# Patient Record
Sex: Male | Born: 1990 | Hispanic: No | Marital: Single | Smoking: Current every day smoker
Health system: Southern US, Community
[De-identification: ages and names within clinical notes are randomized; demographics above are authoritative.]

## PROBLEM LIST (undated history)

## (undated) DIAGNOSIS — T7840XA Allergy, unspecified, initial encounter: Secondary | ICD-10-CM

## (undated) DIAGNOSIS — B1920 Unspecified viral hepatitis C without hepatic coma: Secondary | ICD-10-CM

## (undated) HISTORY — DX: Allergy, unspecified, initial encounter: T78.40XA

---

## 2013-12-10 ENCOUNTER — Emergency Department (HOSPITAL_COMMUNITY)
Admission: EM | Admit: 2013-12-10 | Discharge: 2013-12-10 | Disposition: A | Discharge status: Home / Self Care | Payer: Self-pay | Attending: Emergency Medicine | Admitting: Emergency Medicine

## 2013-12-10 ENCOUNTER — Emergency Department (HOSPITAL_COMMUNITY): Payer: Self-pay

## 2013-12-10 ENCOUNTER — Encounter (HOSPITAL_COMMUNITY): Payer: Self-pay | Admitting: Emergency Medicine

## 2013-12-10 DIAGNOSIS — S62639A Displaced fracture of distal phalanx of unspecified finger, initial encounter for closed fracture: Secondary | ICD-10-CM | POA: Insufficient documentation

## 2013-12-10 DIAGNOSIS — S62639B Displaced fracture of distal phalanx of unspecified finger, initial encounter for open fracture: Secondary | ICD-10-CM

## 2013-12-10 DIAGNOSIS — S61319A Laceration without foreign body of unspecified finger with damage to nail, initial encounter: Secondary | ICD-10-CM

## 2013-12-10 DIAGNOSIS — Y9289 Other specified places as the place of occurrence of the external cause: Secondary | ICD-10-CM | POA: Insufficient documentation

## 2013-12-10 DIAGNOSIS — Y9389 Activity, other specified: Secondary | ICD-10-CM | POA: Insufficient documentation

## 2013-12-10 DIAGNOSIS — Z23 Encounter for immunization: Secondary | ICD-10-CM | POA: Insufficient documentation

## 2013-12-10 DIAGNOSIS — S6990XA Unspecified injury of unspecified wrist, hand and finger(s), initial encounter: Secondary | ICD-10-CM | POA: Insufficient documentation

## 2013-12-10 DIAGNOSIS — W208XXA Other cause of strike by thrown, projected or falling object, initial encounter: Secondary | ICD-10-CM | POA: Insufficient documentation

## 2013-12-10 DIAGNOSIS — S61209A Unspecified open wound of unspecified finger without damage to nail, initial encounter: Secondary | ICD-10-CM | POA: Insufficient documentation

## 2013-12-10 MED ORDER — HYDROMORPHONE HCL PF 1 MG/ML IJ SOLN
1.0000 mg | Freq: Once | INTRAMUSCULAR | Status: AC
  Administered 2013-12-10: 1 mg via INTRAVENOUS
  Filled 2013-12-10: qty 1

## 2013-12-10 MED ORDER — OXYCODONE-ACETAMINOPHEN 5-325 MG PO TABS: 1.0000 | ORAL_TABLET | ORAL | Status: DC | PRN

## 2013-12-10 MED ORDER — OXYCODONE-ACETAMINOPHEN 5-325 MG PO TABS
2.0000 | ORAL_TABLET | Freq: Once | ORAL | Status: AC
  Administered 2013-12-10: 2 via ORAL
  Filled 2013-12-10: qty 2

## 2013-12-10 MED ORDER — CEFAZOLIN SODIUM 1-5 GM-% IV SOLN
1.0000 g | Freq: Once | INTRAVENOUS | Status: AC
  Administered 2013-12-10: 1 g via INTRAVENOUS
  Filled 2013-12-10: qty 50

## 2013-12-10 MED ORDER — LIDOCAINE HCL 1 % IJ SOLN
INTRAMUSCULAR | Status: AC
  Administered 2013-12-10: 18:00:00
  Filled 2013-12-10: qty 20

## 2013-12-10 MED ORDER — CEPHALEXIN 500 MG PO CAPS: 500.0000 mg | ORAL_CAPSULE | Freq: Three times a day (TID) | ORAL | Status: DC

## 2013-12-10 MED ORDER — TETANUS-DIPHTH-ACELL PERTUSSIS 5-2.5-18.5 LF-MCG/0.5 IM SUSP
0.5000 mL | Freq: Once | INTRAMUSCULAR | Status: AC
  Administered 2013-12-10: 0.5 mL via INTRAMUSCULAR
  Filled 2013-12-10: qty 0.5

## 2013-12-10 NOTE — Consult Note (Signed)
ORTHOPAEDIC CONSULTATION HISTORY & PHYSICAL REQUESTING PHYSICIAN: Mirian Mo, MD  Chief Complaint: left long finger injury  HPI: Brent Bryant is a 23 y.o. 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 He was getting his more off the trailer, when a piece of steel pipe that was somewhat upright fell onto his left hand, smashing the tip. He has had his tetanus up dated, the digit has not been blocked, irrigated, nor evaluated with a bloodless field.  History reviewed. No pertinent past medical history. History reviewed. No pertinent past surgical history. History   Social History  . Marital Status: Single    Spouse Name: N/A    Number of Children: N/A  . Years of Education: N/A   Social History Main Topics  . Smoking status: Current Every Day Smoker  . Smokeless tobacco: None  . Alcohol Use: No  . Drug Use: Yes    Special: Marijuana  . Sexual Activity: None   Other Topics Concern  . None   Social History Narrative  . None   History reviewed. No pertinent family history. No Known Allergies Prior to Admission medications   Medication Sig Start Date End Date Taking? Authorizing Provider  ibuprofen (ADVIL,MOTRIN) 200 MG tablet Take 400 mg by mouth every 6 (six) hours as needed for headache.   Yes Historical Provider, MD  cephALEXin (KEFLEX) 500 MG capsule Take 1 capsule (500 mg total) by mouth 3 (three) times daily. 12/10/13   Jodi Marble, MD  oxyCODONE-acetaminophen (ROXICET) 5-325 MG per tablet Take 1-2 tablets by mouth every 4 (four) hours as needed. 12/10/13   Jodi Marble, MD   Dg Finger Middle Left  12/10/2013   CLINICAL DATA:  Crush injury to distal long finger  EXAM: LEFT MIDDLE FINGER 2+V  COMPARISON:  None.  FINDINGS: Comminuted fracture of the tuft of the third distal phalanx. There is associated irregularity of the overlying soft tissues concerning for open fracture. Remainder the visualized bones and joints are intact and unremarkable. Normal bony mineralization. No  lytic or blastic osseous lesion.  IMPRESSION: Comminuted fracture of the tuft of the third distal phalanx with associated soft tissue injury concerning for open fracture.   Electronically Signed   By: Malachy Moan M.D.   On: 12/10/2013 15:53    Positive ROS: All other systems have been reviewed and were otherwise negative with the exception of those mentioned in the HPI and as above.  Physical Exam: Vitals: Refer to EMR. Constitutional:  WD, WN, NAD HEENT:  NCAT, EOMI Neuro/Psych:  Alert & oriented to person, place, and time; appropriate mood & affect Lymphatic: No generalized extremity edema or lymphadenopathy Extremities / MSK:  The extremities are normal with respect to appearance, ranges of motion, joint stability, muscle strength/tone, sensation, & perfusion except as otherwise noted:  There is a transverse laceration across the nailbed, midway between the base of the tip. It traverses the sterile matrix. It extends into the ulnar nail fold. There are smashed bone fragments of the tuft within the tip. There is no nail plate present.  Assessment: Left long finger tip smashed with nailbed/skin lacerations and underlying comminuted tuft fracture  Plan: I instilled a digital block with plain lidocaine. Applied a tourniquet to the base digit and then washed copiously and a running water. After evaluating the extent of damage, prepped the finger with Betadine and repaired the soft tissues to include the nail bed and the adjacent normal skin with 6-0 chromic. Tourniquet was released, a dressing with a tongue blade  splint applied, and I ordered a dose of IV antibiotics. He'll be discharged with pain medicines, work restrictions, oral antibiotics for 5 days, and with followup planned for next week.  Cliffton Astersavid A. Janee Mornhompson, MD      [facility]thopaedic & Hand Surgery [facility] [address] [place], [place]  [id number] Office: [phone] Mobile: [phone]

## 2013-12-10 NOTE — ED Notes (Signed)
Pt yelling and screaming for pain meds.  Pt was refusing IV needle and wanted IM short. RN able to convince for IV meds.

## 2013-12-10 NOTE — ED Notes (Signed)
Pt states while at work, sharp "plate" fell on left middle finger.  Laceration.  Bleeding controlled.

## 2013-12-10 NOTE — ED Provider Notes (Signed)
CSN: [id number]     Arrival date & time 12/10/13  1447 History   First MD Initiated Contact with Patient 12/10/13 1455     Chief Complaint  Patient presents with  . Hand Injury     (Consider location/radiation/quality/duration/timing/severity/associated sxs/prior Treatment) Patient is a 23 y.o. male presenting with hand injury. The history is provided by the patient and medical records.  Hand Injury  This is a 23 y.o. M with no significant PMH presenting to the ED for left middle finger injury that occurred just PTA.  Pt was at work when a metal pipe fell onto his distal left middle finger.  He sustained a laceration to distal finger, crossing through fingernail.  He denies numbness or paresthesias of finger, but significant pain.  Patient is right hand dominant.  Last PO intake around 1300 this afternoon.  Date of last tetanus unknown.  No past medical history on file. No past surgical history on file. No family history on file. History  Substance Use Topics  . Smoking status: Not on file  . Smokeless tobacco: Not on file  . Alcohol Use: Not on file    Review of Systems  Musculoskeletal: Positive for arthralgias.  Skin: Positive for wound.  All other systems reviewed and are negative.     Allergies  Review of patient's allergies indicates not on file.  Home Medications   Prior to Admission medications   Not on File   BP 114/86  Pulse 83  Temp(Src) 97.6 F (36.4 C) (Oral)  Resp 18  SpO2 100%  Physical Exam  Nursing note and vitals reviewed. Constitutional: He is oriented to person, place, and time. He appears well-developed and well-nourished. No distress.  HENT:  Head: Normocephalic and atraumatic.  Mouth/Throat: Oropharynx is clear and moist.  Eyes: Conjunctivae and EOM are normal. Pupils are equal, round, and reactive to light.  Neck: Normal range of motion. Neck supple.  Cardiovascular: Normal rate, regular rhythm and normal heart sounds.     Pulmonary/Chest: Effort normal and breath sounds normal. No respiratory distress. He has no wheezes.  Musculoskeletal: Normal range of motion.       Left hand: He exhibits tenderness and laceration. He exhibits normal range of motion, normal capillary refill and no deformity. Normal sensation noted.       Hands:  2cm laceration of dorsal distal left middle finger through fingernail; no gross bony deformity; full flexion/extension of finger maintained; hand NVI  Neurological: He is alert and oriented to person, place, and time.  Skin: Skin is warm and dry. He is not diaphoretic.  Psychiatric: He has a normal mood and affect.    ED Course  Procedures (including critical care time) Labs Review Labs Reviewed - No data to display  Imaging Review Dg Finger Middle Left  12/10/2013   CLINICAL DATA:  Crush injury to distal long finger  EXAM: LEFT MIDDLE FINGER 2+V  COMPARISON:  None.  FINDINGS: Comminuted fracture of the tuft of the third distal phalanx. There is associated irregularity of the overlying soft tissues concerning for open fracture. Remainder the visualized bones and joints are intact and unremarkable. Normal bony mineralization. No lytic or blastic osseous lesion.  IMPRESSION: Comminuted fracture of the tuft of the third distal phalanx with associated soft tissue injury concerning for open fracture.   Electronically Signed   By: Malachy Moan M.D.   On: 12/10/2013 15:53     EKG Interpretation None      MDM   Final diagnoses:  Open fracture of tuft of distal phalanx of finger, initial encounter  Nailbed laceration, finger, initial encounter   Tetanus updated. X-ray revealing comminuted tuft fracture. Given overlying laceration through nail bed, concern for open fracture. On call hand surgery, Dr. Janee Mornhompson, was consulted-- he has evaluated pt in the ED and performed repair.  Ancef given in ED.  Patient will be discharged home with keflex and pain meds.  Will FU in office next  week with Dr. Janee Mornhompson.  Discussed plan with patient, he/she acknowledged understanding and agreed with plan of care.  Return precautions given for new or worsening symptoms.  Garlon HatchetLisa M Kijuana Ruppel, PA-C 12/10/13 2249

## 2013-12-10 NOTE — Discharge Instructions (Signed)
Keep bandage on, clean and dry until you return to the office. No heavy work with left hand

## 2013-12-10 NOTE — Progress Notes (Signed)
[facility] Community Liaison Stacy,  ° °Provided pt with a list of primary care resources and a [facility] Card application to help patient establish primary care.  °

## 2013-12-10 NOTE — ED Provider Notes (Signed)
Medical screening examination/treatment/procedure(s) were conducted as a shared visit with non-physician practitioner(s) and myself.  I personally evaluated the patient during the encounter.  CV/pulm/abd exam unremarkable.  MSK with nailbed lac, consistent with Tufts fx.  Hand consulted. Will fu with Hand    EKG Interpretation None          Mirian MoMatthew Emori Mumme, MD 12/11/13 1325

## 2013-12-14 NOTE — ED Provider Notes (Signed)
Medical screening examination/treatment/procedure(s) were conducted as a shared visit with non-physician practitioner(s) and myself.  I personally evaluated the patient during the encounter.   EKG Interpretation None       I performed an examination on the patient including cardiac, pulmonary, and gi systems which were unremarkable.  He presents today with a nailbed laceration.  Hand was consulted and dispo'ed the patient.  DC home in stable condition.    Mirian MoMatthew Arlin Savona, MD 12/14/13 [phone]

## 2014-10-05 ENCOUNTER — Emergency Department (HOSPITAL_COMMUNITY)
Admission: EM | Admit: 2014-10-05 | Discharge: 2014-10-05 | Disposition: A | Discharge status: Home / Self Care | Payer: Medicaid Other | Attending: Emergency Medicine | Admitting: Emergency Medicine

## 2014-10-05 ENCOUNTER — Encounter (HOSPITAL_COMMUNITY): Payer: Self-pay | Admitting: Emergency Medicine

## 2014-10-05 ENCOUNTER — Emergency Department (HOSPITAL_COMMUNITY): Payer: Medicaid Other

## 2014-10-05 DIAGNOSIS — S99922A Unspecified injury of left foot, initial encounter: Secondary | ICD-10-CM | POA: Insufficient documentation

## 2014-10-05 DIAGNOSIS — Z8781 Personal history of (healed) traumatic fracture: Secondary | ICD-10-CM | POA: Diagnosis not present

## 2014-10-05 DIAGNOSIS — S99912A Unspecified injury of left ankle, initial encounter: Secondary | ICD-10-CM | POA: Insufficient documentation

## 2014-10-05 DIAGNOSIS — Y939 Activity, unspecified: Secondary | ICD-10-CM | POA: Insufficient documentation

## 2014-10-05 DIAGNOSIS — Z720 Tobacco use: Secondary | ICD-10-CM | POA: Insufficient documentation

## 2014-10-05 DIAGNOSIS — Y929 Unspecified place or not applicable: Secondary | ICD-10-CM | POA: Diagnosis not present

## 2014-10-05 DIAGNOSIS — Y999 Unspecified external cause status: Secondary | ICD-10-CM | POA: Diagnosis not present

## 2014-10-05 DIAGNOSIS — X58XXXA Exposure to other specified factors, initial encounter: Secondary | ICD-10-CM | POA: Diagnosis not present

## 2014-10-05 MED ORDER — IBUPROFEN 800 MG PO TABS
800.0000 mg | ORAL_TABLET | Freq: Once | ORAL | Status: AC
Start: 2014-10-05 — End: 2014-10-05
  Administered 2014-10-05: 800 mg via ORAL
  Filled 2014-10-05: qty 1

## 2014-10-05 NOTE — ED Notes (Signed)
Pt c/o L foot pain x 1 week. [place] is from [place] and broke a bone in his foot about 1 month ago. Pt was driving down here and got caught in the rain which he sts "warped" his cast. Pt removed the cast and has had pain in his foot ever since. Pt A&Ox4 and ambulatory. Pt has good ROM and can move toes. Denies numbness and tingling. Pt had an appointment in July to get cast removed.

## 2014-10-05 NOTE — ED Provider Notes (Signed)
CSN: [id number]     Arrival date & time 10/05/14  1110 History  This chart was scribed for [name], PA-C, working with Samuel JesterKathleen McManus, DO by Chestine SporeSoijett Blue, ED Scribe. The patient was seen in room WTR9/WTR9 at 12:06 PM.    Chief Complaint  Patient presents with  . Foot Injury      The history is provided by the patient. No language interpreter was used.     HPI Comments: Brent Bryant is a 24 y.o. male who presents to the Emergency Department complaining of left foot injury onset 1 week. He reports that he is from [place] and he broke his foot a couple weeks ago and he removed his cast because it got wet in the rain. Pt has an appointment 11/03/14 to get the cast removed. Pt has been ambulatory but not without pain. He states that he is having associated symptoms of mild joint swelling. He denies numbness, tingling, and any other symptoms.  History reviewed. No pertinent past medical history. History reviewed. No pertinent past surgical history. No family history on file. History  Substance Use Topics  . Smoking status: Current Every Day Smoker  . Smokeless tobacco: Not on file  . Alcohol Use: No    Review of Systems  Musculoskeletal: Positive for joint swelling (mild swelling of the left ankle) and arthralgias (left foot and ankle).  Skin: Negative for color change and wound.  Neurological: Negative for numbness.      Allergies  Vicodin  Home Medications   Prior to Admission medications   Medication Sig Start Date End Date Taking? Authorizing Provider  oxyCODONE-acetaminophen (ROXICET) 5-325 MG per tablet Take 1-2 tablets by mouth every 4 (four) hours as needed. Patient taking differently: Take 1-2 tablets by mouth every 4 (four) hours as needed for moderate pain.  12/10/13  Yes Mack Hookavid Thompson, MD  cephALEXin (KEFLEX) 500 MG capsule Take 1 capsule (500 mg total) by mouth 3 (three) times daily. Patient not taking: Reported on 10/05/2014 12/10/13   Mack Hookavid Thompson, MD    BP 111/76 mmHg  Pulse 87  Temp(Src) 97.9 F (36.6 C) (Oral)  Resp 18  Ht 5\' 9"  (1.753 m)  Wt 140 lb (63.504 kg)  BMI 20.67 kg/m2  SpO2 100% Physical Exam  Constitutional: He is oriented to person, place, and time. He appears well-developed and well-nourished. No distress.  HENT:  Head: Normocephalic and atraumatic.  Eyes: EOM are normal.  Neck: Neck supple. No tracheal deviation present.  Cardiovascular: Normal rate.    Pulses intact.  Pulmonary/Chest: Effort normal. No respiratory distress.  Musculoskeletal:       Left lower leg: He exhibits tenderness.       Left foot: There is decreased range of motion, tenderness and swelling (minimal).  Tenderness along the distal tibia and dorsum of the left foot. Tender in the heel. Minimal swelling, no stepoff sensation.  ROM limited due to pain.  Neurological: He is alert and oriented to person, place, and time.  Skin: Skin is warm and dry.  Psychiatric: He has a normal mood and affect. His behavior is normal.  Nursing note and vitals reviewed.   ED Course  Procedures (including critical care time) DIAGNOSTIC STUDIES: Oxygen Saturation is 100% on RA, nl by my interpretation.    COORDINATION OF CARE: 12:09 PM-Discussed treatment plan which includes left foot x-ray and left ankle x-ray with pt at bedside and pt agreed to plan.   Labs Review Labs Reviewed - No data to display  Imaging Review Dg Ankle Complete Left  10/05/2014   CLINICAL DATA:  History of prior fracture. Removed cast because ago wet. Dorsal foot pain.  EXAM: LEFT FOOT - COMPLETE 3+ VIEW; LEFT ANKLE COMPLETE - 3+ VIEW  COMPARISON:  None.  FINDINGS: The joint spaces are maintained. No bullet or ankle fractures are identified.  IMPRESSION: No acute bony findings.   Electronically Signed   By: Rudie Meyer M.D.   On: 10/05/2014 13:04   Dg Foot Complete Left  10/05/2014   CLINICAL DATA:  History of prior fracture. Removed cast because ago wet. Dorsal foot pain.  EXAM:  LEFT FOOT - COMPLETE 3+ VIEW; LEFT ANKLE COMPLETE - 3+ VIEW  COMPARISON:  None.  FINDINGS: The joint spaces are maintained. No bullet or ankle fractures are identified.  IMPRESSION: No acute bony findings.   Electronically Signed   By: Rudie Meyer M.D.   On: 10/05/2014 13:04     EKG Interpretation None      MDM   Final diagnoses:  None    Patient X-Ray negative for obvious fracture or dislocation. Pain managed in ED. Pt advised to follow up with orthopedics if symptoms persist for possibility of missed fracture diagnosis. Patient given brace while in ED, conservative therapy recommended and discussed. Patient will be dc home & is agreeable with above plan.   I personally performed the services described in this documentation, which was scribed in my presence. The recorded information has been reviewed and is accurate.      Arthor Captain, PA-C 10/05/14 1316  Samuel Jester, DO 10/09/14 1453

## 2014-10-05 NOTE — Discharge Instructions (Signed)

## 2016-01-07 ENCOUNTER — Emergency Department (HOSPITAL_COMMUNITY)
Admission: EM | Admit: 2016-01-07 | Discharge: 2016-01-07 | Disposition: A | Discharge status: Home / Self Care | Payer: Medicaid Other | Attending: Emergency Medicine | Admitting: Emergency Medicine

## 2016-01-07 ENCOUNTER — Encounter (HOSPITAL_COMMUNITY): Payer: Self-pay | Admitting: Emergency Medicine

## 2016-01-07 DIAGNOSIS — F17210 Nicotine dependence, cigarettes, uncomplicated: Secondary | ICD-10-CM | POA: Insufficient documentation

## 2016-01-07 DIAGNOSIS — L0231 Cutaneous abscess of buttock: Secondary | ICD-10-CM | POA: Insufficient documentation

## 2016-01-07 MED ORDER — SULFAMETHOXAZOLE-TRIMETHOPRIM 800-160 MG PO TABS: 1.0000 | ORAL_TABLET | Freq: Two times a day (BID) | ORAL | 0 refills | Status: DC

## 2016-01-07 NOTE — Discharge Instructions (Signed)
Keep wound clean and dry. Apply warm compresses to affected area throughout the day, or perform warm sitz baths, at least 4 times daily. Take antibiotic until it is finished. Take motrin as needed for pain. [facility] in 5-7 days for wound recheck and to establish medical care. Monitor area for signs of infection to include, but not limited to: increasing pain, spreading redness, drainage/pus, worsening swelling, or fevers. Return to emergency department for emergent changing or worsening symptoms.

## 2016-01-07 NOTE — ED Provider Notes (Signed)
[facility]-EMERGENCY DEPT Provider Note   CSN: [id number] Arrival date & time: 01/07/16  1632  By signing my name below, I, Soijett Blue, attest that this documentation has been prepared under the direction and in the presence of Levi StraussMercedes Camprubi-Soms, VF CorporationPA-C Electronically Signed: Soijett Blue, ED Scribe. 01/07/16. 4:58 PM.   History   Chief Complaint Chief Complaint  Patient presents with  . Abscess    l/buttock x 2 days    HPI Brent Bryant is a 25 y.o. male who presents to the Emergency Department complaining of abscess to left buttocks onset yesterday. Pt describes his left buttock abscess pain as 7-8/10, constant, aching/throbbing, and it doesn't radiate. He states that palpation and ambulation worsens his pain and he denies any alleviating factors. He reports that he has not tried any medications for the relief of his symptoms. Pt notes that he has had an abscess before, with his most recent one being last month. Pt is having associated symptoms of warmth and swelling to left buttock abscess. Denies redness (although he can't see the area so he's not sure), red streaking near the area, drainage from area, fevers, chills, CP, SOB, abdominal pain, nausea, vomiting, diarrhea, constipation, dysuria, hematuria, numbness, tingling, weakness, or any other symptoms. He's not sure if he's had any ingrown hairs or skin injuries to the area. Denies IV drug use. Pt is allergic to tylenol and his reaction is a rash, no other known allergies. Denies having a PCP at this time.   The history is provided by the patient. No language interpreter was used.  Abscess  Location:  Pelvis Pelvic abscess location:  L buttock and gluteal cleft Size:  1 cm Abscess quality: fluctuance, painful and warmth   Abscess quality: not draining and no redness   Red streaking: no   Duration:  1 day Progression:  Unchanged Pain details:    Quality:  Throbbing and aching   Severity:  Moderate   Duration:  1 day   Timing:   Constant   Progression:  Unchanged Chronicity:  New Context: not diabetes, not immunosuppression, not injected drug use, not insect bite/sting and not skin injury   Relieved by:  None tried Exacerbated by: ambulation and palpation. Ineffective treatments:  None tried Associated symptoms: no fever, no nausea and no vomiting   Risk factors: prior abscess     History reviewed. No pertinent past medical history.  There are no active problems to display for this patient.   History reviewed. No pertinent surgical history.     Home Medications    Prior to Admission medications   Medication Sig Start Date End Date Taking? Authorizing Provider  cephALEXin (KEFLEX) 500 MG capsule Take 1 capsule (500 mg total) by mouth 3 (three) times daily. Patient not taking: Reported on 10/05/2014 12/10/13   Mack Hookavid Thompson, MD  oxyCODONE-acetaminophen (ROXICET) 5-325 MG per tablet Take 1-2 tablets by mouth every 4 (four) hours as needed. Patient taking differently: Take 1-2 tablets by mouth every 4 (four) hours as needed for moderate pain.  12/10/13   Mack Hookavid Thompson, MD    Family History No family history on file.  Social History Social History  Substance Use Topics  . Smoking status: Current Every Day Smoker    Types: Cigarettes  . Smokeless tobacco: Never Used  . Alcohol use No     Allergies   Tylenol [acetaminophen] and Vicodin [hydrocodone-acetaminophen]   Review of Systems Review of Systems  Constitutional: Negative for chills and fever.  Respiratory: Negative for shortness  of breath.   Cardiovascular: Negative for chest pain.  Gastrointestinal: Negative for abdominal pain, constipation, diarrhea, nausea and vomiting.  Genitourinary: Negative for dysuria and hematuria.  Musculoskeletal: Positive for myalgias (L buttock abscess pain). Negative for arthralgias.  Skin: Positive for wound (abscess to L buttock). Negative for color change (unsure).  Allergic/Immunologic: Negative for  immunocompromised state.  Neurological: Negative for weakness and numbness.  Psychiatric/Behavioral: Negative for confusion.  10 Systems reviewed and are negative for acute change except as noted in the HPI.    Physical Exam Updated Vital Signs BP 111/85 (BP Location: Right Arm)   Pulse 100   Temp 98.5 F (36.9 C) (Oral)   Resp 18   SpO2 100%   Physical Exam  Constitutional: He is oriented to person, place, and time. Vital signs are normal. He appears well-developed and well-nourished.  Non-toxic appearance. No distress.  Afebrile, nontoxic, NAD  HENT:  Head: Normocephalic and atraumatic.  Mouth/Throat: Mucous membranes are normal.  Eyes: Conjunctivae and EOM are normal. Right eye exhibits no discharge. Left eye exhibits no discharge.  Neck: Normal range of motion. Neck supple.  Cardiovascular: Normal rate and intact distal pulses.   Pulmonary/Chest: Effort normal. No respiratory distress.  Abdominal: Normal appearance. He exhibits no distension.  Genitourinary:  Genitourinary Comments: Chaperone present for exam. Left buttock at intergluteal cleft with approximately 1 cm abscess which spontaneously ruptured during exam, mild fluctuance, able to express purulent drainage from the area, mild induration just around the abscess but no spreading cellulitis, mildly erythematous and warm to touch, doesn't extend toward rectum.   Musculoskeletal: Normal range of motion.  Neurological: He is alert and oriented to person, place, and time. He has normal strength. No sensory deficit.  Skin: Skin is warm, dry and intact. No rash noted. There is erythema.  Left buttock abscess as mentioned above.   Psychiatric: He has a normal mood and affect.  Nursing note and vitals reviewed.    ED Treatments / Results  DIAGNOSTIC STUDIES: Oxygen Saturation is 100% on RA, nl by my interpretation.    COORDINATION OF CARE: 4:54 PM Discussed treatment plan with pt at bedside which includes abx Rx, warm  compresses, motrin PRN, referral and follow up with [facility] in 1 week, and pt agreed to plan.   Procedures Procedures (including critical care time)  Medications Ordered in ED Medications - No data to display   Initial Impression / Assessment and Plan / ED Course  I have reviewed the triage vital signs and the nursing notes.   Clinical Course    25 y.o. male here with L buttock abscess x1 day, spontaneously ruptured when he lifted his buttock to show me the area. Able to express some purulent material, minimal fluctuance which was expressible. Mildly indurated just around the abscess, no spreading cellulitis, no communication to the rectum. Discussed that we'll start on bactrim to ensure resolution, but no further I&D needed since it's draining well on its own. Heat and/or sitz baths discussed for pain, tylenol/motrin, f/up with [facility] in 5-7 days for recheck and to establish medical care. I explained the diagnosis and have given explicit precautions to return to the ER including for any other new or worsening symptoms. The patient understands and accepts the medical plan as it's been dictated and I have answered their questions. Discharge instructions concerning home care and prescriptions have been given. The patient is STABLE and is discharged to home in good condition.   Final Clinical Impressions(s) /  ED Diagnoses   Final diagnoses:  Left buttock abscess    New Prescriptions New Prescriptions   SULFAMETHOXAZOLE-TRIMETHOPRIM (BACTRIM DS,SEPTRA DS) 800-160 MG TABLET    Take 1 tablet by mouth 2 (two) times daily.   I personally performed the services described in this documentation, which was scribed in my presence. The recorded information has been reviewed and is accurate.     Stephen Baruch Camprubi-Soms, PA-C 01/07/16 1707    Benjiman Core, MD 01/08/16 [id number]

## 2016-01-07 NOTE — ED Triage Notes (Signed)
Pt reports pain and tenderness in red raised area on l/buttock. Pain and tenderness noted yesterday and increased today

## 2018-09-25 ENCOUNTER — Emergency Department (HOSPITAL_COMMUNITY): Payer: Self-pay

## 2018-09-25 ENCOUNTER — Emergency Department (HOSPITAL_COMMUNITY)
Admission: EM | Admit: 2018-09-25 | Discharge: 2018-09-25 | Disposition: A | Discharge status: Home / Self Care | Payer: Self-pay | Attending: Emergency Medicine | Admitting: Emergency Medicine

## 2018-09-25 ENCOUNTER — Encounter (HOSPITAL_COMMUNITY): Payer: Self-pay

## 2018-09-25 ENCOUNTER — Other Ambulatory Visit: Payer: Self-pay

## 2018-09-25 ENCOUNTER — Other Ambulatory Visit (HOSPITAL_COMMUNITY): Payer: Self-pay

## 2018-09-25 DIAGNOSIS — R945 Abnormal results of liver function studies: Secondary | ICD-10-CM | POA: Insufficient documentation

## 2018-09-25 DIAGNOSIS — R7989 Other specified abnormal findings of blood chemistry: Secondary | ICD-10-CM

## 2018-09-25 DIAGNOSIS — R590 Localized enlarged lymph nodes: Secondary | ICD-10-CM | POA: Insufficient documentation

## 2018-09-25 DIAGNOSIS — F17210 Nicotine dependence, cigarettes, uncomplicated: Secondary | ICD-10-CM | POA: Insufficient documentation

## 2018-09-25 LAB — COMPREHENSIVE METABOLIC PANEL
ALT: 126 U/L — ABNORMAL HIGH (ref 0–44)
AST: 94 U/L — ABNORMAL HIGH (ref 15–41)
Albumin: 4.5 g/dL (ref 3.5–5.0)
Alkaline Phosphatase: 55 U/L (ref 38–126)
Anion gap: 6 (ref 5–15)
BUN: 10 mg/dL (ref 6–20)
CO2: 26 mmol/L (ref 22–32)
Calcium: 9.2 mg/dL (ref 8.9–10.3)
Chloride: 104 mmol/L (ref 98–111)
Creatinine, Ser: 0.87 mg/dL (ref 0.61–1.24)
GFR calc Af Amer: >60 mL/min (ref >60)
GFR calc non Af Amer: >60 mL/min (ref >60)
Glucose, Bld: 119 mg/dL — ABNORMAL HIGH (ref 70–99)
Potassium: 3.9 mmol/L (ref 3.5–5.1)
Sodium: 136 mmol/L (ref 135–145)
Total Bilirubin: 0.5 mg/dL (ref 0.3–1.2)
Total Protein: 8 g/dL (ref 6.5–8.1)

## 2018-09-25 LAB — URINALYSIS, ROUTINE W REFLEX MICROSCOPIC
Bilirubin Urine: NEGATIVE
Glucose, UA: NEGATIVE mg/dL
Hgb urine dipstick: NEGATIVE
Ketones, ur: NEGATIVE mg/dL
Leukocytes,Ua: NEGATIVE
Nitrite: NEGATIVE
Protein, ur: NEGATIVE mg/dL
Specific Gravity, Urine: >1.046 — ABNORMAL HIGH (ref 1.005–1.030)
pH: 7 (ref 5.0–8.0)

## 2018-09-25 LAB — CBC WITH DIFFERENTIAL/PLATELET
Abs Immature Granulocytes: 0.02 10*3/uL (ref 0.00–0.07)
Basophils Absolute: 0 10*3/uL (ref 0.0–0.1)
Basophils Relative: 1 %
Eosinophils Absolute: 0.1 10*3/uL (ref 0.0–0.5)
Eosinophils Relative: 2 %
HCT: 43.6 % (ref 39.0–52.0)
Hemoglobin: 14.5 g/dL (ref 13.0–17.0)
Immature Granulocytes: 0 %
Lymphocytes Relative: 27 %
Lymphs Abs: 1.4 10*3/uL (ref 0.7–4.0)
MCH: 30.5 pg (ref 26.0–34.0)
MCHC: 33.3 g/dL (ref 30.0–36.0)
MCV: 91.8 fL (ref 80.0–100.0)
Monocytes Absolute: 0.5 10*3/uL (ref 0.1–1.0)
Monocytes Relative: 10 %
Neutro Abs: 2.9 10*3/uL (ref 1.7–7.7)
Neutrophils Relative %: 60 %
Platelets: 200 10*3/uL (ref 150–400)
RBC: 4.75 MIL/uL (ref 4.22–5.81)
RDW: 14.5 % (ref 11.5–15.5)
WBC: 4.9 10*3/uL (ref 4.0–10.5)
nRBC: 0 % (ref 0.0–0.2)

## 2018-09-25 LAB — LIPASE, BLOOD: Lipase: 28 U/L (ref 11–51)

## 2018-09-25 MED ORDER — IOHEXOL 300 MG/ML  SOLN
100.0000 mL | Freq: Once | INTRAMUSCULAR | Status: AC | PRN
  Administered 2018-09-25: 100 mL via INTRAVENOUS

## 2018-09-25 MED ORDER — ONDANSETRON HCL 4 MG/2ML IJ SOLN: 4.0000 mg | Freq: Once | INTRAMUSCULAR | Status: DC

## 2018-09-25 MED ORDER — SODIUM CHLORIDE (PF) 0.9 % IJ SOLN
INTRAMUSCULAR | Status: AC
  Filled 2018-09-25: qty 50

## 2018-09-25 MED ORDER — HYDROMORPHONE HCL 1 MG/ML IJ SOLN
0.5000 mg | Freq: Once | INTRAMUSCULAR | Status: AC
  Administered 2018-09-25: 0.5 mg via INTRAVENOUS
  Filled 2018-09-25: qty 1

## 2018-09-25 NOTE — Discharge Instructions (Signed)
You can alternate 600 mg of ibuprofen and [phone] mg of Tylenol every 3-6 hours as needed for pain. Do not exceed 4000 mg of Tylenol daily.  Take ibuprofen with food to avoid upset stomach issues.  You may find some relief by applying ice or heat for 20 minutes at a time a few times a day.  Your work-up showed evidence of enlarged lymph nodes on the right.  You did have mildly abnormal liver tests but I do not think this is related and your imaging did not show any signs of liver or gallbladder issues.  The rest of your tests were normal.  Follow up with a primary care physician for reevaluation of both.  They may want to do additional testing or imaging.  I would avoid drinking any alcohol as this can make liver problems worse.  Return to the emergency department if any concerning signs or symptoms develop such as fevers, persistent vomiting, or severe pain.

## 2018-09-25 NOTE — ED Provider Notes (Signed)
[facility]-EMERGENCY DEPT Provider Note   CSN: [id number] Arrival date & time: 09/25/18  7829    History   Chief Complaint Chief Complaint  Patient presents with   Groin Pain   Groin Swelling    HPI Brent Bryant is a 28 y.o. male presents for evaluation of acute onset, constant right groin pain beginning 4 days ago.  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  He then noted a "bulge" to the right groin which he believes to be a hernia.  Reports pain of varying severity which worsens with certain movements and palpation. Pain radiates up the right lower side of the abdomen.  He attempted to "push it back in" while taking a hot shower but was unsuccessful.  Has not tried anything else for his symptoms.  He denies fever, chills, nausea, vomiting, diarrhea, constipation, testicular pain or swelling, or urinary symptoms.     The history is provided by the patient.    History reviewed. No pertinent past medical history.  There are no active problems to display for this patient.   History reviewed. No pertinent surgical history.      Home Medications    Prior to Admission medications   Medication Sig Start Date End Date Taking? Authorizing Provider  ibuprofen (ADVIL) 200 MG tablet Take 200 mg by mouth every 6 (six) hours as needed for moderate pain.   Yes [provider]    Family History History reviewed. No pertinent family history.  Social History Social History   Tobacco Use   Smoking status: Current Every Day Smoker    Types: Cigarettes   Smokeless tobacco: Never Used  Substance Use Topics   Alcohol use: No   Drug use: Yes    Types: Marijuana     Allergies   Tylenol [acetaminophen] and Vicodin [hydrocodone-acetaminophen]   Review of Systems Review of Systems  Constitutional: Negative for chills and fever.    Respiratory: Negative for shortness of breath.   Cardiovascular: Negative for chest pain.  Gastrointestinal: Positive for abdominal pain. Negative for constipation, diarrhea, nausea and vomiting.  Genitourinary: Negative for frequency, hematuria and urgency.  All other systems reviewed and are negative.    Physical Exam Updated Vital Signs BP (!) 118/92    Pulse 65    Temp 98.5 F (36.9 C) (Oral)    Resp 16    SpO2 100%   Physical Exam Vitals signs and nursing note reviewed. Exam conducted with a chaperone present.  Constitutional:      General: He is not in acute distress.    Appearance: He is well-developed.  HENT:     Head: Normocephalic and atraumatic.  Eyes:     General:        Right eye: No discharge.        Left eye: No discharge.     Conjunctiva/sclera: Conjunctivae normal.  Neck:     Vascular: No JVD.     Trachea: No tracheal deviation.  Cardiovascular:     Rate and Rhythm: Normal rate.  Pulmonary:     Effort: Pulmonary effort is normal.  Abdominal:     General: Abdomen is flat. Bowel sounds are increased. There is no distension.     Palpations: Abdomen is soft.     Tenderness: There is abdominal tenderness in the right lower quadrant. There is no right  CVA tenderness, left CVA tenderness, guarding or rebound. Negative signs include Murphy's sign and McBurney's sign.     Hernia: No hernia is present.     Comments: Right inguinal mass, 2cm, slightly mobile, tender to palpation. No erythema or induration.   Genitourinary:    Penis: Normal.      Scrotum/Testes:        Right: Tenderness or swelling not present.        Left: Tenderness or swelling not present.     Epididymis:     Right: No tenderness.     Left: No tenderness.     Comments: Chaperone RN Bowen present Skin:    General: Skin is warm and dry.     Findings: No erythema.  Neurological:     Mental Status: He is alert.  Psychiatric:        Behavior: Behavior normal.      ED Treatments / Results   Labs (all labs ordered are listed, but only abnormal results are displayed) Labs Reviewed  COMPREHENSIVE METABOLIC PANEL - Abnormal; Notable for the following components:      Result Value   Glucose, Bld 119 (*)    AST 94 (*)    ALT 126 (*)    All other components within normal limits  URINALYSIS, ROUTINE W REFLEX MICROSCOPIC - Abnormal; Notable for the following components:   Specific Gravity, Urine >1.046 (*)    All other components within normal limits  CBC WITH DIFFERENTIAL/PLATELET  LIPASE, BLOOD  GC/CHLAMYDIA PROBE AMP ([place]) NOT AT [facility]    EKG None  Radiology CLINICAL DATA:  28 year old male with a history of right inguinal pain and possible hernia  EXAM: CT ABDOMEN AND PELVIS WITH CONTRAST  TECHNIQUE: Multidetector CT imaging of the abdomen and pelvis was performed using the standard protocol following bolus administration of intravenous contrast.  CONTRAST:  100mL OMNIPAQUE IOHEXOL 300 MG/ML  SOLN  COMPARISON:  None.  FINDINGS: Lower chest: No acute abnormality.  Hepatobiliary: Unremarkable liver.  Unremarkable gallbladder  Pancreas: Unremarkable  Spleen: Unremarkable  Adrenals/Urinary Tract: Unremarkable appearance of the adrenal glands. No evidence of hydronephrosis of the right or left kidney. No nephrolithiasis. Unremarkable course of the bilateral ureters. Unremarkable appearance of the urinary bladder.  Stomach/Bowel: Unremarkable stomach. Unremarkable small bowel. Appendix is not visualized, however, no inflammatory changes are present adjacent to the cecum to indicate an appendicitis. Unremarkable colon.  Vascular/Lymphatic: Unremarkable vasculature.  Right inguinal lymph nodes, asymmetric from the left, borderline enlarged with the index nodes measuring as large as 11 mm in short axis dimension. Right external iliac node on image 72 of series 2, borderline enlarged. No retroperitoneal adenopathy.  Reproductive:  Unremarkable  Other: No ventral hernia.  No inguinal hernia.  Musculoskeletal: No acute displaced fracture.  IMPRESSION: Negative for acute intra-abdominal finding.  Borderline enlarged right inguinal lymph nodes, with additional borderline enlarged right external iliac node. These may be reactive, however, lymphoproliferative disorder cannot be excluded and correlation with presentation and lab values may be useful.  No evidence of inguinal hernia.   Electronically Signed   By: Gilmer MorJaime  Wagner D.O.   On: 09/25/2018 11:21 Procedures Procedures (including critical care time)  Medications Ordered in ED Medications  HYDROmorphone (DILAUDID) injection 0.5 mg (0.5 mg Intravenous Given 09/25/18 0953)  iohexol (OMNIPAQUE) 300 MG/ML solution 100 mL (100 mLs Intravenous Contrast Given 09/25/18 1056)     Initial Impression / Assessment and Plan / ED Course  I have reviewed the triage vital signs and  the nursing notes.  Pertinent labs & imaging results that were available during my care of the patient were reviewed by me and considered in my medical decision making (see chart for details).        Patient presenting for evaluation of a "bulge" to the right groin.  The area itself is tender to palpation and he has some mild right lower quadrant tenderness as well.  The mass is firm, mobile with no signs of secondary skin infection.  The patient himself is afebrile, vital signs are stable.  He is nontoxic in appearance.  No constitutional symptoms.  Lab work reviewed by me shows no leukocytosis, no anemia, no metabolic derangements.  His LFTs are mildly elevated however he has no right upper quadrant tenderness and his CT scan shows no evidence of cholecystitis, cholelithiasis, cirrhosis, or other hepatobiliary abnormalities.  It is significant for right inguinal lymph nodes asymmetric from the left and borderline enlarged.  His UA is unremarkable and his GU exam shows no evidence of  orchitis, epididymitis, syphilitic chancre.  He denies any symptoms suggestive of an STD.  His CBC is unremarkable so a lymphoproliferative disorder is unlikely however certainly warrants further work-up on an outpatient basis.  On reassessment the patient is resting comfortably in no apparent distress, he is tolerating p.o. fluids without difficulty and serial abdominal examinations remain benign.  No evidence of acute surgical abdominal pathology.  I explained that there was no evidence of hernia on his imaging and the mass is likely an enlarged lymph node.  We discussed all abnormal findings on his work-up today.  I stressed the importance of follow-up with a PCP for reevaluation and further work-up.  He was given resources for follow-up with a PCP on an outpatient basis.  Discussed strict ED return precautions. Pt verbalized understanding of and agreement with plan and is safe for discharge home at this time.  Final Clinical Impressions(s) / ED Diagnoses   Final diagnoses:  Elevated LFTs  Inguinal lymphadenopathy    ED Discharge Orders    None       Bennye Alm 09/27/18 1442    Jacalyn Lefevre, MD 09/27/18 1514

## 2018-09-25 NOTE — ED Triage Notes (Signed)
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

## 2018-09-26 LAB — GC/CHLAMYDIA PROBE AMP (~~LOC~~) NOT AT ARMC
Chlamydia: NEGATIVE
Neisseria Gonorrhea: NEGATIVE

## 2018-10-01 ENCOUNTER — Ambulatory Visit (INDEPENDENT_AMBULATORY_CARE_PROVIDER_SITE_OTHER): Payer: Self-pay | Admitting: Family Medicine

## 2018-10-01 ENCOUNTER — Other Ambulatory Visit: Payer: Self-pay

## 2018-10-01 ENCOUNTER — Encounter: Payer: Self-pay | Admitting: Family Medicine

## 2018-10-01 DIAGNOSIS — R590 Localized enlarged lymph nodes: Secondary | ICD-10-CM

## 2018-10-01 DIAGNOSIS — R945 Abnormal results of liver function studies: Secondary | ICD-10-CM

## 2018-10-01 DIAGNOSIS — R1031 Right lower quadrant pain: Secondary | ICD-10-CM

## 2018-10-01 DIAGNOSIS — R7989 Other specified abnormal findings of blood chemistry: Secondary | ICD-10-CM

## 2018-10-01 DIAGNOSIS — B182 Chronic viral hepatitis C: Secondary | ICD-10-CM

## 2018-10-01 MED ORDER — MELOXICAM 15 MG PO TABS: 15.0000 mg | ORAL_TABLET | Freq: Every day | ORAL | 0 refills | Status: DC

## 2018-10-01 NOTE — Progress Notes (Signed)
Virtual Visit via Telephone Note  I connected with Brent Bryant on 10/01/18 at 10:50 AM EDT by telephone and verified that I am speaking with the correct person using two identifiers.  Location: Patient: Located at home during today's encounter  Provider: Located at primary care office   I discussed the limitations, risks, security and privacy concerns of performing an evaluation and management service by telephone and the availability of in person appointments. I also discussed with the patient that there may be a patient responsible charge related to this service. The patient expressed understanding and agreed to proceed.   History of Present Illness: Brent Bryant is establishing care today. He is present for today's telemedicine encounter for evaluation of right groin pain, right inguinal lymphadenopathy, and elevated liver enzymes.  Right Groin Pain and Inguinal Adenopathy   Patient presented to [facility] ER on 09/25/18 with a complaint of acute onset right groin and inguinal mass. Patient initially thought he had an inguinal hernia however CT of the abdomen pelvis was performed.  CT was significant for right enlarged inguinal lymph nodes with borderline enlarged right external iliac lymphadenopathy.  Adenopathy was thought to be possibly reactive however lymphoproliferative disorder could not be ruled out.  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 No prior treatment for Hep C. Recent labs reflected grossly elevated liver enzymes during recent ER visit. He reports a recent incarceration and is currently on probation and is motivated to get healthcare concerns addressed. Patient reports that he has been drug free for approximately 2 years. He endorse fatigue, feeling feverish, and chills. Denies cough, recent known illness, shortness of breath or any other URI symptoms. He is currently applying warm compresses and taking ibuprofen to right groin. He denies dysuria,  recent GC/Chlyamida negative, foul urine order, or low back pain.   Assessment and Plan: 1. Chronic hepatitis C without hepatic coma (HCC) -Referral placed to infectious disease  2. Elevated LFTs -Likely secondary to chronic Hep-C  Referral pending to infectious disease  3. Inguinal pain, right 4. Right groin pain 5. Inguinal lymphadenopathy -Start with obtaining a scrotal ultrasound to rule out fluid accumulation as the source of groin pain.  -If unable to determine the source of patient's symptoms or if symptoms persist, will refer to urology for further evaluation and management. -Will consider a possible trial of Bactrim if pain and swelling persist. Recent GC/Chlyamidia testing was negative. Asymptomatic of urine symptoms.  Follow Up Instructions: Pending results of scrotal ultrasound.   I discussed the assessment and treatment plan with the patient. The patient was provided an opportunity to ask questions and all were answered. The patient agreed with the plan and demonstrated an understanding of the instructions.   The patient was advised to call back or seek an in-person evaluation if the symptoms worsen or if the condition fails to improve as anticipated.  I provided 30 minutes of non-face-to-face time during this encounter.   Joaquin Courts, [name]

## 2018-10-01 NOTE — Progress Notes (Deleted)
Called patient to initiate their telephone visit with provider Joaquin Courts, [name]-C. Verified date of birth. Patient states that he is still having some groin pain/swelling. KWalker, CMA.

## 2018-10-02 ENCOUNTER — Telehealth: Payer: Self-pay | Admitting: Family Medicine

## 2018-10-02 NOTE — Telephone Encounter (Signed)
Patient is scheduled for 10/05/2018 @ 10:45 AM at [facility]

## 2018-10-02 NOTE — Telephone Encounter (Signed)
Please schedule US scrotum study. Order placed in system. Patient has no preference of day. Locations: Patient prefers Brent Bryant

## 2018-10-02 NOTE — Telephone Encounter (Signed)
Patient called & notified of appointment information.

## 2018-10-03 ENCOUNTER — Telehealth: Payer: Self-pay | Admitting: Infectious Diseases

## 2018-10-03 NOTE — Telephone Encounter (Signed)
COVID-19 Pre-Screening Questions: ° °Do you currently have a fever (>100 °F), chills or unexplained body aches?no  ° °Are you currently experiencing new cough, shortness of breath, sore throat, runny nose?no  °•  °Have you recently travelled outside the state of [place] in the last 14 days? No  °•  °1. Have you been in contact with someone that is currently pending confirmation of Covid19 testing or has been confirmed to have the Covid19 virus?  No  ° °

## 2018-10-04 ENCOUNTER — Telehealth: Payer: Self-pay | Admitting: Pharmacy Technician

## 2018-10-04 ENCOUNTER — Encounter: Payer: Self-pay | Admitting: Infectious Diseases

## 2018-10-04 NOTE — Telephone Encounter (Signed)
[facility] Patient Advocate Encounter    Findings of the benefits investigation conducted this morning via test claims for the patient's upcoming appointment on 10/04/2018 are as follows:   Insurance: no insurance on file or found during   Will speak to the patient and see if there is any active insurance, if not will have him sign forms for patient assistance.  Prior Authorization: will begin insurance process once medication is prescribed. [facility] Patient Advocate will follow up once patient arrives for their appointment.  Beulah Gandy, CPhT [facility] Pharmacy Patient [facility] Phone: [phone] Fax: [phone] 10/04/2018 8:27 AM

## 2018-10-05 ENCOUNTER — Ambulatory Visit (HOSPITAL_COMMUNITY): Admission: RE | Admit: 2018-10-05 | Payer: Self-pay | Source: Ambulatory Visit

## 2019-09-12 ENCOUNTER — Emergency Department (HOSPITAL_COMMUNITY)
Admission: EM | Admit: 2019-09-12 | Discharge: 2019-09-12 | Disposition: A | Discharge status: Home / Self Care | Payer: Self-pay | Attending: Emergency Medicine | Admitting: Emergency Medicine

## 2019-09-12 ENCOUNTER — Other Ambulatory Visit: Payer: Self-pay

## 2019-09-12 ENCOUNTER — Encounter (HOSPITAL_COMMUNITY): Payer: Self-pay | Admitting: Emergency Medicine

## 2019-09-12 ENCOUNTER — Emergency Department (HOSPITAL_COMMUNITY): Payer: Self-pay

## 2019-09-12 DIAGNOSIS — J40 Bronchitis, not specified as acute or chronic: Secondary | ICD-10-CM | POA: Insufficient documentation

## 2019-09-12 DIAGNOSIS — Z20822 Contact with and (suspected) exposure to covid-19: Secondary | ICD-10-CM | POA: Insufficient documentation

## 2019-09-12 DIAGNOSIS — R6883 Chills (without fever): Secondary | ICD-10-CM | POA: Insufficient documentation

## 2019-09-12 DIAGNOSIS — F17210 Nicotine dependence, cigarettes, uncomplicated: Secondary | ICD-10-CM | POA: Insufficient documentation

## 2019-09-12 DIAGNOSIS — R197 Diarrhea, unspecified: Secondary | ICD-10-CM | POA: Insufficient documentation

## 2019-09-12 LAB — CBC WITH DIFFERENTIAL/PLATELET
Abs Immature Granulocytes: 0.02 10*3/uL (ref 0.00–0.07)
Basophils Absolute: 0.1 10*3/uL (ref 0.0–0.1)
Basophils Relative: 1 %
Eosinophils Absolute: 0.1 10*3/uL (ref 0.0–0.5)
Eosinophils Relative: 1 %
HCT: 44.2 % (ref 39.0–52.0)
Hemoglobin: 14.1 g/dL (ref 13.0–17.0)
Immature Granulocytes: 0 %
Lymphocytes Relative: 23 %
Lymphs Abs: 2 10*3/uL (ref 0.7–4.0)
MCH: 29.2 pg (ref 26.0–34.0)
MCHC: 31.9 g/dL (ref 30.0–36.0)
MCV: 91.5 fL (ref 80.0–100.0)
Monocytes Absolute: 0.6 10*3/uL (ref 0.1–1.0)
Monocytes Relative: 7 %
Neutro Abs: 5.9 10*3/uL (ref 1.7–7.7)
Neutrophils Relative %: 68 %
Platelets: 274 10*3/uL (ref 150–400)
RBC: 4.83 MIL/uL (ref 4.22–5.81)
RDW: 13.9 % (ref 11.5–15.5)
WBC: 8.7 10*3/uL (ref 4.0–10.5)
nRBC: 0 % (ref 0.0–0.2)

## 2019-09-12 LAB — COMPREHENSIVE METABOLIC PANEL
ALT: 286 U/L — ABNORMAL HIGH (ref 0–44)
AST: 142 U/L — ABNORMAL HIGH (ref 15–41)
Albumin: 4.7 g/dL (ref 3.5–5.0)
Alkaline Phosphatase: 59 U/L (ref 38–126)
Anion gap: 9 (ref 5–15)
BUN: 7 mg/dL (ref 6–20)
CO2: 26 mmol/L (ref 22–32)
Calcium: 9.1 mg/dL (ref 8.9–10.3)
Chloride: 103 mmol/L (ref 98–111)
Creatinine, Ser: 0.74 mg/dL (ref 0.61–1.24)
GFR calc Af Amer: >60 mL/min (ref >60)
GFR calc non Af Amer: >60 mL/min (ref >60)
Glucose, Bld: 94 mg/dL (ref 70–99)
Potassium: 3.9 mmol/L (ref 3.5–5.1)
Sodium: 138 mmol/L (ref 135–145)
Total Bilirubin: 0.5 mg/dL (ref 0.3–1.2)
Total Protein: 8.3 g/dL — ABNORMAL HIGH (ref 6.5–8.1)

## 2019-09-12 MED ORDER — AZITHROMYCIN 250 MG PO TABS
250.0000 mg | ORAL_TABLET | Freq: Every day | ORAL | 0 refills | Status: DC
Start: 2019-09-12 — End: 2020-09-04

## 2019-09-12 MED ORDER — PREDNISONE 20 MG PO TABS
40.0000 mg | ORAL_TABLET | Freq: Every day | ORAL | 0 refills | Status: DC
Start: 2019-09-12 — End: 2020-09-04

## 2019-09-12 MED ORDER — ALBUTEROL SULFATE HFA 108 (90 BASE) MCG/ACT IN AERS
2.0000 | INHALATION_SPRAY | Freq: Once | RESPIRATORY_TRACT | Status: AC
  Administered 2019-09-12: 2 via RESPIRATORY_TRACT
  Filled 2019-09-12: qty 6.7

## 2019-09-12 NOTE — ED Triage Notes (Signed)
Pt reports coughing up mucous and been spitting into a bottle and reports 40-50 ounces of mucous over the past week. Having chills and hot sweats, nausea, body aches, and diarrhea.

## 2019-09-12 NOTE — ED Provider Notes (Signed)
[facility] DEPT Provider Note   CSN: [id number] Arrival date & time: 09/12/19  1446     History Chief Complaint  Patient presents with  . Cough  . Chills  . Diarrhea    Brent Bryant is a 29 y.o. male with history of tobacco abuse, Hep C, heroin abuse currently in remission who presents with cough.  He states for the past 2 weeks he has had the feeling of being hot, having chills, sweats, myalgias, and a productive cough.  He states that he is coughed up "40 to 50 ounces" of lime green mucus.  He is also been having a runny nose and nasal congestion.  He had some chest tightness and shortness of breath earlier but once he coughed up the mucus he felt better.  Today he started to have diarrhea and he is looking up his symptoms and concerned he may have Covid so decided to come to the emergency department.  He has not had a vaccination because he is scared to get one.  He denies abdominal pain, nausea or vomiting.  He has been taking NyQuil and other over-the-counter medicines for his symptoms without relief.  He is a smoker.  HPI     Past Medical History:  Diagnosis Date  . Allergy     There are no problems to display for this patient.   History reviewed. No pertinent surgical history.     Family History  Problem Relation Age of Onset  . Diabetes Mother   . Healthy Father   . Stroke Neg Hx   . Cancer Neg Hx     Social History   Tobacco Use  . Smoking status: Current Every Day Smoker    Types: Cigarettes  . Smokeless tobacco: Never Used  Substance Use Topics  . Alcohol use: No  . Drug use: Yes    Types: Marijuana    Home Medications Prior to Admission medications   Medication Sig Start Date End Date Taking? Authorizing Provider  meloxicam (MOBIC) 15 MG tablet Take 1 tablet (15 mg total) by mouth daily. 10/01/18   Scot Jun, [name]    Allergies    Tylenol [acetaminophen] and Vicodin [hydrocodone-acetaminophen]  Review of  Systems   Review of Systems  Constitutional: Positive for chills and fever.  HENT: Positive for congestion and postnasal drip. Negative for sore throat.   Respiratory: Positive for cough and chest tightness. Negative for shortness of breath and wheezing.   Cardiovascular: Negative for chest pain.  Gastrointestinal: Positive for diarrhea. Negative for abdominal pain, nausea and vomiting.  All other systems reviewed and are negative.   Physical Exam Updated Vital Signs BP 120/83 (BP Location: Left Arm)   Pulse (!) 114   Temp 98.3 F (36.8 C) (Oral)   Resp 18   SpO2 100%   Physical Exam Vitals and nursing note reviewed.  Constitutional:      General: He is not in acute distress.    Appearance: Normal appearance. He is well-developed. He is not ill-appearing.     Comments: Calm, cooperative. NAD  HENT:     Head: Normocephalic and atraumatic.     Right Ear: Tympanic membrane normal.     Left Ear: Tympanic membrane normal.     Nose: Congestion present.     Mouth/Throat:     Mouth: Mucous membranes are moist.  Eyes:     General: No scleral icterus.       Right eye: No discharge.  Left eye: No discharge.     Conjunctiva/sclera: Conjunctivae normal.     Pupils: Pupils are equal, round, and reactive to light.  Cardiovascular:     Rate and Rhythm: Normal rate and regular rhythm.  Pulmonary:     Effort: Pulmonary effort is normal. No respiratory distress.     Breath sounds: Normal breath sounds.  Abdominal:     General: There is no distension.     Palpations: Abdomen is soft.     Tenderness: There is no abdominal tenderness.  Musculoskeletal:     Cervical back: Normal range of motion.  Skin:    General: Skin is warm and dry.  Neurological:     Mental Status: He is alert and oriented to person, place, and time.  Psychiatric:        Behavior: Behavior normal.     ED Results / Procedures / Treatments   Labs (all labs ordered are listed, but only abnormal results are  displayed) Labs Reviewed  COMPREHENSIVE METABOLIC PANEL - Abnormal; Notable for the following components:      Result Value   Total Protein 8.3 (*)    AST 142 (*)    ALT 286 (*)    All other components within normal limits  SARS CORONAVIRUS 2 (TAT 6-24 HRS)  CBC WITH DIFFERENTIAL/PLATELET    EKG None  Radiology No results found.  Procedures Procedures (including critical care time)  Medications Ordered in ED Medications - No data to display  ED Course  I have reviewed the triage vital signs and the nursing notes.  Pertinent labs & imaging results that were available during my care of the patient were reviewed by me and considered in my medical decision making (see chart for details).  29 year old male presents with productive cough, shortness of breath, wheezing, chills and diarrhea for several weeks.  He is mildly tachycardic in triage and this is resolved on recheck.  Exam is overall reassuring.  Heart is regular rate and rhythm.  Lungs are clear to auscultation.  Does have frequent coughing.  Abdomen is soft and nontender.  Will obtain basic labs, chest x-ray, Covid test  Labs show elevated LFTs which is a chronic problem.  He has been referred to infectious disease in the past for hep C and states that he has not had time to follow-up yet.  His chest x-ray is negative.  Covid test is pending.  We will treat for bronchitis with albuterol, steroids, Z-Pak.  Referral to infectious disease given.  Brent Bryant was evaluated in Emergency Department on 09/12/2019 for the symptoms described in the history of present illness. He was evaluated in the context of the global COVID-19 pandemic, which necessitated consideration that the patient might be at risk for infection with the SARS-CoV-2 virus that causes COVID-19. Institutional protocols and algorithms that pertain to the evaluation of patients at risk for COVID-19 are in a state of rapid change based on information released by  regulatory bodies including the CDC and federal and state organizations. These policies and algorithms were followed during the patient's care in the ED.   MDM Rules/Calculators/A&P                       Final Clinical Impression(s) / ED Diagnoses Final diagnoses:  Bronchitis    Rx / DC Orders ED Discharge Orders    None       Bethel Born, PA-C 09/12/19 1839    Maia Plan, MD 09/13/19  0748  

## 2019-09-12 NOTE — Discharge Instructions (Signed)
Use albuterol for shortness of breath or wheezing Take Azithromycin as directed Take Prednisone for the next 5 days Please follow up with infectious disease for treatment of Hep C. Avoid any Tylenol or alcohol

## 2019-09-13 LAB — SARS CORONAVIRUS 2 (TAT 6-24 HRS): SARS Coronavirus 2: NEGATIVE

## 2020-01-31 ENCOUNTER — Encounter (HOSPITAL_COMMUNITY): Payer: Self-pay

## 2020-01-31 ENCOUNTER — Other Ambulatory Visit: Payer: Self-pay

## 2020-01-31 ENCOUNTER — Emergency Department (HOSPITAL_COMMUNITY)
Admission: EM | Admit: 2020-01-31 | Discharge: 2020-01-31 | Disposition: A | Discharge status: Home / Self Care | Payer: Self-pay | Attending: Emergency Medicine | Admitting: Emergency Medicine

## 2020-01-31 DIAGNOSIS — K047 Periapical abscess without sinus: Secondary | ICD-10-CM

## 2020-01-31 DIAGNOSIS — R220 Localized swelling, mass and lump, head: Secondary | ICD-10-CM

## 2020-01-31 DIAGNOSIS — F17210 Nicotine dependence, cigarettes, uncomplicated: Secondary | ICD-10-CM | POA: Insufficient documentation

## 2020-01-31 HISTORY — DX: Unspecified viral hepatitis C without hepatic coma: B19.20

## 2020-01-31 MED ORDER — CLINDAMYCIN HCL 150 MG PO CAPS: 450.0000 mg | ORAL_CAPSULE | Freq: Three times a day (TID) | ORAL | 0 refills | Status: AC

## 2020-01-31 MED ORDER — KETOROLAC TROMETHAMINE 60 MG/2ML IM SOLN
60.0000 mg | Freq: Once | INTRAMUSCULAR | Status: AC
  Administered 2020-01-31: 60 mg via INTRAMUSCULAR
  Filled 2020-01-31: qty 2

## 2020-01-31 MED ORDER — CLINDAMYCIN HCL 300 MG PO CAPS
450.0000 mg | ORAL_CAPSULE | Freq: Once | ORAL | Status: AC
  Administered 2020-01-31: 450 mg via ORAL
  Filled 2020-01-31: qty 1

## 2020-01-31 MED ORDER — BUPIVACAINE-EPINEPHRINE (PF) 0.5% -1:200000 IJ SOLN
1.8000 mL | Freq: Once | INTRAMUSCULAR | Status: AC
  Administered 2020-01-31: 1.8 mL
  Filled 2020-01-31: qty 1.8

## 2020-01-31 NOTE — ED Triage Notes (Signed)
Patient said he has a broken tooth on the left lower side. He felt facial pain last night at 1am. When he woke up at 630am he woke up with a swollen face. He has a history of heroin use and had a similar abscess on his hand 5 years ago. Has not used heroin in 3 years.

## 2020-01-31 NOTE — ED Provider Notes (Signed)
[facility]-EMERGENCY DEPT Provider Note   CSN: [id number] Arrival date & time: 01/31/20  0759     History Chief Complaint  Patient presents with  . Dental Pain  . Facial Swelling  . Cough    Brent Bryant is a 29 y.o. male presents to the ER for evaluation of left facial swelling.  Patient states he began having pain in the left lower jaw last night.  He woke up and suddenly noticed swelling on this side.  Pain is worse with chewing, palpation.  Constant, throbbing, moderate to severe.  No interventions for this.  States he has a known broken tooth in that area.  Denies any fevers or chills.  Has had dental abscesses in the past.  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  Carries diagnosis of hepatitis C and patient states he was exposed to it and is awaiting treatment for it but pending due to Medicaid issues.  HPI     Past Medical History:  Diagnosis Date  . Allergy   . Hepatitis C     There are no problems to display for this patient.   History reviewed. No pertinent surgical history.     Family History  Problem Relation Age of Onset  . Diabetes Mother   . Healthy Father   . Stroke Neg Hx   . Cancer Neg Hx     Social History   Tobacco Use  . Smoking status: Current Every Day Smoker    Packs/day: 0.50    Years: 12.00    Pack years: 6.00    Types: Cigarettes  . Smokeless tobacco: Never Used  Substance Use Topics  . Alcohol use: No  . Drug use: Yes    Types: Marijuana    Home Medications Prior to Admission medications   Medication Sig Start Date End Date Taking? Authorizing Provider  azithromycin (ZITHROMAX) 250 MG tablet Take 1 tablet (250 mg total) by mouth daily. Take first 2 tablets together, then 1 every day until finished. 09/12/19   Bethel Born, PA-C  clindamycin (CLEOCIN) 150 MG capsule Take 3 capsules (450 mg total) by mouth 3 (three) times daily for 10 days. 01/31/20 02/10/20  Liberty Handy, PA-C  meloxicam (MOBIC) 15 MG tablet Take 1 tablet (15 mg total) by mouth daily. 10/01/18   Bing Neighbors, [name]  predniSONE (DELTASONE) 20 MG tablet Take 2 tablets (40 mg total) by mouth daily. 09/12/19   Bethel Born, PA-C    Allergies    Tylenol [acetaminophen] and Vicodin [hydrocodone-acetaminophen]  Review of Systems   Review of Systems  HENT: Positive for facial swelling.   All other systems reviewed and are negative.   Physical Exam Updated Vital Signs BP 132/75 (BP Location: Right Arm)   Pulse 94   Temp 98 F (36.7 C) (Oral)   Resp 19   Ht 5\' 9"  (1.753 m)   Wt 54.4 kg   SpO2 100%   BMI 17.72 kg/m   Physical Exam Constitutional:      Appearance: He is well-developed.  HENT:     Head: Normocephalic.      Comments: Moderate area of swelling, tenderness to left lateral madible    Nose: Nose normal.     Mouth/Throat:      Comments: Tooth is significantly decayed, cracked. Exquisite tenderness on buccal aspect of gingiva. There is moderate swelling, tenderness, fluctuance at mucobuccal fold.  Oropharynx, tonsils, sublingual space normal. Tolerating  secretions. Normal phonation  Eyes:     General: Lids are normal.  Neck:     Comments: Mildly enlarged, tender left submandibular lymphadenopathy. No significant asymmetric anterior/lateral neck edema. Trachea midline.  Cardiovascular:     Rate and Rhythm: Normal rate.  Pulmonary:     Effort: Pulmonary effort is normal. No respiratory distress.  Musculoskeletal:        General: Normal range of motion.     Cervical back: Normal range of motion.  Neurological:     Mental Status: He is alert.  Psychiatric:        Behavior: Behavior normal.     ED Results / Procedures / Treatments   Labs (all labs ordered are listed, but only abnormal results are displayed) Labs Reviewed - No data to display  EKG None  Radiology No results found.  Procedures Dental Block  Date/Time: 01/31/2020 4:02  PM Performed by: Liberty Handy, PA-C Authorized by: Liberty Handy, PA-C   Consent:    Consent obtained:  Verbal   Consent given by:  Patient   Risks discussed:  Infection, nerve damage, swelling, unsuccessful block, pain and hematoma   Alternatives discussed:  Alternative treatment Indications:    Indications: dental abscess and dental pain   Location:    Block type:  Inferior alveolar   Laterality:  Left Procedure details (see MAR for exact dosages):    Topical anesthetic:  Lidocaine gel   Syringe type:  Aspirating dental syringe   Needle gauge:  27 G   Anesthetic injected:  Bupivacaine 0.5% WITH epi   Injection procedure:  Anatomic landmarks identified, introduced needle, negative aspiration for blood, anatomic landmarks palpated and incremental injection Post-procedure details:    Outcome:  Anesthesia achieved   Patient tolerance of procedure:  Tolerated well, no immediate complications .Marland KitchenIncision and Drainage  Date/Time: 01/31/2020 4:04 PM Performed by: Liberty Handy, PA-C Authorized by: Liberty Handy, PA-C   Consent:    Consent obtained:  Verbal   Consent given by:  Patient   Risks discussed:  Bleeding, incomplete drainage, pain and damage to other organs   Alternatives discussed:  No treatment Universal protocol:    Procedure explained and questions answered to patient or proxy's satisfaction: yes     Relevant documents present and verified: yes     Test results available and properly labeled: yes     Imaging studies available: yes     Required blood products, implants, devices, and special equipment available: yes     Site/side marked: yes     Immediately prior to procedure a time out was called: yes     Patient identity confirmed:  Verbally with patient Location:    Type:  Abscess   Location: dental. Pre-procedure details:    Skin preparation:  Betadine Procedure type:    Complexity:  Complex Procedure details:    Incision types:  Single  straight   Scalpel blade:  11   Wound management:  Probed and deloculated, irrigated with saline and extensive cleaning   Drainage:  Purulent   Drainage amount:  Moderate Post-procedure details:    Patient tolerance of procedure:  Tolerated well, no immediate complications   (including critical care time)  Medications Ordered in ED Medications  ketorolac (TORADOL) injection 60 mg (60 mg Intramuscular Given 01/31/20 1253)  clindamycin (CLEOCIN) capsule 450 mg (450 mg Oral Given 01/31/20 1252)  bupivacaine-epinephrine (MARCAINE W/ EPI) 0.5% -1:200000 injection 1.8 mL (1.8 mLs Infiltration Given 01/31/20 1323)  ED Course  I have reviewed the triage vital signs and the nursing notes.  Pertinent labs & imaging results that were available during my care of the patient were reviewed by me and considered in my medical decision making (see chart for details).    MDM Rules/Calculators/A&P                          Exam consistent with otogenic infection likely from tooth as indicated above.  No fluctuance noted on gingiva however, localized fluctuance tenderness at left lower mucobuccal fold.  Dental block with complete anesthesia obtained, I&D with mostly bloody drainage.  Patient tolerated procedure well without immediate complications.  Will dc with clindamycin, high dose NSAIDS, warm compressions. Dentist contact given, encouraged to call today for recheck in 48-72 hr.  Return precautions given. Patient in agreement with POC.   Final Clinical Impression(s) / ED Diagnoses Final diagnoses:  Facial swelling  Dental infection    Rx / DC Orders ED Discharge Orders         Ordered    clindamycin (CLEOCIN) 150 MG capsule  3 times daily        01/31/20 1441           Jerrell Mylar 01/31/20 2047    Tilden Fossa, MD 02/01/20 [id number]

## 2020-01-31 NOTE — Discharge Instructions (Signed)
You were seen in the ER for dental pain and facial swelling.  We attempted incision and drainage today. We obtained a lot of blood but no pus.   Take clindamycin as prescribed. This should help with an early infection and the pain. You should see improvement in symptoms in 24-48 hours of antibiotics.   Hot water rinses and massage as much as possible  Dental pain is difficult to control because most of the pain is from exposed nerve endings.  Take 1000 mg of acetaminophen (Tylenol) every 6 hours.  For more pain control take ibuprofen 600 mg every 6 hours.  You can combine acetaminophen and ibuprofen as above every 6 hours for maximum pain and inflammation control.  Use pea sized amount of Orajel 15 to 20 minutes around area of pain and exposed nerves prior to eating or drinking to desensitize nerve endings.  You can apply a pea sized amount of desensitizing toothpastes (Sensodyne, Pronamel, [employer] sensitive) throughout the day to help with nerve pain and sensitivity.  Warm water and salt soaks can help with inflammation and drainage formation.  Unfortunately, dental pain will continue until you have a full dental evaluation and treatment.  Please call Dr Lucky Cowboy today for an appointment in 48 hours. Tell them you were seen in the ER and recommended evaluation as soon as possible.  Return to the ER for fevers, worsening eye lid or neck or facial, fever.

## 2020-04-10 ENCOUNTER — Other Ambulatory Visit: Payer: Self-pay

## 2020-04-10 ENCOUNTER — Emergency Department (HOSPITAL_COMMUNITY)
Admission: EM | Admit: 2020-04-10 | Discharge: 2020-04-10 | Disposition: A | Discharge status: Home / Self Care | Payer: Self-pay | Attending: Emergency Medicine | Admitting: Emergency Medicine

## 2020-04-10 ENCOUNTER — Encounter (HOSPITAL_COMMUNITY): Payer: Self-pay

## 2020-04-10 ENCOUNTER — Emergency Department (HOSPITAL_COMMUNITY): Payer: Self-pay

## 2020-04-10 DIAGNOSIS — R0789 Other chest pain: Secondary | ICD-10-CM | POA: Insufficient documentation

## 2020-04-10 DIAGNOSIS — F17210 Nicotine dependence, cigarettes, uncomplicated: Secondary | ICD-10-CM | POA: Insufficient documentation

## 2020-04-10 DIAGNOSIS — R0602 Shortness of breath: Secondary | ICD-10-CM | POA: Insufficient documentation

## 2020-04-10 DIAGNOSIS — R072 Precordial pain: Secondary | ICD-10-CM | POA: Insufficient documentation

## 2020-04-10 LAB — BASIC METABOLIC PANEL
Anion gap: 11 (ref 5–15)
BUN: 22 mg/dL — ABNORMAL HIGH (ref 6–20)
CO2: 21 mmol/L — ABNORMAL LOW (ref 22–32)
Calcium: 9.3 mg/dL (ref 8.9–10.3)
Chloride: 103 mmol/L (ref 98–111)
Creatinine, Ser: 0.71 mg/dL (ref 0.61–1.24)
GFR, Estimated: >60 mL/min (ref >60)
Glucose, Bld: 105 mg/dL — ABNORMAL HIGH (ref 70–99)
Potassium: 3.8 mmol/L (ref 3.5–5.1)
Sodium: 135 mmol/L (ref 135–145)

## 2020-04-10 LAB — CBC
HCT: 41.6 % (ref 39.0–52.0)
Hemoglobin: 13.9 g/dL (ref 13.0–17.0)
MCH: 29.6 pg (ref 26.0–34.0)
MCHC: 33.4 g/dL (ref 30.0–36.0)
MCV: 88.5 fL (ref 80.0–100.0)
Platelets: 257 10*3/uL (ref 150–400)
RBC: 4.7 MIL/uL (ref 4.22–5.81)
RDW: 12.8 % (ref 11.5–15.5)
WBC: 10.4 10*3/uL (ref 4.0–10.5)
nRBC: 0 % (ref 0.0–0.2)

## 2020-04-10 LAB — TROPONIN I (HIGH SENSITIVITY): Troponin I (High Sensitivity): 5 ng/L (ref <18)

## 2020-04-10 MED ORDER — ALBUTEROL SULFATE HFA 108 (90 BASE) MCG/ACT IN AERS
2.0000 | INHALATION_SPRAY | RESPIRATORY_TRACT | 0 refills | Status: DC | PRN
Start: 2020-04-10 — End: 2023-06-04

## 2020-04-10 MED ORDER — ALBUTEROL SULFATE HFA 108 (90 BASE) MCG/ACT IN AERS
2.0000 | INHALATION_SPRAY | Freq: Once | RESPIRATORY_TRACT | Status: AC
  Administered 2020-04-10: 2 via RESPIRATORY_TRACT
  Filled 2020-04-10: qty 6.7

## 2020-04-10 NOTE — Discharge Instructions (Addendum)
I have provided an albuterol inhaler to help with your breathing, you may use this as needed.  You will need to obtain a referral for a pulmonologist from your PCP, and discontinue tobacco use.   Follow up with primary care as needed.

## 2020-04-10 NOTE — ED Provider Notes (Signed)
[facility]-EMERGENCY DEPT Provider Note   CSN: [id number] Arrival date & time: 04/10/20  1656     History Chief Complaint  Patient presents with  . Chest Pain    Brent Bryant is a 29 y.o. male.  29 y.o male with a PMH of hepatitis C presents to the ED with a chief complaint of shortness of breath x 7 months. Patient reports cough with a thick clear sputum for the past 7 months, he does report smoking cigarettes since he was 29 years old.  States he was prescribed Atarax, naproxen by his new PCP without any improvement in his symptoms.  When asking him on the reason why he is here, he does report chest pain which feels like pressure to the front of his chest.  He also endorses smoking marijuana prior to arrival into the ED.  No fever, hemoptysis, prior history of cardiac disease.No nausea, vomiting, or diarrhea.      The history is provided by the patient.  Chest Pain Pain location:  Substernal area Pain quality: dull   Pain radiates to:  Does not radiate Pain severity:  Mild Onset quality:  Gradual Duration:  7 months Timing:  Constant Progression:  Unchanged Chronicity:  Recurrent Context: not drug use   Relieved by:  Nothing Worsened by:  Smoking Ineffective treatments:  None tried Associated symptoms: shortness of breath   Associated symptoms: no abdominal pain, no back pain, no fever, no headache, no nausea and no vomiting   Risk factors: male sex and smoking   Risk factors: no coronary artery disease, no diabetes mellitus, no high cholesterol, no hypertension, no immobilization, not pregnant and no prior DVT/PE        Past Medical History:  Diagnosis Date  . Allergy   . Hepatitis C     There are no problems to display for this patient.   History reviewed. No pertinent surgical history.     Family History  Problem Relation Age of Onset  . Diabetes Mother   . Healthy Father   . Stroke Neg Hx   . Cancer Neg Hx     Social History    Tobacco Use  . Smoking status: Current Every Day Smoker    Packs/day: 0.50    Years: 12.00    Pack years: 6.00    Types: Cigarettes  . Smokeless tobacco: Never Used  Vaping Use  . Vaping Use: Never used  Substance Use Topics  . Alcohol use: No  . Drug use: Yes    Types: Marijuana    Home Medications Prior to Admission medications   Medication Sig Start Date End Date Taking? Authorizing Provider  Multiple Vitamin (MULTIVITAMIN WITH MINERALS) TABS tablet Take 1 tablet by mouth daily.   Yes [provider]  albuterol (VENTOLIN HFA) 108 (90 Base) MCG/ACT inhaler Inhale 2 puffs into the lungs every 4 (four) hours as needed for wheezing or shortness of breath. 04/10/20 05/10/20  Claude Manges, PA-C  azithromycin (ZITHROMAX) 250 MG tablet Take 1 tablet (250 mg total) by mouth daily. Take first 2 tablets together, then 1 every day until finished. Patient not taking: No sig reported 09/12/19   Bethel Born, PA-C  meloxicam (MOBIC) 15 MG tablet Take 1 tablet (15 mg total) by mouth daily. Patient not taking: No sig reported 10/01/18   Bing Neighbors, [name]  predniSONE (DELTASONE) 20 MG tablet Take 2 tablets (40 mg total) by mouth daily. Patient not taking: No sig reported 09/12/19  Bethel Born, PA-C    Allergies    Tylenol [acetaminophen] and Vicodin [hydrocodone-acetaminophen]  Review of Systems   Review of Systems  Constitutional: Negative for chills and fever.  HENT: Negative for sore throat.   Respiratory: Positive for shortness of breath. Negative for wheezing.   Cardiovascular: Positive for chest pain.  Gastrointestinal: Negative for abdominal pain, diarrhea, nausea and vomiting.  Genitourinary: Negative for flank pain.  Musculoskeletal: Negative for back pain.  Neurological: Negative for headaches.  All other systems reviewed and are negative.   Physical Exam Updated Vital Signs BP 108/86   Pulse 71   Temp 97.9 F (36.6 C)   Resp (!) 21   Ht 5\' 9"   (1.753 m)   Wt 59 kg   SpO2 96%   BMI 19.20 kg/m   Physical Exam Vitals and nursing note reviewed.  Constitutional:      Appearance: He is well-developed.  Pulmonary:     Effort: Pulmonary effort is normal.     Breath sounds: Examination of the right-lower field reveals decreased breath sounds. Examination of the left-lower field reveals decreased breath sounds. Decreased breath sounds present. No wheezing or rhonchi.  Chest:     Chest wall: Tenderness present.       Comments: Pain reproducible with palpation along the chest wall.  Abdominal:     General: Bowel sounds are normal.     Palpations: Abdomen is soft.  Skin:    General: Skin is warm and dry.  Neurological:     Mental Status: He is alert.     ED Results / Procedures / Treatments   Labs (all labs ordered are listed, but only abnormal results are displayed) Labs Reviewed  BASIC METABOLIC PANEL - Abnormal; Notable for the following components:      Result Value   CO2 21 (*)    Glucose, Bld 105 (*)    BUN 22 (*)    All other components within normal limits  CBC  TROPONIN I (HIGH SENSITIVITY)  TROPONIN I (HIGH SENSITIVITY)    EKG EKG Interpretation  Date/Time:  Friday April 10 2020 17:07:40 EST Ventricular Rate:  116 PR Interval:    QRS Duration: 94 QT Interval:  294 QTC Calculation: 409 R Axis:   96 Text Interpretation: Sinus tachycardia with irregular rate Biatrial enlargement Borderline right axis deviation ST elev, probable normal early repol pattern Baseline wander in lead(s) II 12 Lead; [place]-L[place]ar No previous ECGs available Confirmed by [id number] [phone]) on 04/10/2020 5:35:36 PM   Radiology DG Chest 2 View  Result Date: 04/10/2020 CLINICAL DATA:  Chest pain short of breath EXAM: CHEST - 2 VIEW COMPARISON:  09/12/2019 FINDINGS: The heart size and mediastinal contours are within normal limits. Both lungs are clear. The visualized skeletal structures are unremarkable. IMPRESSION: No active  cardiopulmonary disease. Electronically Signed   By: 09/14/2019 M.D.   On: 04/10/2020 17:49    Procedures Procedures (including critical care time)  Medications Ordered in ED Medications  albuterol (VENTOLIN HFA) 108 (90 Base) MCG/ACT inhaler 2 puff (has no administration in time range)    ED Course  I have reviewed the triage vital signs and the nursing notes.  Pertinent labs & imaging results that were available during my care of the patient were reviewed by me and considered in my medical decision making (see chart for details).    MDM Rules/Calculators/A&P   Patient with a past medical history of hepatitis C presents to the ED with a  chief complaint of shortness of breath for the past 7 months.  Patient does endorse smoking tobacco for the past 15 years.  He states a clear sputum coming out every time he coughs.  He recently stylish care with a PCP who recommended he be started on Atarax for anxiety.  Patient does endorse smoking marijuana prior to arrival into the ED, was first found to be tachycardic on arrival via EKG.  Exam is reassuring, lungs are cleared to auscultation without wheezing or rhonchi. Chest is mildly tender on palpation but without any bruising or rashes present. CBC without any leukocytosis, hemoglobin is within normal limits. BMP without any electrolyte abnormality, glucose is midly elevated but no gap present. Creatine level is within normal limits. First troponin is negative, no prior hx of cardiac disease. No prior history of DVT, pulmonary embolism, lower suspicion for blood clot at this time.  Patient does endorse a 16-year smoking history, has developed a chronic cough at this time.  He does have a new physician, he is requesting an albuterol inhaler as he reports this helps with his breathing.  I will provide this for patient.  X-ray without any pneumonia, pleural effusion or acute pathology.  Patient is hemodynamically stable, heart rate has improved after  being in the emergency department for about 3 hours, is tolerating p.o. without any complaints.  Return precautions were discussed at length.   Portions of this note were generated with Scientist, clinical (histocompatibility and immunogenetics). Dictation errors may occur despite best attempts at proofreading.  Final Clinical Impression(s) / ED Diagnoses Final diagnoses:  Chest wall pain    Rx / DC Orders ED Discharge Orders         Ordered    albuterol (VENTOLIN HFA) 108 (90 Base) MCG/ACT inhaler  Every 4 hours PRN        04/10/20 2033           Claude Manges, PA-C 04/10/20 2036    Charlynne Pander, MD 04/10/20 2300

## 2020-04-10 NOTE — ED Triage Notes (Signed)
Patient c/o a productive cough with white thick sputum and intermittent SOB x 7 months.  Patient states that when he can not breathe he has chest pain.

## 2020-04-10 NOTE — ED Notes (Signed)
Patient provided sprite for po challenge.

## 2020-09-04 ENCOUNTER — Encounter (HOSPITAL_COMMUNITY): Payer: Self-pay

## 2020-09-04 ENCOUNTER — Emergency Department (HOSPITAL_COMMUNITY)
Admission: EM | Admit: 2020-09-04 | Discharge: 2020-09-04 | Disposition: A | Discharge status: Home / Self Care | Payer: Self-pay | Attending: Emergency Medicine | Admitting: Emergency Medicine

## 2020-09-04 ENCOUNTER — Other Ambulatory Visit: Payer: Self-pay

## 2020-09-04 ENCOUNTER — Emergency Department (HOSPITAL_COMMUNITY): Payer: Self-pay

## 2020-09-04 DIAGNOSIS — F17210 Nicotine dependence, cigarettes, uncomplicated: Secondary | ICD-10-CM | POA: Insufficient documentation

## 2020-09-04 DIAGNOSIS — N451 Epididymitis: Secondary | ICD-10-CM | POA: Insufficient documentation

## 2020-09-04 LAB — BASIC METABOLIC PANEL
Anion gap: 7 (ref 5–15)
BUN: 17 mg/dL (ref 6–20)
CO2: 25 mmol/L (ref 22–32)
Calcium: 9.8 mg/dL (ref 8.9–10.3)
Chloride: 108 mmol/L (ref 98–111)
Creatinine, Ser: 1.13 mg/dL (ref 0.61–1.24)
GFR, Estimated: >60 mL/min (ref >60)
Glucose, Bld: 81 mg/dL (ref 70–99)
Potassium: 4.3 mmol/L (ref 3.5–5.1)
Sodium: 140 mmol/L (ref 135–145)

## 2020-09-04 LAB — CBC
HCT: 43 % (ref 39.0–52.0)
Hemoglobin: 13.7 g/dL (ref 13.0–17.0)
MCH: 28.2 pg (ref 26.0–34.0)
MCHC: 31.9 g/dL (ref 30.0–36.0)
MCV: 88.5 fL (ref 80.0–100.0)
Platelets: 263 10*3/uL (ref 150–400)
RBC: 4.86 MIL/uL (ref 4.22–5.81)
RDW: 13.8 % (ref 11.5–15.5)
WBC: 5.4 10*3/uL (ref 4.0–10.5)
nRBC: 0 % (ref 0.0–0.2)

## 2020-09-04 LAB — URINALYSIS, ROUTINE W REFLEX MICROSCOPIC
Bacteria, UA: NONE SEEN
Bilirubin Urine: NEGATIVE
Glucose, UA: NEGATIVE mg/dL
Hgb urine dipstick: NEGATIVE
Ketones, ur: 5 mg/dL — AB
Leukocytes,Ua: NEGATIVE
Nitrite: NEGATIVE
Protein, ur: 30 mg/dL — AB
Specific Gravity, Urine: 1.032 — ABNORMAL HIGH (ref 1.005–1.030)
pH: 5 (ref 5.0–8.0)

## 2020-09-04 MED ORDER — NAPROXEN 500 MG PO TABS: 500.0000 mg | ORAL_TABLET | Freq: Two times a day (BID) | ORAL | 0 refills | Status: DC

## 2020-09-04 MED ORDER — KETOROLAC TROMETHAMINE 30 MG/ML IJ SOLN
30.0000 mg | Freq: Once | INTRAMUSCULAR | Status: AC
  Administered 2020-09-04: 30 mg via INTRAVENOUS
  Filled 2020-09-04: qty 1

## 2020-09-04 MED ORDER — CIPROFLOXACIN HCL 500 MG PO TABS: 500.0000 mg | ORAL_TABLET | Freq: Two times a day (BID) | ORAL | 0 refills | Status: DC

## 2020-09-04 NOTE — Discharge Instructions (Signed)
Take the medications as prescribed.  Follow-up with the urologist if the symptoms do not resolve

## 2020-09-04 NOTE — ED Triage Notes (Signed)
Patient reports that he woke this AM with left testicle pain and swelling.

## 2020-09-04 NOTE — ED Provider Notes (Signed)
[facility]-EMERGENCY DEPT Provider Note   CSN: [id number] Arrival date & time: 09/04/20  0849     History Chief complaint: Testicle pain  Brent Bryant is a 30 y.o. male.  HPI   Patient presents to the ED with complaints of acute left testicle pain.  Patient states he woke up this morning and had tenderness of his left testicle.  He also felt like it was swollen.  He massaged it and the pain decreased somewhat but it is still tender.  He has not had any fevers or chills.  He has not had any trauma to the testicle recently.  Denies any vomiting or diarrhea.  No dysuria  Past Medical History:  Diagnosis Date  . Allergy   . Hepatitis C     There are no problems to display for this patient.   History reviewed. No pertinent surgical history.     Family History  Problem Relation Age of Onset  . Diabetes Mother   . Healthy Father   . Stroke Neg Hx   . Cancer Neg Hx     Social History   Tobacco Use  . Smoking status: Current Every Day Smoker    Packs/day: 0.50    Years: 12.00    Pack years: 6.00    Types: Cigarettes  . Smokeless tobacco: Never Used  Vaping Use  . Vaping Use: Never used  Substance Use Topics  . Alcohol use: No  . Drug use: Yes    Types: Marijuana    Home Medications Prior to Admission medications   Medication Sig Start Date End Date Taking? Authorizing Provider  acetaminophen (TYLENOL) 500 MG tablet Take 1,000 mg by mouth every 6 (six) hours as needed for mild pain.   Yes [provider]  ciprofloxacin (CIPRO) 500 MG tablet Take 1 tablet (500 mg total) by mouth 2 (two) times daily. 09/04/20  Yes Linwood Dibbles, MD  Multiple Vitamin (MULTIVITAMIN WITH MINERALS) TABS tablet Take 1 tablet by mouth daily.   Yes [provider]  naproxen (NAPROSYN) 500 MG tablet Take 1 tablet (500 mg total) by mouth 2 (two) times daily with a meal. As needed for pain 09/04/20  Yes Linwood Dibbles, MD  albuterol (VENTOLIN HFA) 108 (90 Base)  MCG/ACT inhaler Inhale 2 puffs into the lungs every 4 (four) hours as needed for wheezing or shortness of breath. 04/10/20 05/10/20  Claude Manges, PA-C    Allergies    Tylenol [acetaminophen] and Vicodin [hydrocodone-acetaminophen]  Review of Systems   Review of Systems  All other systems reviewed and are negative.   Physical Exam Updated Vital Signs BP 97/81 (BP Location: Left Arm)   Pulse 63   Temp 98.2 F (36.8 C) (Oral)   Resp 18   Ht 1.727 m (5\' 8" )   Wt 59 kg   SpO2 100%   BMI 19.77 kg/m   Physical Exam Vitals and nursing note reviewed.  Constitutional:      General: He is not in acute distress.    Appearance: He is well-developed.  HENT:     Head: Normocephalic and atraumatic.     Right Ear: External ear normal.     Left Ear: External ear normal.  Eyes:     General: No scleral icterus.       Right eye: No discharge.        Left eye: No discharge.     Conjunctiva/sclera: Conjunctivae normal.  Neck:     Trachea: No tracheal deviation.  Cardiovascular:     Rate and Rhythm: Normal rate and regular rhythm.  Pulmonary:     Effort: Pulmonary effort is normal. No respiratory distress.     Breath sounds: Normal breath sounds. No stridor. No wheezing or rales.  Abdominal:     General: Bowel sounds are normal. There is no distension.     Palpations: Abdomen is soft.     Tenderness: There is no abdominal tenderness. There is no guarding or rebound.  Genitourinary:    Pubic Area: No rash.      Penis: Normal. No tenderness.      Testes:        Right: Mass or tenderness not present.        Left: Mass, tenderness or swelling not present.  Musculoskeletal:        General: No tenderness.     Cervical back: Neck supple.  Skin:    General: Skin is warm and dry.     Findings: No rash.  Neurological:     Mental Status: He is alert.     Cranial Nerves: No cranial nerve deficit (no facial droop, extraocular movements intact, no slurred speech).     Sensory: No sensory  deficit.     Motor: No abnormal muscle tone or seizure activity.     Coordination: Coordination normal.     ED Results / Procedures / Treatments   Labs (all labs ordered are listed, but only abnormal results are displayed) Labs Reviewed  URINALYSIS, ROUTINE W REFLEX MICROSCOPIC - Abnormal; Notable for the following components:      Result Value   Color, Urine AMBER (*)    Specific Gravity, Urine 1.032 (*)    Ketones, ur 5 (*)    Protein, ur 30 (*)    All other components within normal limits  CBC  BASIC METABOLIC PANEL  GC/CHLAMYDIA PROBE AMP ([place]) NOT AT [facility]    EKG None  Radiology US SCROTUM W/DOPPLER  Result Date: 09/04/2020 CLINICAL DATA:  Acute onset left testicular pain this morning. EXAM: SCROTAL ULTRASOUND DOPPLER ULTRASOUND OF THE TESTICLES TECHNIQUE: Complete ultrasound examination of the testicles, epididymis, and other scrotal structures was performed. Color and spectral Doppler ultrasound were also utilized to evaluate blood flow to the testicles. COMPARISON:  None. FINDINGS: Right testicle Measurements: 4.7 x 2.2 x 3.0. No mass or microlithiasis visualized. Left testicle Measurements: 4.2 x 2.3 x 3.0. No mass or microlithiasis visualized. Right epididymis:  Normal in size and appearance. Left epididymis:  Normal in size and appearance. Hydrocele:  Very small left hydrocele noted. Varicocele:  None visualized. Pulsed Doppler interrogation of both testes demonstrates normal low resistance arterial and venous waveforms bilaterally. IMPRESSION: Very small left hydrocele.  Otherwise negative. Electronically Signed   By: Drusilla Kanner M.D.   On: 09/04/2020 11:21    Procedures Procedures   Medications Ordered in ED Medications  ketorolac (TORADOL) 30 MG/ML injection 30 mg (30 mg Intravenous Given 09/04/20 0623)    ED Course  I have reviewed the triage vital signs and the nursing notes.  Pertinent labs & imaging results that were available during my care of the  patient were reviewed by me and considered in my medical decision making (see chart for details).  Clinical Course as of 09/04/20 1313  Fri Sep 04, 2020  1142 Ultrasound shows small hydrocele otherwise negative [JK]  1308 Ua negative [JK]    Clinical Course User Index [JK] Linwood Dibbles, MD   MDM Rules/Calculators/A&P  Patient presented to ED for evaluation of testicle pain.  On exam patient did not have hernia.  No mass appreciated.  Was concerned about the possibility of torsion so ultrasound was performed.  Ultrasound shows normal blood flow no acute abnormalities other than small hydrocele.  Patient does have tenderness in the epididymis.  He states he has not had intercourse in 9 months.  We will treat him for epididymitis. Final Clinical Impression(s) / ED Diagnoses Final diagnoses:  Epididymitis    Rx / DC Orders ED Discharge Orders         Ordered    ciprofloxacin (CIPRO) 500 MG tablet  2 times daily        09/04/20 1310    naproxen (NAPROSYN) 500 MG tablet  2 times daily with meals        09/04/20 1310           Linwood Dibbles, MD 09/04/20 1314

## 2021-07-03 IMAGING — CR DG CHEST 2V
2 series · 2 of 2 positions shown · non-contrast
Comparison: 09/12/2019

CLINICAL DATA: Chest pain short of breath

EXAM:
CHEST - 2 VIEW

[w chest pa]
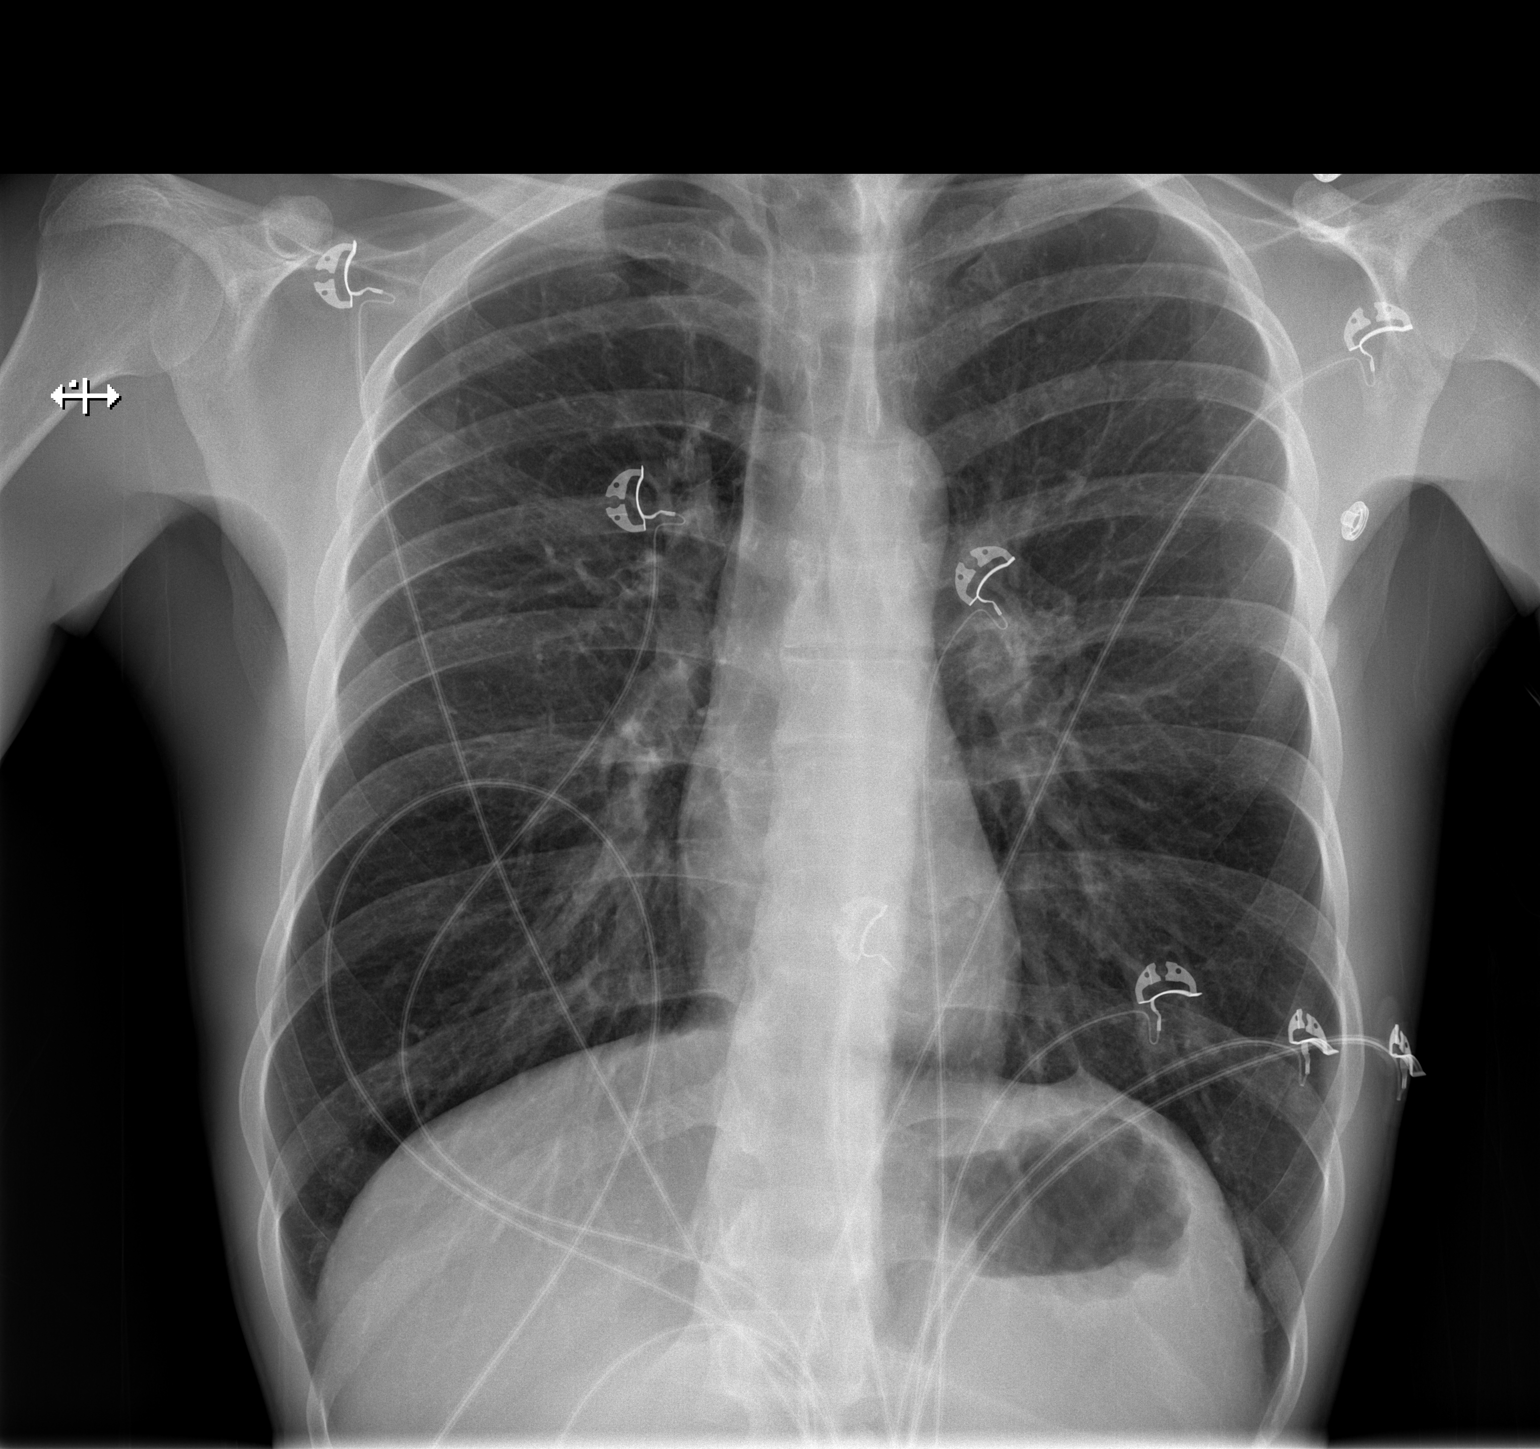

[w chest lat]
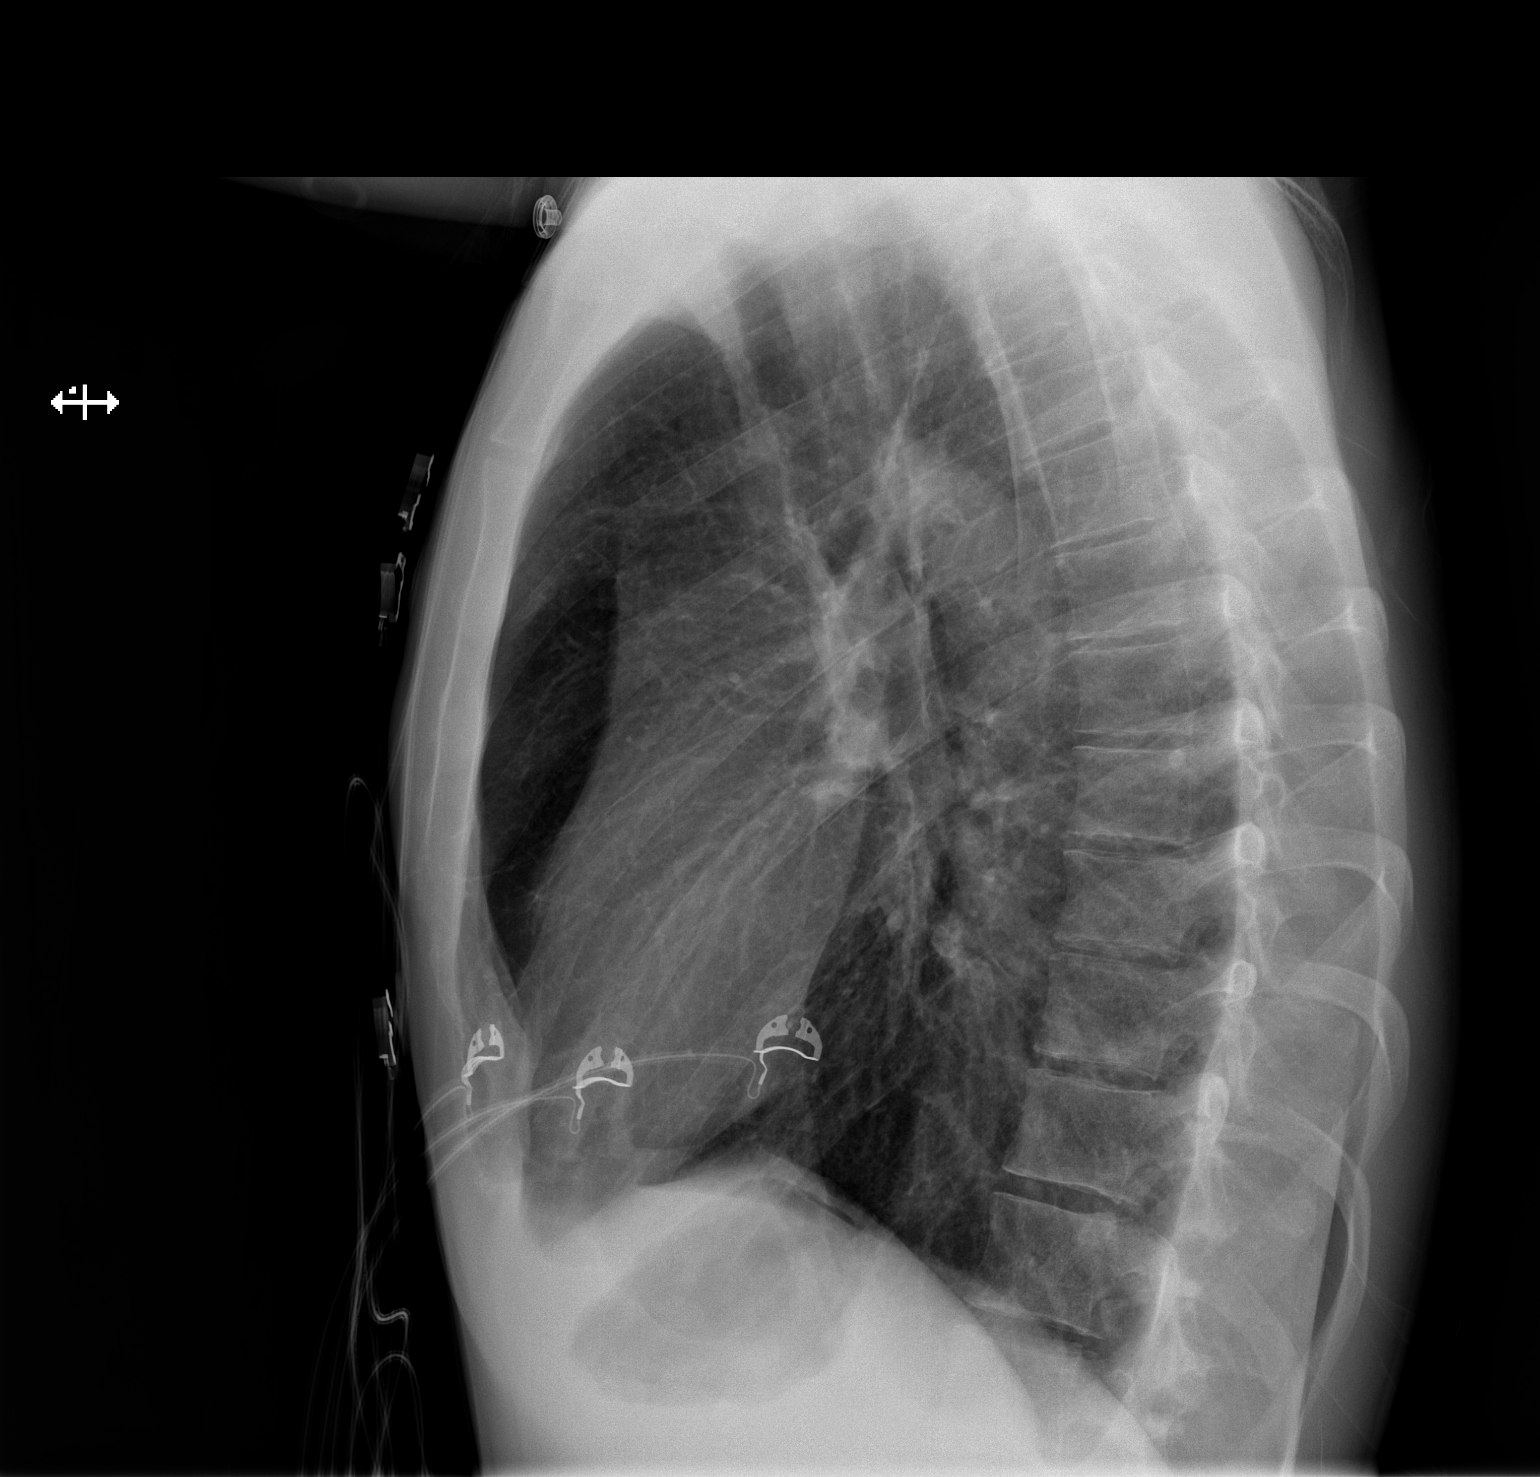

[2 of 2 positions shown; findings below may reference images not displayed]

FINDINGS: The heart size and mediastinal contours are within normal limits.
Both lungs are clear. The visualized skeletal structures are
unremarkable.
IMPRESSION: No active cardiopulmonary disease.

## 2021-11-27 IMAGING — US US SCROTUM W/ DOPPLER COMPLETE
1 series · 14 of 25 positions shown · non-contrast
Comparison: None.

CLINICAL DATA: Acute onset left testicular pain this morning.

EXAM:
SCROTAL ULTRASOUND
DOPPLER ULTRASOUND OF THE TESTICLES
TECHNIQUE: Complete ultrasound examination of the testicles, epididymis, and
other scrotal structures was performed. Color and spectral Doppler
ultrasound were also utilized to evaluate blood flow to the
testicles.

[Series 1: us scrotum w/ doppler complete · 106 acquisitions, 14 frames shown]
[im 1/106]
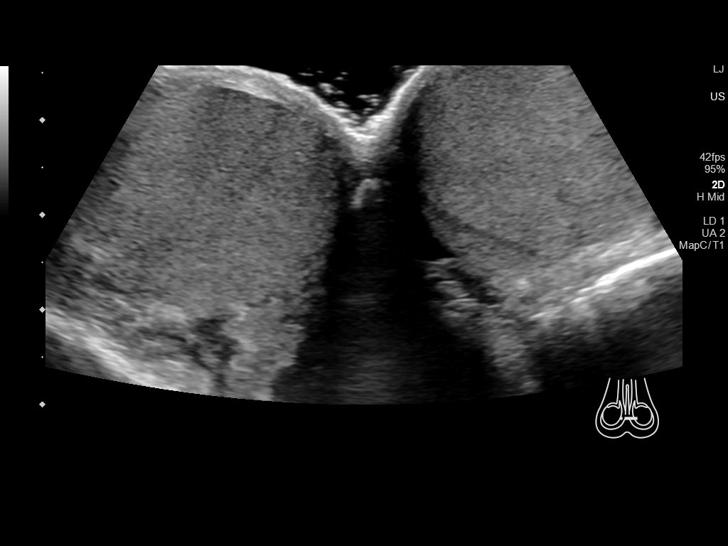
[im 9/106]
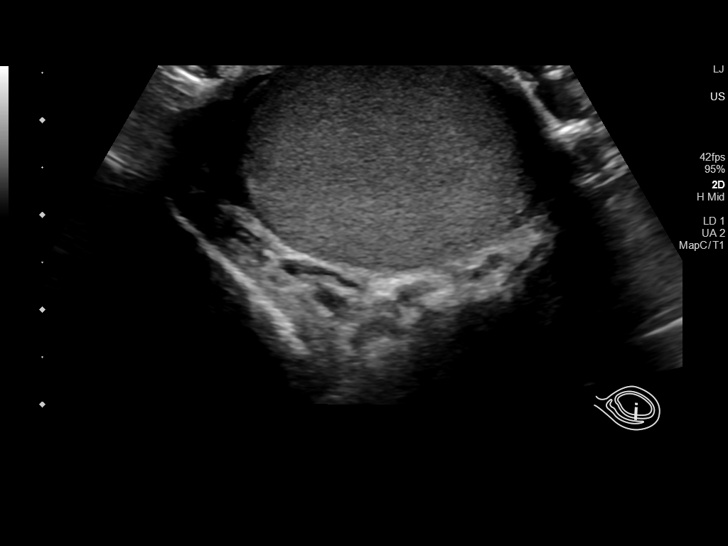
[im 18/106]
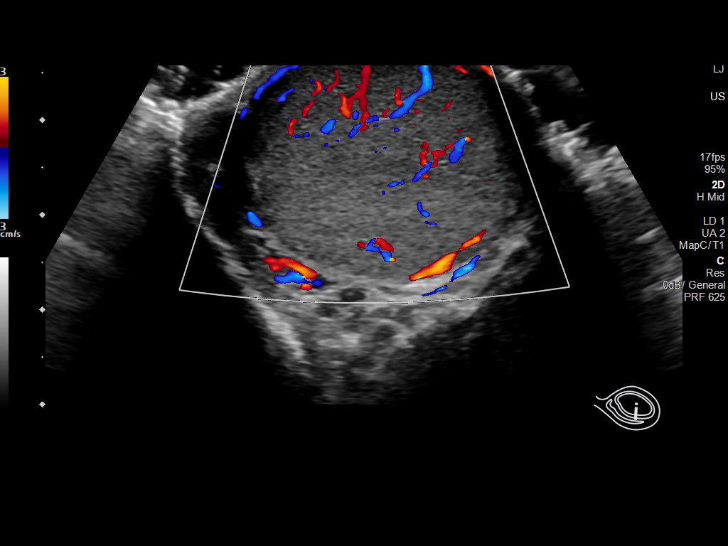
[im 27/106]
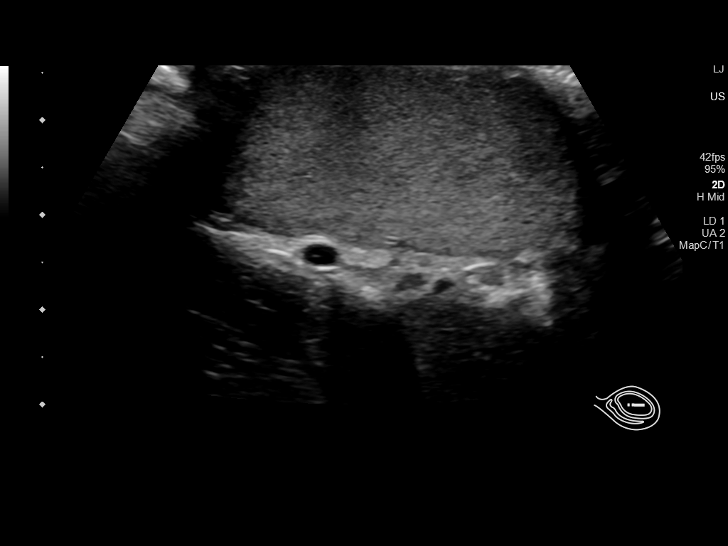
[im 36/106]
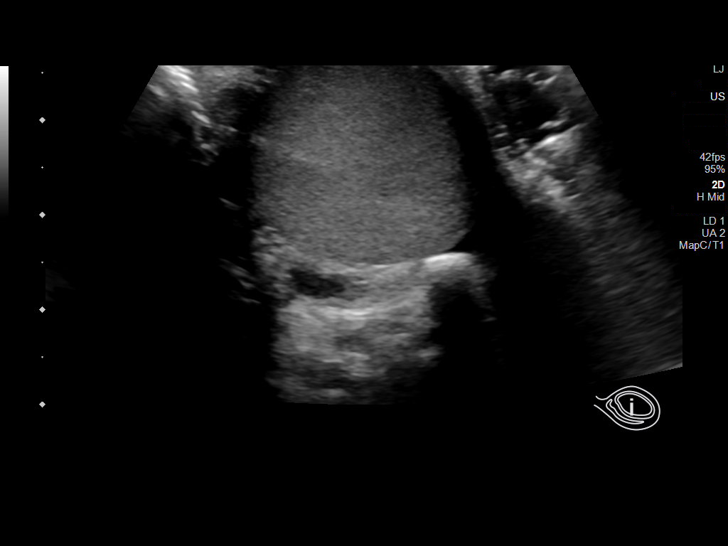
[im 40/106]
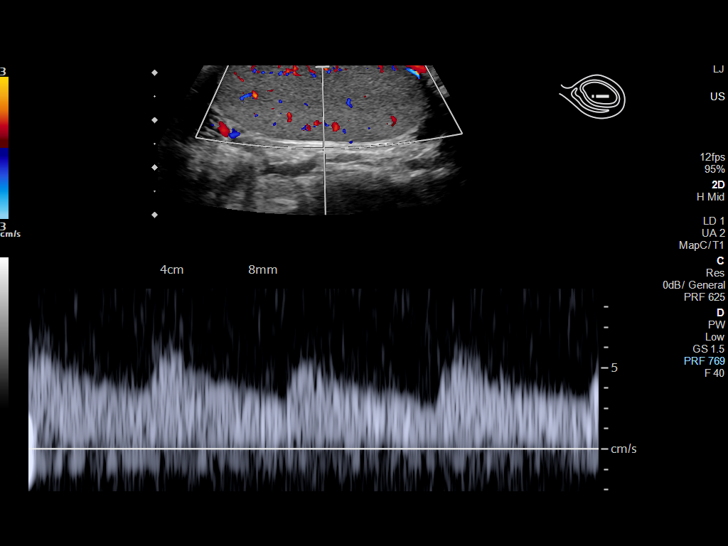
[im 49/106]
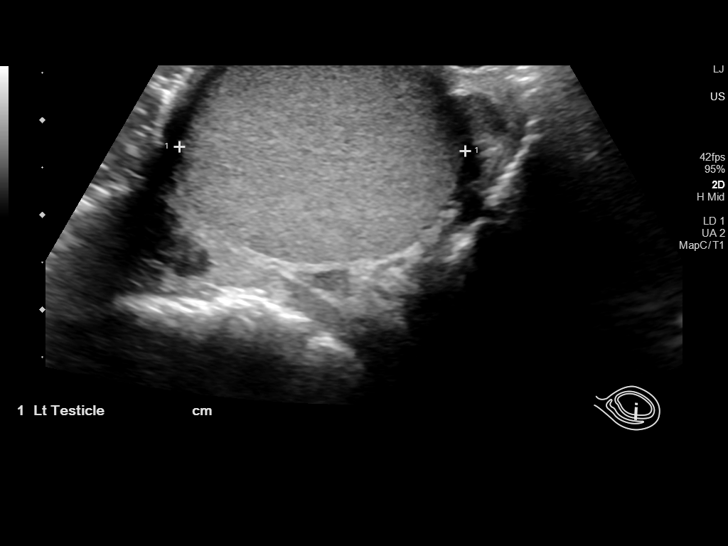
[im 57/106]
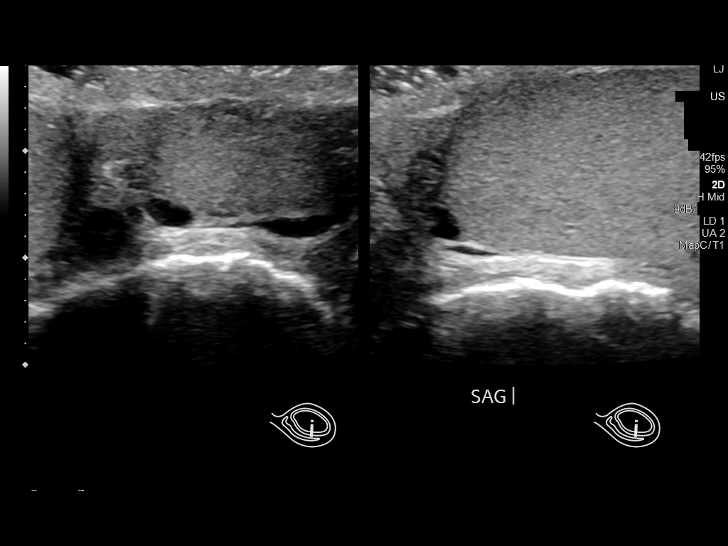
[im 66/106]
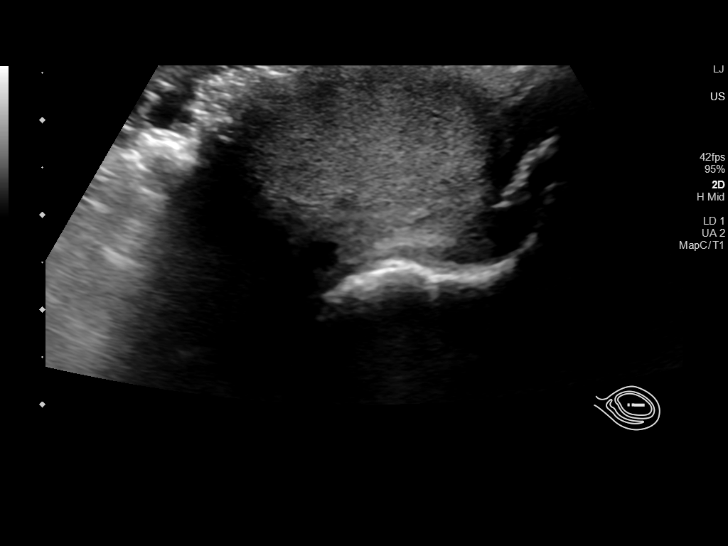
[im 71/106]
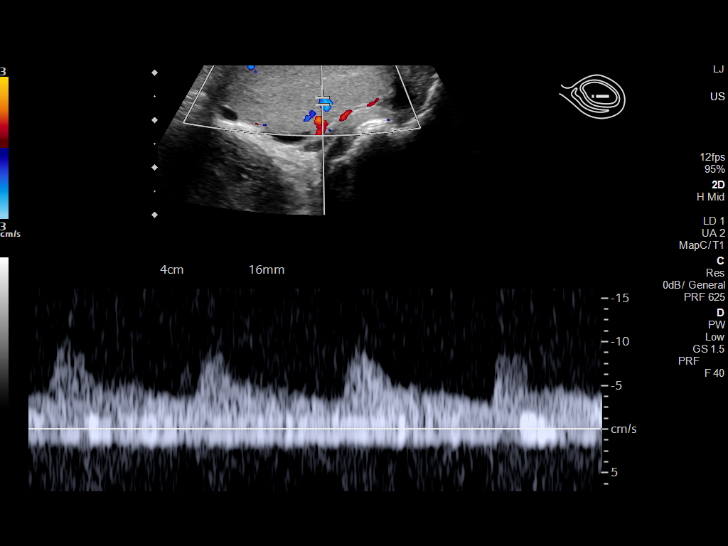
[im 79/106]
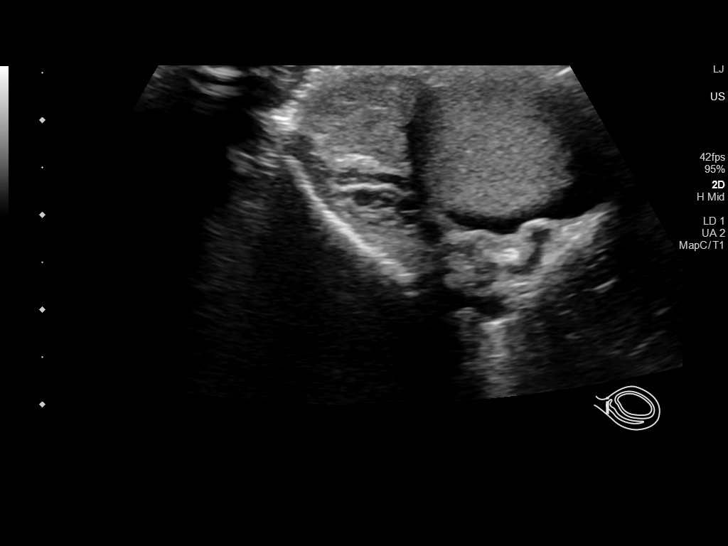
[im 88/106]
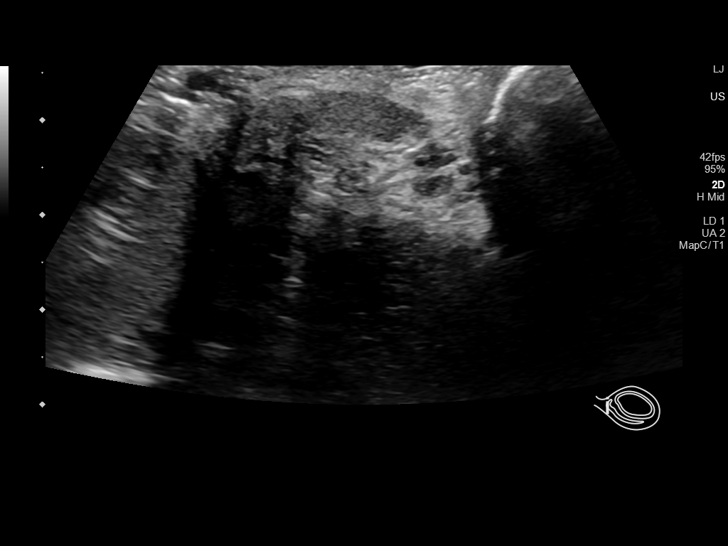
[im 97/106]
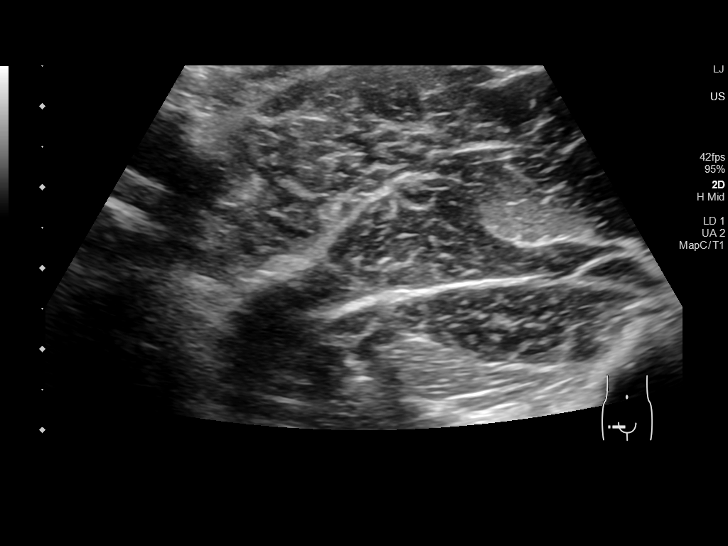
[im 106/106]
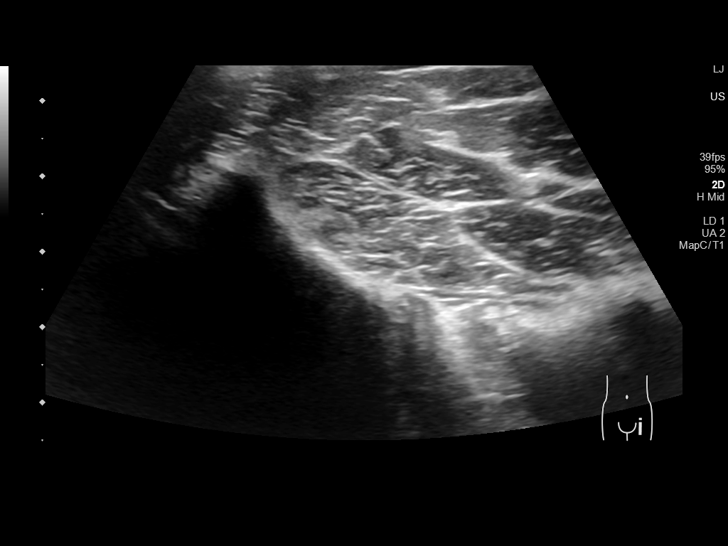

[14 of 25 positions shown; findings below may reference images not displayed]

FINDINGS: Right testicle

Measurements: 4.7 x 2.2 x 3.0. No mass or microlithiasis visualized.

Left testicle

Measurements: 4.2 x 2.3 x 3.0. No mass or microlithiasis visualized.

Right epididymis:  Normal in size and appearance.

Left epididymis:  Normal in size and appearance.

Hydrocele:  Very small left hydrocele noted.

Varicocele:  None visualized.

Pulsed Doppler interrogation of both testes demonstrates normal low
resistance arterial and venous waveforms bilaterally.
IMPRESSION: Very small left hydrocele.  Otherwise negative.

## 2023-06-02 ENCOUNTER — Encounter (HOSPITAL_BASED_OUTPATIENT_CLINIC_OR_DEPARTMENT_OTHER): Payer: Self-pay | Admitting: Emergency Medicine

## 2023-06-02 ENCOUNTER — Inpatient Hospital Stay (HOSPITAL_BASED_OUTPATIENT_CLINIC_OR_DEPARTMENT_OTHER)
Admission: EM | Admit: 2023-06-02 | Discharge: 2023-06-13 | DRG: 871 | Disposition: A | Discharge status: Home / Self Care | Payer: MEDICAID | Attending: Internal Medicine | Admitting: Internal Medicine

## 2023-06-02 ENCOUNTER — Other Ambulatory Visit: Payer: Self-pay

## 2023-06-02 ENCOUNTER — Emergency Department (HOSPITAL_BASED_OUTPATIENT_CLINIC_OR_DEPARTMENT_OTHER): Payer: MEDICAID

## 2023-06-02 DIAGNOSIS — Z886 Allergy status to analgesic agent status: Secondary | ICD-10-CM

## 2023-06-02 DIAGNOSIS — E876 Hypokalemia: Principal | ICD-10-CM | POA: Diagnosis present

## 2023-06-02 DIAGNOSIS — D6489 Other specified anemias: Secondary | ICD-10-CM | POA: Diagnosis present

## 2023-06-02 DIAGNOSIS — J181 Lobar pneumonia, unspecified organism: Secondary | ICD-10-CM | POA: Diagnosis present

## 2023-06-02 DIAGNOSIS — Z6820 Body mass index (BMI) 20.0-20.9, adult: Secondary | ICD-10-CM

## 2023-06-02 DIAGNOSIS — R6520 Severe sepsis without septic shock: Secondary | ICD-10-CM | POA: Diagnosis present

## 2023-06-02 DIAGNOSIS — B182 Chronic viral hepatitis C: Secondary | ICD-10-CM | POA: Diagnosis present

## 2023-06-02 DIAGNOSIS — J189 Pneumonia, unspecified organism: Secondary | ICD-10-CM | POA: Diagnosis present

## 2023-06-02 DIAGNOSIS — J869 Pyothorax without fistula: Secondary | ICD-10-CM

## 2023-06-02 DIAGNOSIS — E860 Dehydration: Secondary | ICD-10-CM | POA: Diagnosis present

## 2023-06-02 DIAGNOSIS — J439 Emphysema, unspecified: Secondary | ICD-10-CM | POA: Diagnosis present

## 2023-06-02 DIAGNOSIS — R109 Unspecified abdominal pain: Secondary | ICD-10-CM

## 2023-06-02 DIAGNOSIS — K056 Periodontal disease, unspecified: Secondary | ICD-10-CM | POA: Diagnosis present

## 2023-06-02 DIAGNOSIS — Z5986 Financial insecurity: Secondary | ICD-10-CM

## 2023-06-02 DIAGNOSIS — A419 Sepsis, unspecified organism: Principal | ICD-10-CM | POA: Diagnosis present

## 2023-06-02 DIAGNOSIS — E43 Unspecified severe protein-calorie malnutrition: Secondary | ICD-10-CM | POA: Diagnosis present

## 2023-06-02 DIAGNOSIS — F1910 Other psychoactive substance abuse, uncomplicated: Secondary | ICD-10-CM

## 2023-06-02 DIAGNOSIS — F1110 Opioid abuse, uncomplicated: Secondary | ICD-10-CM | POA: Diagnosis present

## 2023-06-02 DIAGNOSIS — E871 Hypo-osmolality and hyponatremia: Secondary | ICD-10-CM | POA: Diagnosis present

## 2023-06-02 DIAGNOSIS — Z681 Body mass index (BMI) 19 or less, adult: Secondary | ICD-10-CM

## 2023-06-02 DIAGNOSIS — R64 Cachexia: Secondary | ICD-10-CM | POA: Diagnosis present

## 2023-06-02 DIAGNOSIS — F1410 Cocaine abuse, uncomplicated: Secondary | ICD-10-CM | POA: Diagnosis present

## 2023-06-02 DIAGNOSIS — R197 Diarrhea, unspecified: Secondary | ICD-10-CM

## 2023-06-02 DIAGNOSIS — Z1152 Encounter for screening for COVID-19: Secondary | ICD-10-CM

## 2023-06-02 DIAGNOSIS — R0682 Tachypnea, not elsewhere classified: Secondary | ICD-10-CM | POA: Diagnosis present

## 2023-06-02 DIAGNOSIS — Z833 Family history of diabetes mellitus: Secondary | ICD-10-CM

## 2023-06-02 DIAGNOSIS — J90 Pleural effusion, not elsewhere classified: Secondary | ICD-10-CM | POA: Diagnosis present

## 2023-06-02 DIAGNOSIS — K029 Dental caries, unspecified: Secondary | ICD-10-CM | POA: Diagnosis present

## 2023-06-02 DIAGNOSIS — F17210 Nicotine dependence, cigarettes, uncomplicated: Secondary | ICD-10-CM | POA: Diagnosis present

## 2023-06-02 DIAGNOSIS — Z9282 Status post administration of tPA (rtPA) in a different facility within the last 24 hours prior to admission to current facility: Secondary | ICD-10-CM

## 2023-06-02 DIAGNOSIS — Z885 Allergy status to narcotic agent status: Secondary | ICD-10-CM

## 2023-06-02 DIAGNOSIS — K5900 Constipation, unspecified: Secondary | ICD-10-CM | POA: Diagnosis present

## 2023-06-02 LAB — URINALYSIS, ROUTINE W REFLEX MICROSCOPIC
Bacteria, UA: NONE SEEN
Bilirubin Urine: NEGATIVE
Glucose, UA: NEGATIVE mg/dL
Hgb urine dipstick: NEGATIVE
Ketones, ur: NEGATIVE mg/dL
Leukocytes,Ua: NEGATIVE
Nitrite: NEGATIVE
Specific Gravity, Urine: 1.021 (ref 1.005–1.030)
pH: 6 (ref 5.0–8.0)

## 2023-06-02 LAB — CBC WITH DIFFERENTIAL/PLATELET
Abs Immature Granulocytes: 0.4 10*3/uL — ABNORMAL HIGH (ref 0.00–0.07)
Band Neutrophils: 6 %
Basophils Absolute: 0 10*3/uL (ref 0.0–0.1)
Basophils Relative: 0 %
Eosinophils Absolute: 0 10*3/uL (ref 0.0–0.5)
Eosinophils Relative: 0 %
HCT: 38.7 % — ABNORMAL LOW (ref 39.0–52.0)
Hemoglobin: 13.3 g/dL (ref 13.0–17.0)
Lymphocytes Relative: 7 %
Lymphs Abs: 2.5 10*3/uL (ref 0.7–4.0)
MCH: 30.4 pg (ref 26.0–34.0)
MCHC: 34.4 g/dL (ref 30.0–36.0)
MCV: 88.4 fL (ref 80.0–100.0)
Metamyelocytes Relative: 1 %
Monocytes Absolute: 1.4 10*3/uL — ABNORMAL HIGH (ref 0.1–1.0)
Monocytes Relative: 4 %
Neutro Abs: 31.6 10*3/uL — ABNORMAL HIGH (ref 1.7–7.7)
Neutrophils Relative %: 82 %
Platelets: 348 10*3/uL (ref 150–400)
RBC: 4.38 MIL/uL (ref 4.22–5.81)
RDW: 13.8 % (ref 11.5–15.5)
WBC: 35.9 10*3/uL — ABNORMAL HIGH (ref 4.0–10.5)
nRBC: 0 % (ref 0.0–0.2)

## 2023-06-02 LAB — COMPREHENSIVE METABOLIC PANEL
ALT: 13 U/L (ref 0–44)
AST: 18 U/L (ref 15–41)
Albumin: 2.8 g/dL — ABNORMAL LOW (ref 3.5–5.0)
Alkaline Phosphatase: 260 U/L — ABNORMAL HIGH (ref 38–126)
Anion gap: 13 (ref 5–15)
BUN: 37 mg/dL — ABNORMAL HIGH (ref 6–20)
CO2: 22 mmol/L (ref 22–32)
Calcium: 8.6 mg/dL — ABNORMAL LOW (ref 8.9–10.3)
Chloride: 98 mmol/L (ref 98–111)
Creatinine, Ser: 0.92 mg/dL (ref 0.61–1.24)
GFR, Estimated: >60 mL/min (ref >60)
Glucose, Bld: 107 mg/dL — ABNORMAL HIGH (ref 70–99)
Potassium: 3.2 mmol/L — ABNORMAL LOW (ref 3.5–5.1)
Sodium: 133 mmol/L — ABNORMAL LOW (ref 135–145)
Total Bilirubin: 0.7 mg/dL (ref 0.0–1.2)
Total Protein: 7 g/dL (ref 6.5–8.1)

## 2023-06-02 LAB — MAGNESIUM: Magnesium: 2.6 mg/dL — ABNORMAL HIGH (ref 1.7–2.4)

## 2023-06-02 LAB — RESP PANEL BY RT-PCR (RSV, FLU A&B, COVID)  RVPGX2
Influenza A by PCR: NEGATIVE
Influenza B by PCR: NEGATIVE
Resp Syncytial Virus by PCR: NEGATIVE
SARS Coronavirus 2 by RT PCR: NEGATIVE

## 2023-06-02 MED ORDER — GUAIFENESIN-DM 100-10 MG/5ML PO SYRP
10.0000 mL | ORAL_SOLUTION | Freq: Once | ORAL | Status: AC
  Administered 2023-06-02: 10 mL via ORAL
  Filled 2023-06-02: qty 10

## 2023-06-02 MED ORDER — SODIUM CHLORIDE 0.9 % IV SOLN
500.0000 mg | Freq: Once | INTRAVENOUS | Status: AC
  Administered 2023-06-03: 500 mg via INTRAVENOUS
  Filled 2023-06-02: qty 5

## 2023-06-02 MED ORDER — LORAZEPAM 2 MG/ML IJ SOLN
1.0000 mg | Freq: Once | INTRAMUSCULAR | Status: AC
  Administered 2023-06-02: 1 mg via INTRAVENOUS
  Filled 2023-06-02: qty 1

## 2023-06-02 MED ORDER — KETOROLAC TROMETHAMINE 15 MG/ML IJ SOLN
15.0000 mg | Freq: Once | INTRAMUSCULAR | Status: AC
  Administered 2023-06-02: 15 mg via INTRAVENOUS
  Filled 2023-06-02: qty 1

## 2023-06-02 MED ORDER — SODIUM CHLORIDE 0.9 % IV SOLN
1.0000 g | Freq: Once | INTRAVENOUS | Status: AC
  Administered 2023-06-02: 1 g via INTRAVENOUS
  Filled 2023-06-02: qty 10

## 2023-06-02 MED ORDER — MORPHINE SULFATE (PF) 4 MG/ML IV SOLN
4.0000 mg | Freq: Once | INTRAVENOUS | Status: AC
  Administered 2023-06-02: 4 mg via INTRAVENOUS
  Filled 2023-06-02: qty 1

## 2023-06-02 MED ORDER — METHOCARBAMOL 500 MG PO TABS
500.0000 mg | ORAL_TABLET | Freq: Once | ORAL | Status: AC
  Administered 2023-06-02: 500 mg via ORAL
  Filled 2023-06-02: qty 1

## 2023-06-02 MED ORDER — METRONIDAZOLE 500 MG/100ML IV SOLN: 500.0000 mg | Freq: Once | INTRAVENOUS | Status: DC

## 2023-06-02 MED ORDER — SODIUM CHLORIDE 0.9 % IV BOLUS
1000.0000 mL | Freq: Once | INTRAVENOUS | Status: AC
  Administered 2023-06-02: 1000 mL via INTRAVENOUS

## 2023-06-02 MED ORDER — IOHEXOL 300 MG/ML  SOLN
100.0000 mL | Freq: Once | INTRAMUSCULAR | Status: AC | PRN
  Administered 2023-06-02: 100 mL via INTRAVENOUS

## 2023-06-02 MED ORDER — POTASSIUM CHLORIDE CRYS ER 20 MEQ PO TBCR
40.0000 meq | EXTENDED_RELEASE_TABLET | Freq: Once | ORAL | Status: AC
  Administered 2023-06-02: 40 meq via ORAL
  Filled 2023-06-02: qty 2

## 2023-06-02 NOTE — ED Notes (Signed)
Pt placed on 2lt [place] due to SATS of 88%. Pt was given morphine pry er to SATS  dropping

## 2023-06-02 NOTE — ED Triage Notes (Signed)
Pt arrived via [facility] from home caox4 c/o L flank pain x4 days, pt states he has had flu-like symptoms for 12 days - cough, congestion, bodyaches, poor appetite, diarrhea.   EMS VS BP 110/60  HR 100 SpO2 100% RA  RR 26

## 2023-06-02 NOTE — ED Notes (Signed)
Pt finally able to communicate. Pt states he was sick w/ flu like symptoms starting on the 19th that resolved 4 days ago. Pt states the left sided pain began from shoulder to leg and progressively got worse

## 2023-06-02 NOTE — ED Provider Notes (Signed)
[place] EMERGENCY DEPARTMENT AT [facility] Provider Note   CSN: [id number] Arrival date & time: 06/02/23  1805     History  Chief Complaint  Patient presents with   Flank Pain    Brent Bryant is a 33 y.o. male.  Patient is a 33year-old male with past medical history of hepatitis C presenting for flank pain.  Patient admits to severe left-sided flank pain and/or left-sided back pain that has been worsening over the past 4 days.  He admits to flulike symptoms including body aches, nasal congestion, poor appetite, nausea without vomiting, and diarrhea x 12 days.  Denies abdominal pain.  Hematuria, increased frequency or urgency.  Denies black or bloody stools.  The history is provided by the patient. No language interpreter was used.  Flank Pain Pertinent negatives include no chest pain, no abdominal pain and no shortness of breath.       Home Medications Prior to Admission medications   Medication Sig Start Date End Date Taking? Authorizing Provider  acetaminophen (TYLENOL) 500 MG tablet Take 1,000 mg by mouth every 6 (six) hours as needed for mild pain.    [provider]  albuterol (VENTOLIN HFA) 108 (90 Base) MCG/ACT inhaler Inhale 2 puffs into the lungs every 4 (four) hours as needed for wheezing or shortness of breath. 04/10/20 05/10/20  Claude Manges, PA-C  ciprofloxacin (CIPRO) 500 MG tablet Take 1 tablet (500 mg total) by mouth 2 (two) times daily. 09/04/20   Linwood Dibbles, MD  Multiple Vitamin (MULTIVITAMIN WITH MINERALS) TABS tablet Take 1 tablet by mouth daily.    [provider]  naproxen (NAPROSYN) 500 MG tablet Take 1 tablet (500 mg total) by mouth 2 (two) times daily with a meal. As needed for pain 09/04/20   Linwood Dibbles, MD      Allergies    Tylenol [acetaminophen] and Vicodin [hydrocodone-acetaminophen]    Review of Systems   Review of Systems  Constitutional:  Positive for chills and fever.  HENT:  Positive for congestion. Negative for ear  pain and sore throat.   Eyes:  Negative for pain and visual disturbance.  Respiratory:  Negative for cough and shortness of breath.   Cardiovascular:  Negative for chest pain and palpitations.  Gastrointestinal:  Positive for diarrhea and nausea. Negative for abdominal pain and vomiting.  Genitourinary:  Positive for flank pain. Negative for dysuria and hematuria.  Musculoskeletal:  Positive for back pain. Negative for arthralgias.  Skin:  Negative for color change and rash.  Neurological:  Negative for seizures and syncope.  All other systems reviewed and are negative.   Physical Exam Updated Vital Signs BP 111/70   Pulse 96   Temp 98 F (36.7 C)   Resp 18   SpO2 92%  Physical Exam Vitals and nursing note reviewed.  Constitutional:      General: He is not in acute distress.    Appearance: He is well-developed.  HENT:     Head: Normocephalic and atraumatic.  Eyes:     Conjunctiva/sclera: Conjunctivae normal.  Cardiovascular:     Rate and Rhythm: Normal rate and regular rhythm.     Heart sounds: No murmur heard. Pulmonary:     Effort: Pulmonary effort is normal. No respiratory distress.     Breath sounds: Normal breath sounds.  Abdominal:     Palpations: Abdomen is soft.     Tenderness: There is no abdominal tenderness.  Musculoskeletal:        General: No swelling.  Cervical back: Normal and neck supple.     Thoracic back: Normal.     Lumbar back: Tenderness present. No bony tenderness.       Back:  Skin:    General: Skin is warm and dry.     Capillary Refill: Capillary refill takes less than 2 seconds.  Neurological:     Mental Status: He is alert.     GCS: GCS eye subscore is 4. GCS verbal subscore is 5. GCS motor subscore is 6.  Psychiatric:        Mood and Affect: Mood normal.     ED Results / Procedures / Treatments   Labs (all labs ordered are listed, but only abnormal results are displayed) Labs Reviewed  URINALYSIS, ROUTINE W REFLEX MICROSCOPIC -  Abnormal; Notable for the following components:      Result Value   Protein, ur TRACE (*)    All other components within normal limits  CBC WITH DIFFERENTIAL/PLATELET - Abnormal; Notable for the following components:   WBC 35.9 (*)    HCT 38.7 (*)    Neutro Abs 31.6 (*)    Monocytes Absolute 1.4 (*)    Abs Immature Granulocytes 0.40 (*)    All other components within normal limits  COMPREHENSIVE METABOLIC PANEL - Abnormal; Notable for the following components:   Sodium 133 (*)    Potassium 3.2 (*)    Glucose, Bld 107 (*)    BUN 37 (*)    Calcium 8.6 (*)    Albumin 2.8 (*)    Alkaline Phosphatase 260 (*)    All other components within normal limits  MAGNESIUM - Abnormal; Notable for the following components:   Magnesium 2.6 (*)    All other components within normal limits  RESP PANEL BY RT-PCR (RSV, FLU A&B, COVID)  [id number]    EKG None  Radiology No results found.  Procedures Procedures    Medications Ordered in ED Medications  cefTRIAXone (ROCEPHIN) 1 g in sodium chloride 0.9 % 100 mL IVPB (has no administration in time range)  metroNIDAZOLE (FLAGYL) IVPB 500 mg (has no administration in time range)  sodium chloride 0.9 % bolus 1,000 mL (0 mLs Intravenous Stopped 06/02/23 1945)  ketorolac (TORADOL) 15 MG/ML injection 15 mg (15 mg Intravenous Given 06/02/23 1909)  morphine (PF) 4 MG/ML injection 4 mg (4 mg Intravenous Given 06/02/23 1919)  methocarbamol (ROBAXIN) tablet 500 mg (500 mg Oral Given 06/02/23 1919)  potassium chloride SA (KLOR-CON M) CR tablet 40 mEq (40 mEq Oral Given 06/02/23 2112)  sodium chloride 0.9 % bolus 1,000 mL (0 mLs Intravenous Stopped 06/02/23 2140)  morphine (PF) 4 MG/ML injection 4 mg (4 mg Intravenous Given 06/02/23 2239)  guaiFENesin-dextromethorphan (ROBITUSSIN DM) 100-10 MG/5ML syrup 10 mL (10 mLs Oral Given 06/02/23 2341)  iohexol (OMNIPAQUE) 300 MG/ML solution 100 mL (100 mLs Intravenous Contrast Given 06/02/23 2327)    ED Course/ Medical  Decision Making/ A&P                                 Medical Decision Making Amount and/or Complexity of Data Reviewed Labs: ordered. Radiology: ordered.  Risk OTC drugs. Prescription drug management.    33year-old male with past medical history of hepatitis C presenting for flank pain.  Patient is alert oriented x 3, acute distress secondary to pain, afebrile, stable vital signs.  On physical exam patient is writhing around in the bed due to left-sided  pain.  He has left-sided lumbar paraspinal tenderness on exam and/or flank pain.  Abdomen is soft and nontender.  Patient given IV fluids, Zofran, and morphine for pain.  Laboratory studies reviewed and interpreted by myself concerning for leukocytosis of 35.9.  Hypokalemia present likely secondary to diarrhea.  Creatinine and GFR stable.  UA negative for hematuria or urinary tract infection.  Due to no microscopic hematuria have a low suspicion for nephrolithiasis.  CT abdomen pelvis with IV contrast ordered for concerns for intra-abdominal infection.  Rocephin and Flagyl given for concerns of infection.  Reevaluation patient has improvement of symptoms.  Patient signed out to oncoming provider while awaiting CT abdomen pelvis.        Final Clinical Impression(s) / ED Diagnoses Final diagnoses:  Hypokalemia  Diarrhea, unspecified type  Dehydration  Flank pain    Rx / DC Orders ED Discharge Orders     None         Franne Forts, DO 06/02/23 2343

## 2023-06-03 ENCOUNTER — Inpatient Hospital Stay (HOSPITAL_COMMUNITY): Payer: MEDICAID

## 2023-06-03 ENCOUNTER — Emergency Department (HOSPITAL_BASED_OUTPATIENT_CLINIC_OR_DEPARTMENT_OTHER): Payer: MEDICAID | Admitting: Radiology

## 2023-06-03 DIAGNOSIS — J189 Pneumonia, unspecified organism: Secondary | ICD-10-CM

## 2023-06-03 DIAGNOSIS — J90 Pleural effusion, not elsewhere classified: Secondary | ICD-10-CM

## 2023-06-03 DIAGNOSIS — E876 Hypokalemia: Secondary | ICD-10-CM

## 2023-06-03 LAB — EXPECTORATED SPUTUM ASSESSMENT W GRAM STAIN, RFLX TO RESP C: Special Requests: NORMAL

## 2023-06-03 LAB — I-STAT ARTERIAL BLOOD GAS, ED
Acid-base deficit: 5 mmol/L — ABNORMAL HIGH (ref 0.0–2.0)
Bicarbonate: 19.5 mmol/L — ABNORMAL LOW (ref 20.0–28.0)
Calcium, Ion: 1.18 mmol/L (ref 1.15–1.40)
HCT: 36 % — ABNORMAL LOW (ref 39.0–52.0)
Hemoglobin: 12.2 g/dL — ABNORMAL LOW (ref 13.0–17.0)
O2 Saturation: 93 %
Patient temperature: 98.3
Potassium: 3.9 mmol/L (ref 3.5–5.1)
Sodium: 134 mmol/L — ABNORMAL LOW (ref 135–145)
TCO2: 20 mmol/L — ABNORMAL LOW (ref 22–32)
pCO2 arterial: 33 mm[Hg] (ref 32–48)
pH, Arterial: 7.379 (ref 7.35–7.45)
pO2, Arterial: 66 mm[Hg] — ABNORMAL LOW (ref 83–108)

## 2023-06-03 LAB — RAPID URINE DRUG SCREEN, HOSP PERFORMED
Amphetamines: NOT DETECTED
Barbiturates: NOT DETECTED
Benzodiazepines: NOT DETECTED
Cocaine: POSITIVE — AB
Opiates: POSITIVE — AB
Tetrahydrocannabinol: POSITIVE — AB

## 2023-06-03 LAB — BODY FLUID CELL COUNT WITH DIFFERENTIAL
Eos, Fluid: 0 %
Lymphs, Fluid: 3 %
Monocyte-Macrophage-Serous Fluid: 15 % — ABNORMAL LOW (ref 50–90)
Neutrophil Count, Fluid: 82 % — ABNORMAL HIGH (ref 0–25)
Total Nucleated Cell Count, Fluid: 7700 uL — ABNORMAL HIGH (ref 0–1000)

## 2023-06-03 LAB — COMPREHENSIVE METABOLIC PANEL
ALT: 12 U/L (ref 0–44)
AST: 16 U/L (ref 15–41)
Albumin: <1.5 g/dL — ABNORMAL LOW (ref 3.5–5.0)
Alkaline Phosphatase: 163 U/L — ABNORMAL HIGH (ref 38–126)
Anion gap: 10 (ref 5–15)
BUN: 18 mg/dL (ref 6–20)
CO2: 17 mmol/L — ABNORMAL LOW (ref 22–32)
Calcium: 7.6 mg/dL — ABNORMAL LOW (ref 8.9–10.3)
Chloride: 99 mmol/L (ref 98–111)
Creatinine, Ser: 0.67 mg/dL (ref 0.61–1.24)
GFR, Estimated: >60 mL/min (ref >60)
Glucose, Bld: 79 mg/dL (ref 70–99)
Potassium: 3.9 mmol/L (ref 3.5–5.1)
Sodium: 126 mmol/L — ABNORMAL LOW (ref 135–145)
Total Bilirubin: 1.1 mg/dL (ref 0.0–1.2)
Total Protein: 5.5 g/dL — ABNORMAL LOW (ref 6.5–8.1)

## 2023-06-03 LAB — LACTATE DEHYDROGENASE, PLEURAL OR PERITONEAL FLUID: LD, Fluid: 1174 U/L — ABNORMAL HIGH (ref 3–23)

## 2023-06-03 LAB — CBC WITH DIFFERENTIAL/PLATELET
Abs Immature Granulocytes: 0 10*3/uL (ref 0.00–0.07)
Basophils Absolute: 0 10*3/uL (ref 0.0–0.1)
Basophils Relative: 0 %
Eosinophils Absolute: 0 10*3/uL (ref 0.0–0.5)
Eosinophils Relative: 0 %
HCT: 32.8 % — ABNORMAL LOW (ref 39.0–52.0)
Hemoglobin: 11.3 g/dL — ABNORMAL LOW (ref 13.0–17.0)
Lymphocytes Relative: 10 %
Lymphs Abs: 3.1 10*3/uL (ref 0.7–4.0)
MCH: 30.8 pg (ref 26.0–34.0)
MCHC: 34.5 g/dL (ref 30.0–36.0)
MCV: 89.4 fL (ref 80.0–100.0)
Monocytes Absolute: 1.6 10*3/uL — ABNORMAL HIGH (ref 0.1–1.0)
Monocytes Relative: 5 %
Neutro Abs: 26.5 10*3/uL — ABNORMAL HIGH (ref 1.7–7.7)
Neutrophils Relative %: 85 %
Platelets: 359 10*3/uL (ref 150–400)
RBC: 3.67 MIL/uL — ABNORMAL LOW (ref 4.22–5.81)
RDW: 14.3 % (ref 11.5–15.5)
WBC: 31.2 10*3/uL — ABNORMAL HIGH (ref 4.0–10.5)
nRBC: 0 % (ref 0.0–0.2)
nRBC: 0 /100{WBCs}

## 2023-06-03 LAB — GLUCOSE, PLEURAL OR PERITONEAL FLUID: Glucose, Fluid: 39 mg/dL

## 2023-06-03 LAB — PROTIME-INR
INR: 1.2 (ref 0.8–1.2)
Prothrombin Time: 15.1 s (ref 11.4–15.2)

## 2023-06-03 LAB — ETHANOL: Alcohol, Ethyl (B): <10 mg/dL (ref <10)

## 2023-06-03 LAB — PROCALCITONIN: Procalcitonin: 2.83 ng/mL

## 2023-06-03 LAB — HIV ANTIBODY (ROUTINE TESTING W REFLEX): HIV Screen 4th Generation wRfx: NONREACTIVE

## 2023-06-03 LAB — LACTATE DEHYDROGENASE: LDH: 165 U/L (ref 98–192)

## 2023-06-03 MED ORDER — KETAMINE HCL 10 MG/ML IJ SOLN
20.0000 mg | Freq: Once | INTRAMUSCULAR | Status: AC
  Administered 2023-06-03: 20 mg via INTRAVENOUS

## 2023-06-03 MED ORDER — HALOPERIDOL LACTATE 5 MG/ML IJ SOLN: 2.0000 mg | Freq: Once | INTRAMUSCULAR | Status: DC

## 2023-06-03 MED ORDER — LORAZEPAM 2 MG/ML IJ SOLN
2.0000 mg | Freq: Once | INTRAMUSCULAR | Status: AC
  Administered 2023-06-03: 1 mg via INTRAVENOUS
  Filled 2023-06-03: qty 1

## 2023-06-03 MED ORDER — KETOROLAC TROMETHAMINE 30 MG/ML IJ SOLN
30.0000 mg | Freq: Once | INTRAMUSCULAR | Status: AC
  Administered 2023-06-03: 30 mg via INTRAVENOUS
  Filled 2023-06-03: qty 1

## 2023-06-03 MED ORDER — HYDROMORPHONE HCL 1 MG/ML IJ SOLN
1.0000 mg | Freq: Once | INTRAMUSCULAR | Status: AC
  Administered 2023-06-03: 1 mg via INTRAVENOUS
  Filled 2023-06-03: qty 1

## 2023-06-03 MED ORDER — HYDROMORPHONE HCL 1 MG/ML IJ SOLN
1.0000 mg | INTRAMUSCULAR | Status: DC | PRN
  Administered 2023-06-03: 1 mg via INTRAVENOUS
  Administered 2023-06-03: 0.5 mg via INTRAVENOUS
  Administered 2023-06-04 (×2): 1 mg via INTRAVENOUS
  Filled 2023-06-03 (×4): qty 1

## 2023-06-03 MED ORDER — METHOCARBAMOL 1000 MG/10ML IJ SOLN
1000.0000 mg | Freq: Three times a day (TID) | INTRAMUSCULAR | Status: AC
  Administered 2023-06-03 – 2023-06-05 (×6): 1000 mg via INTRAVENOUS
  Filled 2023-06-03 (×6): qty 10

## 2023-06-03 MED ORDER — KETAMINE HCL 10 MG/ML IJ SOLN
20.0000 mg | Freq: Once | INTRAMUSCULAR | Status: AC
  Administered 2023-06-03: 20 mg via INTRAVENOUS
  Filled 2023-06-03: qty 1

## 2023-06-03 MED ORDER — DIPHENHYDRAMINE HCL 50 MG/ML IJ SOLN
25.0000 mg | INTRAMUSCULAR | Status: DC
Start: 2023-06-03 — End: 2023-06-03

## 2023-06-03 MED ORDER — OXYCODONE HCL 5 MG PO TABS
5.0000 mg | ORAL_TABLET | ORAL | Status: DC | PRN
  Administered 2023-06-04 – 2023-06-12 (×15): 10 mg via ORAL
  Filled 2023-06-03 (×17): qty 2

## 2023-06-03 MED ORDER — KETOROLAC TROMETHAMINE 15 MG/ML IJ SOLN
15.0000 mg | Freq: Once | INTRAMUSCULAR | Status: AC
  Administered 2023-06-03: 15 mg via INTRAVENOUS
  Filled 2023-06-03: qty 1

## 2023-06-03 MED ORDER — KETAMINE HCL 10 MG/ML IJ SOLN: 40.0000 mg | Freq: Once | INTRAMUSCULAR | Status: DC

## 2023-06-03 MED ORDER — HYDROMORPHONE HCL 1 MG/ML IJ SOLN
0.5000 mg | INTRAMUSCULAR | Status: DC | PRN
Start: 2023-06-03 — End: 2023-06-03

## 2023-06-03 MED ORDER — KETOROLAC TROMETHAMINE 30 MG/ML IJ SOLN
30.0000 mg | Freq: Four times a day (QID) | INTRAMUSCULAR | Status: DC
  Administered 2023-06-03 – 2023-06-05 (×9): 30 mg via INTRAVENOUS
  Filled 2023-06-03 (×9): qty 1

## 2023-06-03 MED ORDER — AZITHROMYCIN 500 MG IV SOLR
500.0000 mg | INTRAVENOUS | Status: AC
  Administered 2023-06-03 – 2023-06-04 (×2): 500 mg via INTRAVENOUS
  Filled 2023-06-03 (×2): qty 5

## 2023-06-03 MED ORDER — SODIUM CHLORIDE 0.9 % IV SOLN
3.0000 g | Freq: Four times a day (QID) | INTRAVENOUS | Status: DC
  Administered 2023-06-03 – 2023-06-12 (×36): 3 g via INTRAVENOUS
  Filled 2023-06-03 (×36): qty 8

## 2023-06-03 MED ORDER — VANCOMYCIN HCL IN DEXTROSE 1-5 GM/200ML-% IV SOLN
1000.0000 mg | Freq: Two times a day (BID) | INTRAVENOUS | Status: DC
  Administered 2023-06-03 – 2023-06-05 (×4): 1000 mg via INTRAVENOUS
  Filled 2023-06-03 (×5): qty 200

## 2023-06-03 MED ORDER — LEVALBUTEROL HCL 0.63 MG/3ML IN NEBU
0.6300 mg | INHALATION_SOLUTION | Freq: Four times a day (QID) | RESPIRATORY_TRACT | Status: DC
  Administered 2023-06-04: 0.63 mg via RESPIRATORY_TRACT
  Filled 2023-06-03: qty 3

## 2023-06-03 MED ORDER — SODIUM CHLORIDE 0.9 % IV BOLUS
500.0000 mL | Freq: Once | INTRAVENOUS | Status: AC
  Administered 2023-06-03: 500 mL via INTRAVENOUS

## 2023-06-03 MED ORDER — SODIUM CHLORIDE 0.9 % IV SOLN: INTRAVENOUS | Status: AC

## 2023-06-03 MED ORDER — ACETAMINOPHEN 325 MG PO TABS
650.0000 mg | ORAL_TABLET | Freq: Four times a day (QID) | ORAL | Status: DC | PRN
  Administered 2023-06-04 – 2023-06-06 (×2): 650 mg via ORAL
  Filled 2023-06-03 (×3): qty 2

## 2023-06-03 MED ORDER — FENTANYL CITRATE PF 50 MCG/ML IJ SOSY
50.0000 ug | PREFILLED_SYRINGE | Freq: Once | INTRAMUSCULAR | Status: AC
  Administered 2023-06-03: 50 ug via INTRAVENOUS
  Filled 2023-06-03: qty 1

## 2023-06-03 MED ORDER — LORAZEPAM 2 MG/ML IJ SOLN
1.0000 mg | INTRAMUSCULAR | Status: AC
  Administered 2023-06-03: 1 mg via INTRAVENOUS
  Filled 2023-06-03: qty 1

## 2023-06-03 MED ORDER — ALBUTEROL SULFATE (2.5 MG/3ML) 0.083% IN NEBU: 2.5000 mg | INHALATION_SOLUTION | RESPIRATORY_TRACT | Status: DC | PRN

## 2023-06-03 MED ORDER — OXYCODONE HCL 5 MG PO TABS: 5.0000 mg | ORAL_TABLET | ORAL | Status: DC | PRN

## 2023-06-03 MED ORDER — LEVALBUTEROL HCL 0.63 MG/3ML IN NEBU
0.6300 mg | INHALATION_SOLUTION | Freq: Four times a day (QID) | RESPIRATORY_TRACT | Status: DC
  Administered 2023-06-03: 0.63 mg via RESPIRATORY_TRACT
  Filled 2023-06-03: qty 3

## 2023-06-03 MED ORDER — LORAZEPAM 2 MG/ML IJ SOLN
1.0000 mg | Freq: Four times a day (QID) | INTRAMUSCULAR | Status: DC | PRN
  Administered 2023-06-04 (×2): 1 mg via INTRAVENOUS
  Filled 2023-06-03 (×2): qty 1

## 2023-06-03 MED ORDER — ONDANSETRON HCL 4 MG/2ML IJ SOLN: 4.0000 mg | Freq: Four times a day (QID) | INTRAMUSCULAR | Status: DC | PRN

## 2023-06-03 MED ORDER — ONDANSETRON HCL 4 MG PO TABS: 4.0000 mg | ORAL_TABLET | Freq: Four times a day (QID) | ORAL | Status: DC | PRN

## 2023-06-03 MED ORDER — ENOXAPARIN SODIUM 40 MG/0.4ML IJ SOSY
40.0000 mg | PREFILLED_SYRINGE | INTRAMUSCULAR | Status: DC
  Administered 2023-06-03 – 2023-06-04 (×2): 40 mg via SUBCUTANEOUS
  Filled 2023-06-03 (×2): qty 0.4

## 2023-06-03 MED ORDER — GUAIFENESIN ER 600 MG PO TB12
600.0000 mg | ORAL_TABLET | Freq: Two times a day (BID) | ORAL | Status: DC
  Administered 2023-06-04 – 2023-06-12 (×15): 600 mg via ORAL
  Filled 2023-06-03 (×16): qty 1

## 2023-06-03 MED ORDER — ACETAMINOPHEN 650 MG RE SUPP: 650.0000 mg | Freq: Four times a day (QID) | RECTAL | Status: DC | PRN

## 2023-06-03 NOTE — Procedures (Addendum)
Insertion of Chest Tube Procedure Note  Brent Bryant  [id number]  January 15, 1991  Date:06/03/23  Time:4:26 PM    Provider Performing: Janeann Forehand NP  Procedure: Pleural Catheter Insertion w/ Imaging Guidance (40981)  Indication(s) Effusion  Consent Risks of the procedure as well as the alternatives and risks of each were explained to the patient and/or caregiver.  Consent for the procedure was obtained and is signed in the bedside chart  Anesthesia Ativan and dilaudid administered under supervision of Dr. Katrinka Blazing   Time Out Verified patient identification, verified procedure, site/side was marked, verified correct patient position, special equipment/implants available, medications/allergies/relevant history reviewed, required imaging and test results available.   Sterile Technique Maximal sterile technique including full sterile barrier drape, hand hygiene, sterile gown, sterile gloves, mask, hair covering, sterile ultrasound probe cover (if used).   Procedure Description Ultrasound used to identify appropriate pleural anatomy for placement and overlying skin marked. Area of placement cleaned and draped in sterile fashion.  A 14 French pigtail pleural catheter was placed into the left pleural space using Seldinger technique. Appropriate return of fluid was obtained.  The tube was connected to atrium and placed on -20 cm H2O wall suction.   Complications/Tolerance None; patient tolerated the procedure well. Chest X-ray is ordered to verify placement.   EBL Minimal  Specimen(s) fluid

## 2023-06-03 NOTE — Significant Event (Signed)
Rapid Response Note Assisted with left pigtail insertion. Meds given per order, see MAR. On 20 suction. Consent placed in chart. Sample taken down to lab. CXR placed by MD. Dressing CDI. VSS pre/post procedure.   Truddie Crumble, RN

## 2023-06-03 NOTE — Progress Notes (Signed)
Pharmacy Antibiotic Note  Brent Bryant is a 33 y.o. male admitted on 06/02/2023 with pneumonia.  Pharmacy has been consulted for vancomycin and unasyn dosing; MD dosing azithromycin  Plan: Unasyn 3g IV q6h Vancomycin 1g IV q12h for estimated AUC 494 using SCr 0.92, Vd 0.72 Check vancomycin levels at steady state, goal AUC 400-550 Follow up renal function, cultures as available, clinical progress, length of tx    Temp (24hrs), Avg:98.5 F (36.9 C), Min:98 F (36.7 C), Max:99.1 F (37.3 C)  Recent Labs  Lab 06/02/23 1904  WBC 35.9*  CREATININE 0.92    CrCl cannot be calculated (Unknown ideal weight.).    Allergies  Allergen Reactions   Tylenol [Acetaminophen] Rash   Vicodin [Hydrocodone-Acetaminophen] Rash    Antimicrobials this admission: Ceftriaxone x 1 1/31 Azithromycin 2/1 >> 2/3 Vancomycin 2/1 >> Unasyn 2/1 >>  Dose adjustments this admission:  Microbiology results: 1/31 BCx: 1/31 Sputum:  Thank you for allowing pharmacy to be a part of this patient's care.  Loralee Pacas, PharmD, BCPS 06/03/2023 3:37 PM  Please check AMION for all [facility] Pharmacy phone numbers After 10:00 PM, call Main Pharmacy [phone]

## 2023-06-03 NOTE — Consult Note (Signed)
NAMECyris Bryant, MRN:  [id number], DOB:  [birth year], LOS: 0 ADMISSION DATE:  06/02/2023, CONSULTATION DATE:  06/03/23 REFERRING MD:  Jerral Ralph, CHIEF COMPLAINT:  flank pain   History of Present Illness:  33 year old man with hx of HepC, substance abuse presenting with 3 days of cough and chest pain.   Cough with brown sputum.  Chest pain is pleuritic.  Workup has revealed complex L pleural space and dense pneumonia.  PCCM consulted for assistance with management.  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     Pertinent  Medical History  HepC   Significant Hospital Events: Including procedures, antibiotic start and stop dates in addition to other pertinent events   2/1 admit  Interim History / Subjective:  Admit  Objective   Blood pressure 116/80, pulse 97, temperature 98.8 F (37.1 C), temperature source Oral, resp. rate (!) 24, SpO2 95%.        Intake/Output Summary (Last 24 hours) at 06/03/2023 1615 Last data filed at 06/03/2023 0830 Gross per 24 hour  Intake --  Output 1050 ml  Net -1050 ml   There were no vitals filed for this visit.  Examination: General: chronically ill appearing HENT: very poor dentition, trachea midline Lungs: diminished on left with complex space on [place] Cardiovascular: tachy, ext warm Abdomen: soft, hypoactive BS Extremities: no edema Neuro: moves to command Skin: no rashes  Resolved Hospital Problem list   N/A  Assessment & Plan:  Empyema Active substance abuse Severe sepsis  - Pigtail, send for usual studies - May need lytics - Tailor abx to sputum/pleural results - Check echo r/o IE - Will need long course of abx given CT appearance - Multimodal pain control  Best Practice (right click and "Reselect all SmartList Selections" daily)  Per primary  Labs   CBC: Recent Labs  Lab 06/02/23 1904 06/03/23 0223  WBC 35.9*  --   NEUTROABS 31.6*  --   HGB 13.3 12.2*  HCT 38.7* 36.0*  MCV 88.4  --   PLT 348  --     Basic  Metabolic Panel: Recent Labs  Lab 06/02/23 1904 06/03/23 0223  NA 133* 134*  K 3.2* 3.9  CL 98  --   CO2 22  --   GLUCOSE 107*  --   BUN 37*  --   CREATININE 0.92  --   CALCIUM 8.6*  --   MG 2.6*  --    GFR: CrCl cannot be calculated (Unknown ideal weight.). Recent Labs  Lab 06/02/23 1904  WBC 35.9*    Liver Function Tests: Recent Labs  Lab 06/02/23 1904  AST 18  ALT 13  ALKPHOS 260*  BILITOT 0.7  PROT 7.0  ALBUMIN 2.8*   No results for input(s): "LIPASE", "AMYLASE" in the last 168 hours. No results for input(s): "AMMONIA" in the last 168 hours.  ABG    Component Value Date/Time   PHART 7.379 06/03/2023 0223   PCO2ART 33.0 06/03/2023 0223   PO2ART 66 (L) 06/03/2023 0223   HCO3 19.5 (L) 06/03/2023 0223   TCO2 20 (L) 06/03/2023 0223   ACIDBASEDEF 5.0 (H) 06/03/2023 0223   O2SAT 93 06/03/2023 0223     Coagulation Profile: No results for input(s): "INR", "PROTIME" in the last 168 hours.  Cardiac Enzymes: No results for input(s): "CKTOTAL", "CKMB", "CKMBINDEX", "TROPONINI" in the last 168 hours.  HbA1C: No results found for: "HGBA1C"  CBG: No results for input(s): "GLUCAP" in the last 168 hours.  Review  of Systems:   He tells me to "look at his chart" during ROS  Past Medical History:  He,  has a past medical history of Allergy and Hepatitis C.   Surgical History:  History reviewed. No pertinent surgical history.   Social History:   reports that he has been smoking cigarettes. He has a 6 pack-year smoking history. He has never used smokeless tobacco. He reports current drug use. Drug: Marijuana. He reports that he does not drink alcohol.   Family History:  His family history includes Diabetes in his mother; Healthy in his father. There is no history of Stroke or Cancer.   Allergies Allergies  Allergen Reactions   Tylenol [Acetaminophen] Rash   Vicodin [Hydrocodone-Acetaminophen] Rash     Home Medications  Prior to Admission medications    Medication Sig Start Date End Date Taking? Authorizing Provider  acetaminophen (TYLENOL) 500 MG tablet Take 1,000 mg by mouth every 6 (six) hours as needed for mild pain.    [provider]  albuterol (VENTOLIN HFA) 108 (90 Base) MCG/ACT inhaler Inhale 2 puffs into the lungs every 4 (four) hours as needed for wheezing or shortness of breath. 04/10/20 05/10/20  Claude Manges, PA-C  ciprofloxacin (CIPRO) 500 MG tablet Take 1 tablet (500 mg total) by mouth 2 (two) times daily. 09/04/20   Linwood Dibbles, MD  Multiple Vitamin (MULTIVITAMIN WITH MINERALS) TABS tablet Take 1 tablet by mouth daily.    [provider]  naproxen (NAPROSYN) 500 MG tablet Take 1 tablet (500 mg total) by mouth 2 (two) times daily with a meal. As needed for pain 09/04/20   Linwood Dibbles, MD     Critical care time: N/A

## 2023-06-03 NOTE — Plan of Care (Addendum)
Plan of Care Note for accepted transfer  Patient: Brent Bryant              ZOX:[id number]  DOA: 06/02/2023     Facility requesting transfer: [facility] emergency department Requesting Provider: Dr. Nicanor Alcon  Reason for transfer: Bilateral lobe pneumonia and left-sided pleural effusion.  Facility course:  Patient is a 33year-old male with past medical history of hepatitis C and chronic transaminitis presenting for flank pain. Patient admits to severe left-sided flank pain and/or left-sided back pain that has been worsening over the past 4 days. He admits to flulike symptoms including body aches, nasal congestion, poor appetite, nausea without vomiting, and diarrhea x 12 days. Denies abdominal pain. Hematuria, increased frequency or urgency. Denies black or bloody stools   At presentation to ED patient O2 sat dropped to 89% currently 100%, 2L. tachycardic heart rate 110, tachypneic 24 and borderline hypotensive.  Respiratory panel negative for COVID, flu and RSV. CBC showing leukocytosis 35.9 otherwise unremarkable. CMP showing low sodium 133, low potassium 3.2, low albumin 2.8, elevated alkaline phosphatase 260.  CT abdomen pelvis no acute intra-abdominal or pelvic abnormality however it showed loculated left pleural effusion with left lower lobe consolidation concerning for postobstructive pneumonia.  Small area of right lobe consolidation anteriorly concerning for pneumonia.  In the ED patient has been given ceftriaxone and azithromycin as well as Toradol.  Hospitalist has been contacted for further evaluation management of pneumonia.Patient will need IR consult for left-sided pleural effusion drainage.  Plan of care: The patient is accepted for admission for inpatient status to Telemetry unit, at [facility]  Check [email or web address] for on-call coverage.  [facility] will assume care on arrival to accepting facility. Until arrival, medical decision making responsibilities remain  with the EDP.  However, [facility] available 24/7 for questions and assistance.   Nursing staff please page [facility] Admits and Consults [phone]) as soon as the patient arrives to the hospital.    Author: Tereasa Coop, MD  06/03/2023  [facility]

## 2023-06-03 NOTE — H&P (Signed)
History and Physical    Patient: Brent Bryant ZOX:[id number] DOB: July 18, 1990 DOA: 06/02/2023 DOS: the patient was seen and examined on 06/03/2023 PCP: Department, [facility]  Patient coming from: Home  Chief Complaint:  Chief Complaint  Patient presents with   Flank Pain   HPI: Brent Bryant is a 33 y.o. male with medical history significant of polysubstance drug use (claims in remission x 6 years-however UDS positive for cocaine), HCV who presented to [facility] ED with complaints of left sided chest pain/flank pain x 4 days.  Per patient-approximately 4-5 days back-patient's gradually started developing worsening left-sided flank/left back/left chest pain.  Pain is mostly pleuritic in nature-8-9/10 in intensity-with no radiation of the pain.  There is no particular aggravating or relieving factors.  He also has been having subjective fever, myalgias, nausea and some intermittent diarrhea for the past 7-10 days.  He subsequently presented via EMS to [facility]-where upon further evaluation-he was found to have leukocytosis up to 35.9K along with left lower lung consolidation and associated infiltrate.  Patient was given Rocephin/Zithromax-subsequently transferred to [facility] service at [facility] for further evaluation and treatment.  + Subjective fever Intermittent headache + Shortness of breath intermittently + Chest pain-pleuritic + Nausea No vomiting + Diarrhea No abdominal pain No rash No dysuria/hematuria  Review of Systems: As mentioned in the history of present illness. All other systems reviewed and are negative. Past Medical History:  Diagnosis Date   Allergy    Hepatitis C    History reviewed. No pertinent surgical history. Social History:  reports that he has been smoking cigarettes. He has a 6 pack-year smoking history. He has never used smokeless tobacco. He reports current drug use. Drug: Marijuana. He reports that he does not drink alcohol.  Allergies  Allergen  Reactions   Tylenol [Acetaminophen] Rash   Vicodin [Hydrocodone-Acetaminophen] Rash    Family History  Problem Relation Age of Onset   Diabetes Mother    Healthy Father    Stroke Neg Hx    Cancer Neg Hx     Prior to Admission medications   Medication Sig Start Date End Date Taking? Authorizing Provider  acetaminophen (TYLENOL) 500 MG tablet Take 1,000 mg by mouth every 6 (six) hours as needed for mild pain.    [provider]  albuterol (VENTOLIN HFA) 108 (90 Base) MCG/ACT inhaler Inhale 2 puffs into the lungs every 4 (four) hours as needed for wheezing or shortness of breath. 04/10/20 05/10/20  Claude Manges, PA-C  ciprofloxacin (CIPRO) 500 MG tablet Take 1 tablet (500 mg total) by mouth 2 (two) times daily. 09/04/20   Linwood Dibbles, MD  Multiple Vitamin (MULTIVITAMIN WITH MINERALS) TABS tablet Take 1 tablet by mouth daily.    [provider]  naproxen (NAPROSYN) 500 MG tablet Take 1 tablet (500 mg total) by mouth 2 (two) times daily with a meal. As needed for pain 09/04/20   Linwood Dibbles, MD    Physical Exam: Vitals:   06/03/23 1124 06/03/23 1200 06/03/23 1330 06/03/23 1514  BP:  104/69 113/67 116/80  Pulse:  98 97 97  Resp:  (!) 25 (!) 29 (!) 24  Temp: 98.6 F (37 C) 98.3 F (36.8 C)  98.8 F (37.1 C)  TempSrc: Oral Oral  Oral  SpO2:  95% 93% 95%   Gen Exam:Alert awake-not in any distress HEENT:atraumatic, normocephalic Chest: B/L clear to auscultation anteriorly CVS:S1S2 regular Abdomen:soft non tender, non distended Extremities:no edema Neurology: Non focal Skin: no rash  Data Reviewed:  Latest Ref Rng & Units 06/03/2023    2:23 AM 06/02/2023    7:04 PM 09/04/2020    9:40 AM  CBC  WBC 4.0 - 10.5 K/uL  35.9  5.4   Hemoglobin 13.0 - 17.0 g/dL 16.1  09.6  04.5   Hematocrit 39.0 - 52.0 % 36.0  38.7  43.0   Platelets 150 - 400 K/uL  348  263        Latest Ref Rng & Units 06/03/2023    2:23 AM 06/02/2023    7:04 PM 09/04/2020    9:40 AM  CMP  Glucose 70 - 99  mg/dL  409  81   BUN 6 - 20 mg/dL  37  17   Creatinine 8.11 - 1.24 mg/dL  9.14  7.82   Sodium 956 - 145 mmol/L 134  133  140   Potassium 3.5 - 5.1 mmol/L 3.9  3.2  4.3   Chloride 98 - 111 mmol/L  98  108   CO2 22 - 32 mmol/L  22  25   Calcium 8.9 - 10.3 mg/dL  8.6  9.8   Total Protein 6.5 - 8.1 g/dL  7.0    Total Bilirubin 0.0 - 1.2 mg/dL  0.7    Alkaline Phos 38 - 126 U/L  260    AST 15 - 41 U/L  18    ALT 0 - 44 U/L  13       Assessment and Plan: Sepsis 2/2 Left lobar pneumonia with probable empyema Will switch to vancomycin/Unasyn PCCM consulted for thoracocentesis/chest tube placement Pleural fluid studies ordered-will need to tailor antibiotics based on sensitivities accordingly.  Hypokalemia Repleted  History of HCV Patient very vague about whether he got treated-claims he took "pills"-but suspect he has not been treated. Obtain viral load  Polysubstance abuse UDS positive for cocaine, cannabinoid, opiates-however claims that he is in remission for the past 6 years-he claims that once he finds his phone-he can show [place] a video that would explain his UDS findings. Watch for withdrawal symptoms.   Advance Care Planning:   Code Status: Full Code   Consults: PCCM  Family Communication: None at bedside  Severity of Illness: The appropriate patient status for this patient is INPATIENT. Inpatient status is judged to be reasonable and necessary in order to provide the required intensity of service to ensure the patient's safety. The patient's presenting symptoms, physical exam findings, and initial radiographic and laboratory data in the context of their chronic comorbidities is felt to place them at high risk for further clinical deterioration. Furthermore, it is not anticipated that the patient will be medically stable for discharge from the hospital within 2 midnights of admission.   * I certify that at the point of admission it is my clinical judgment that the patient will  require inpatient hospital care spanning beyond 2 midnights from the point of admission due to high intensity of service, high risk for further deterioration and high frequency of surveillance required.*  Author: Jeoffrey Massed, MD 06/03/2023 3:28 PM  For on call review [email or web address].

## 2023-06-03 NOTE — Progress Notes (Signed)
Patient is very agitated and threatening that he is going to pull the chest tube out.  Per chart review patient's UDS positive with cocaine, opiates and THC.  Definitely he is withdrawing.  Has been admitted for sepsis secondary to left lower lobe empyema and chest tube has been placed by PCCM earlier today.  Currently patient is very agitated and tachycardic.  Giving Ativan 1 mg and Benadryl 25 mg to calm patient down however given the nature of his patient's illness and persistent agitation high risk of deterioration and in that case I am requesting to transfer him to progressive unit.  Tereasa Coop, MD [facility] 06/03/2023, 7:55 PM

## 2023-06-03 NOTE — ED Notes (Signed)
 Called [facility] for transport, pt bed assignment is ready

## 2023-06-03 NOTE — ED Notes (Signed)
I have just given report to Myrene Buddy, RN at [facility]. Report also given to [facility].

## 2023-06-04 ENCOUNTER — Inpatient Hospital Stay (HOSPITAL_COMMUNITY): Payer: MEDICAID

## 2023-06-04 DIAGNOSIS — A419 Sepsis, unspecified organism: Secondary | ICD-10-CM

## 2023-06-04 DIAGNOSIS — F1910 Other psychoactive substance abuse, uncomplicated: Secondary | ICD-10-CM

## 2023-06-04 DIAGNOSIS — R6520 Severe sepsis without septic shock: Secondary | ICD-10-CM

## 2023-06-04 DIAGNOSIS — I38 Endocarditis, valve unspecified: Secondary | ICD-10-CM

## 2023-06-04 LAB — COMPREHENSIVE METABOLIC PANEL
ALT: 10 U/L (ref 0–44)
AST: 15 U/L (ref 15–41)
Albumin: <1.5 g/dL — ABNORMAL LOW (ref 3.5–5.0)
Alkaline Phosphatase: 128 U/L — ABNORMAL HIGH (ref 38–126)
Anion gap: 9 (ref 5–15)
BUN: 17 mg/dL (ref 6–20)
CO2: 18 mmol/L — ABNORMAL LOW (ref 22–32)
Calcium: 7.6 mg/dL — ABNORMAL LOW (ref 8.9–10.3)
Chloride: 104 mmol/L (ref 98–111)
Creatinine, Ser: 0.77 mg/dL (ref 0.61–1.24)
GFR, Estimated: >60 mL/min (ref >60)
Glucose, Bld: 147 mg/dL — ABNORMAL HIGH (ref 70–99)
Potassium: 3.6 mmol/L (ref 3.5–5.1)
Sodium: 131 mmol/L — ABNORMAL LOW (ref 135–145)
Total Bilirubin: 1.1 mg/dL (ref 0.0–1.2)
Total Protein: 5.1 g/dL — ABNORMAL LOW (ref 6.5–8.1)

## 2023-06-04 LAB — CBC
HCT: 29.5 % — ABNORMAL LOW (ref 39.0–52.0)
Hemoglobin: 10.6 g/dL — ABNORMAL LOW (ref 13.0–17.0)
MCH: 31.5 pg (ref 26.0–34.0)
MCHC: 35.9 g/dL (ref 30.0–36.0)
MCV: 87.5 fL (ref 80.0–100.0)
Platelets: 385 10*3/uL (ref 150–400)
RBC: 3.37 MIL/uL — ABNORMAL LOW (ref 4.22–5.81)
RDW: 14 % (ref 11.5–15.5)
WBC: 23.9 10*3/uL — ABNORMAL HIGH (ref 4.0–10.5)
nRBC: 0 % (ref 0.0–0.2)

## 2023-06-04 LAB — ECHOCARDIOGRAM COMPLETE
AR max vel: 2.08 cm2
AV Area VTI: 2.26 cm2
AV Area mean vel: 2.31 cm2
AV Mean grad: 5 mm[Hg]
AV Peak grad: 10.8 mm[Hg]
Ao pk vel: 1.64 m/s
Area-P 1/2: 5.46 cm2
Calc EF: 68.5 %
Height: 68 in
MV VTI: 2.99 cm2
S' Lateral: 3.6 cm
Single Plane A2C EF: 75 %
Single Plane A4C EF: 60 %
Weight: 2081.14 [oz_av]

## 2023-06-04 LAB — MRSA NEXT GEN BY PCR, NASAL: MRSA by PCR Next Gen: NOT DETECTED

## 2023-06-04 MED ORDER — LEVALBUTEROL HCL 0.63 MG/3ML IN NEBU: 0.6300 mg | INHALATION_SOLUTION | Freq: Four times a day (QID) | RESPIRATORY_TRACT | Status: DC | PRN

## 2023-06-04 MED ORDER — SODIUM CHLORIDE (PF) 0.9 % IJ SOLN
10.0000 mg | Freq: Once | INTRAMUSCULAR | Status: AC
  Administered 2023-06-04: 10 mg via INTRAPLEURAL
  Filled 2023-06-04: qty 10

## 2023-06-04 MED ORDER — STERILE WATER FOR INJECTION IJ SOLN
5.0000 mg | Freq: Once | RESPIRATORY_TRACT | Status: AC
  Administered 2023-06-04: 5 mg via INTRAPLEURAL
  Filled 2023-06-04: qty 5

## 2023-06-04 MED ORDER — MORPHINE SULFATE (PF) 2 MG/ML IV SOLN
2.0000 mg | INTRAVENOUS | Status: DC | PRN
  Administered 2023-06-04: 2 mg via INTRAVENOUS
  Filled 2023-06-04: qty 1

## 2023-06-04 MED ORDER — MORPHINE SULFATE (PF) 2 MG/ML IV SOLN
2.0000 mg | INTRAVENOUS | Status: DC | PRN
  Administered 2023-06-04 – 2023-06-05 (×5): 4 mg via INTRAVENOUS
  Filled 2023-06-04 (×5): qty 2

## 2023-06-04 MED ORDER — LORAZEPAM 2 MG/ML IJ SOLN: 2.0000 mg | INTRAMUSCULAR | Status: DC | PRN

## 2023-06-04 MED ORDER — SODIUM CHLORIDE 0.9% FLUSH
10.0000 mL | Freq: Three times a day (TID) | INTRAVENOUS | Status: DC
  Administered 2023-06-04 – 2023-06-10 (×13): 10 mL via INTRAPLEURAL

## 2023-06-04 MED ORDER — LORAZEPAM 2 MG/ML IJ SOLN
2.0000 mg | INTRAMUSCULAR | Status: DC | PRN
  Administered 2023-06-05 – 2023-06-06 (×4): 2 mg via INTRAVENOUS
  Filled 2023-06-04 (×5): qty 1

## 2023-06-04 NOTE — Progress Notes (Signed)
Attempted to call patient's sister, Lebaron Bautch @ [phone], twice.  Called to update her that we had transferred the patient to 2C09.  I left a VM advising of the above.  Bernie Covey RN

## 2023-06-04 NOTE — Progress Notes (Signed)
PROGRESS NOTE    Brent Bryant  UJW:[id number] DOB: 1990-05-22 DOA: 06/02/2023 PCP: Department, [facility]  Outpatient Specialists:     Brief Narrative:  Patient is a 33 year old male, with past medical history significant for polysubstance drug use (claims in remission x 6 years-however UDS positive for cocaine), HCV who [facility] with complaints of left sided chest pain/flank pain x 4 days.  Patient has been admitted for sepsis secondary to left lower lobe pneumonia with probable empyema, polysubstance abuse, continuous and likely withdrawal symptoms.  Toxicology [place] positive for opiates, cocaine and tetrahydrocannabinol.  Alcohol level done on 06/03/2023 was less than 10.  Pulmonary/critical care input is highly appreciated.  Patient has had pigtail catheter placed.  Transthoracic echocardiogram done is nonrevealing.  Cultures are still pending.  Patient is on IV Unasyn, azithromycin and vancomycin.  Patient remains critically ill.  06/04/2023: Patient seen alongside patient's son and nurse.  Patient is unable to provide any history.  Apparently, patient does get some Ativan for withdrawals.  Heart rate is at a rate of 148 bpm.  As documented above, patient continues to look critically ill.  Patient's father was updated extensively.  Assessment & Plan:   Principal Problem:   CAP (community acquired pneumonia)   Sepsis 2/2 Left lobar pneumonia with empyema: -Cultures are still pending. -Patient is on IV vancomycin, Unasyn and azithromycin. -Input from the pulmonary and critical care team is highly appreciated.  Patient has undergone thoracentesis/chest tube placement.  Pleural fluid analysis suggestive of empyema.   -Transthoracic echocardiogram is nonrevealing. -Depending on the results of the blood cultures, patient may need TEE. -Guarded prognosis.   -Have a low threshold to escalate patient's care to ICU.   -A.m. labs..   Hypokalemia -Last potassium  level was 3.6.-Continue to monitor and replete.   History of HCV As per prior documentation: "Patient very vague about whether he got treated-claims he took "pills"-but suspect he has not been treated". -Follow viral load   Polysubstance abuse -UDS positive for cocaine, cannabinoid, opiates-however claims that he is in remission for the past 6 years-he claims that once he finds his phone-he can show [place] a video that would explain his UDS findings. -Patient is likely withdrawing.   DVT prophylaxis: Subcutaneous Lovenox Code Status: Full code Family Communication: Father Disposition Plan: Patient remains critically ill.   Consultants:  Pulmonary and critical care team  Procedures:  Thoracentesis and pigtail placement (chest tube)  Antimicrobials:  IV vancomycin IV Unasyn Azithromycin   Subjective: No history from patient (patient is given benzodiazepine).   Objective: Vitals:   06/04/23 0400 06/04/23 0600 06/04/23 0732 06/04/23 0746  BP: 107/71 111/77  102/70  Pulse: (!) 105 (!) 106    Resp: (!) 25 (!) 22    Temp:  (!) 100.7 F (38.2 C)  99.1 F (37.3 C)  TempSrc:  Oral  Oral  SpO2: 99% 100% 100%   Weight:      Height:        Intake/Output Summary (Last 24 hours) at 06/04/2023 0907 Last data filed at 06/04/2023 0747 Gross per 24 hour  Intake 2453.49 ml  Output 800 ml  Net 1653.49 ml   Filed Weights   06/03/23 2243  Weight: 59 kg    Examination:  General exam: Sedated.  Acutely ill looking.  Tachycardic.    Respiratory system: Decreased air entry. Cardiovascular system: S1 & S2, tachycardic.   Gastrointestinal system: Abdomen is soft and nontender.   Central nervous system: Patient  is sedated.   Extremities: No leg edema.  Data Reviewed: I have personally reviewed following labs and imaging studies  CBC: Recent Labs  Lab 06/02/23 1904 06/03/23 0223 06/03/23 1603 06/04/23 0803  WBC 35.9*  --  31.2* 23.9*  NEUTROABS 31.6*  --  26.5*  --   HGB 13.3  12.2* 11.3* 10.6*  HCT 38.7* 36.0* 32.8* 29.5*  MCV 88.4  --  89.4 87.5  PLT 348  --  359 385   Basic Metabolic Panel: Recent Labs  Lab 06/02/23 1904 06/03/23 0223 06/03/23 1603 06/04/23 0803  NA 133* 134* 126* 131*  K 3.2* 3.9 3.9 3.6  CL 98  --  99 104  CO2 22  --  17* 18*  GLUCOSE 107*  --  79 147*  BUN 37*  --  18 17  CREATININE 0.92  --  0.67 0.77  CALCIUM 8.6*  --  7.6* 7.6*  MG 2.6*  --   --   --    GFR: Estimated Creatinine Clearance: 108.6 mL/min (by C-G formula based on SCr of 0.77 mg/dL). Liver Function Tests: Recent Labs  Lab 06/02/23 1904 06/03/23 1603 06/04/23 0803  AST 18 16 15   ALT 13 12 10   ALKPHOS 260* 163* 128*  BILITOT 0.7 1.1 1.1  PROT 7.0 5.5* 5.1*  ALBUMIN 2.8* <1.5* <1.5*   No results for input(s): "LIPASE", "AMYLASE" in the last 168 hours. No results for input(s): "AMMONIA" in the last 168 hours. Coagulation Profile: Recent Labs  Lab 06/03/23 1603  INR 1.2   Cardiac Enzymes: No results for input(s): "CKTOTAL", "CKMB", "CKMBINDEX", "TROPONINI" in the last 168 hours. BNP (last 3 results) No results for input(s): "PROBNP" in the last 8760 hours. HbA1C: No results for input(s): "HGBA1C" in the last 72 hours. CBG: No results for input(s): "GLUCAP" in the last 168 hours. Lipid Profile: No results for input(s): "CHOL", "HDL", "LDLCALC", "TRIG", "CHOLHDL", "LDLDIRECT" in the last 72 hours. Thyroid Function Tests: No results for input(s): "TSH", "T4TOTAL", "FREET4", "T3FREE", "THYROIDAB" in the last 72 hours. Anemia Panel: No results for input(s): "VITAMINB12", "FOLATE", "FERRITIN", "TIBC", "IRON", "RETICCTPCT" in the last 72 hours. Urine analysis:    Component Value Date/Time   COLORURINE YELLOW 06/02/2023 2216   APPEARANCEUR CLEAR 06/02/2023 2216   LABSPEC 1.021 06/02/2023 2216   PHURINE 6.0 06/02/2023 2216   GLUCOSEU NEGATIVE 06/02/2023 2216   HGBUR NEGATIVE 06/02/2023 2216   BILIRUBINUR NEGATIVE 06/02/2023 2216   KETONESUR  NEGATIVE 06/02/2023 2216   PROTEINUR TRACE (A) 06/02/2023 2216   NITRITE NEGATIVE 06/02/2023 2216   LEUKOCYTESUR NEGATIVE 06/02/2023 2216   Sepsis Labs: @LABRCNTIP (procalcitonin:4,lacticidven:4)  ) Recent Results (from the past 240 hours)  Resp panel by RT-PCR (RSV, Flu A&B, Covid) Anterior Nasal Swab     Status: None   Collection Time: 06/02/23  6:16 PM   Specimen: Anterior Nasal Swab  Result Value Ref Range Status   SARS Coronavirus 2 by RT PCR NEGATIVE NEGATIVE Final    Comment: (NOTE) SARS-CoV-2 target nucleic acids are NOT DETECTED.  The SARS-CoV-2 RNA is generally detectable in upper respiratory specimens during the acute phase of infection. The lowest concentration of SARS-CoV-2 viral copies this assay can detect is 138 copies/mL. A negative result does not preclude SARS-Cov-2 infection and should not be used as the sole basis for treatment or other patient management decisions. A negative result may occur with  improper specimen collection/handling, submission of specimen other than nasopharyngeal swab, presence of viral mutation(s) within the areas targeted by  this assay, and inadequate number of viral copies(<138 copies/mL). A negative result must be combined with clinical observations, patient history, and epidemiological information. The expected result is Negative.  Fact Sheet for Patients:  BloggerCourse.com  Fact Sheet for Healthcare Providers:  SeriousBroker.it  This test is no t yet approved or cleared by the [place] FDA and  has been authorized for detection and/or diagnosis of SARS-CoV-2 by FDA under an Emergency Use Authorization (EUA). This EUA will remain  in effect (meaning this test can be used) for the duration of the COVID-19 declaration under Section 564(b)(1) of the Act, 21 U.S.C.section 360bbb-3(b)(1), unless the authorization is terminated  or revoked sooner.       Influenza A by PCR  NEGATIVE NEGATIVE Final   Influenza B by PCR NEGATIVE NEGATIVE Final    Comment: (NOTE) The Xpert Xpress SARS-CoV-2/FLU/RSV plus assay is intended as an aid in the diagnosis of influenza from Nasopharyngeal swab specimens and should not be used as a sole basis for treatment. Nasal washings and aspirates are unacceptable for Xpert Xpress SARS-CoV-2/FLU/RSV testing.  Fact Sheet for Patients: BloggerCourse.com  Fact Sheet for Healthcare Providers: SeriousBroker.it  This test is not yet approved or cleared by the [place] FDA and has been authorized for detection and/or diagnosis of SARS-CoV-2 by FDA under an Emergency Use Authorization (EUA). This EUA will remain in effect (meaning this test can be used) for the duration of the COVID-19 declaration under Section 564(b)(1) of the Act, 21 U.S.C. section 360bbb-3(b)(1), unless the authorization is terminated or revoked.     Resp Syncytial Virus by PCR NEGATIVE NEGATIVE Final    Comment: (NOTE) Fact Sheet for Patients: BloggerCourse.com  Fact Sheet for Healthcare Providers: SeriousBroker.it  This test is not yet approved or cleared by the [place] FDA and has been authorized for detection and/or diagnosis of SARS-CoV-2 by FDA under an Emergency Use Authorization (EUA). This EUA will remain in effect (meaning this test can be used) for the duration of the COVID-19 declaration under Section 564(b)(1) of the Act, 21 U.S.C. section 360bbb-3(b)(1), unless the authorization is terminated or revoked.  Performed at [facility], [address] [place], [place] [address]   Culture, blood (Routine X 2) w Reflex to ID Panel     Status: None (Preliminary result)   Collection Time: 06/03/23  1:30 AM   Specimen: BLOOD LEFT FOREARM  Result Value Ref Range Status   Specimen Description   Final    BLOOD LEFT  FOREARM Performed at [facility], [address] [place], [place] [address]    Special Requests   Final    BOTTLES DRAWN AEROBIC AND ANAEROBIC Blood Culture results may not be optimal due to an inadequate volume of blood received in culture bottles Performed at [facility], [address] [place], [place] [address]    Culture   Final    NO GROWTH < 24 HOURS Performed at [facility], [address], [place], [place] [address]    Report Status PENDING  Incomplete  Culture, blood (Routine X 2) w Reflex to ID Panel     Status: None (Preliminary result)   Collection Time: 06/03/23  1:39 AM   Specimen: BLOOD LEFT HAND  Result Value Ref Range Status   Specimen Description   Final    BLOOD LEFT HAND Performed at [facility], [address] [place], [place] [address]    Special Requests   Final    BOTTLES  DRAWN AEROBIC AND ANAEROBIC Blood Culture adequate volume Performed at [facility], [address] [place], [place] [address]    Culture   Final    NO GROWTH < 24 HOURS Performed at [facility], [address], [place], [place] [address]    Report Status PENDING  Incomplete  Expectorated Sputum Assessment w Gram Stain, Rflx to Resp Cult     Status: None   Collection Time: 06/03/23  4:48 AM   Specimen: Sputum  Result Value Ref Range Status   Specimen Description   Final    SPUTUM Performed at [facility], [address], [place], [place] [address]    Special Requests   Final    Normal Performed at [facility], [address] [place], [place] [address]    Sputum evaluation   Final    THIS SPECIMEN IS ACCEPTABLE FOR SPUTUM CULTURE Performed at [facility], [address] [place], [place] [address]    Report Status 06/03/2023 FINAL  Final  Culture, Respiratory w Gram Stain     Status: None (Preliminary result)   Collection Time:  06/03/23  4:48 AM   Specimen: SPU  Result Value Ref Range Status   Specimen Description SPUTUM  Final   Special Requests Normal Reflexed from [id number]  Final   Gram Stain   Final    MODERATE WBC PRESENT,BOTH PMN AND MONONUCLEAR FEW GRAM POSITIVE COCCI IN PAIRS FEW GRAM NEGATIVE RODS RARE YEAST WITH PSEUDOHYPHAE Performed at [facility], [address], [place], [place] [address]    Culture PENDING  Incomplete   Report Status PENDING  Incomplete  Body fluid culture w Gram Stain     Status: None (Preliminary result)   Collection Time: 06/03/23  4:35 PM   Specimen: Pleural Fluid  Result Value Ref Range Status   Specimen Description FLUID PLEURAL  Final   Special Requests NONE  Final   Gram Stain   Final    FEW WBC PRESENT,BOTH PMN AND MONONUCLEAR NO ORGANISMS SEEN    Culture   Final    NO GROWTH < 12 HOURS Performed at [facility], [address], [place], [place] [address]    Report Status PENDING  Incomplete  MRSA Next Gen by PCR, Nasal     Status: None   Collection Time: 06/04/23 12:20 AM   Specimen: Nasal Mucosa; Nasal Swab  Result Value Ref Range Status   MRSA by PCR Next Gen NOT DETECTED NOT DETECTED Final    Comment: (NOTE) The GeneXpert MRSA Assay (FDA approved for NASAL specimens only), is one component of a comprehensive MRSA colonization surveillance program. It is not intended to diagnose MRSA infection nor to guide or monitor treatment for MRSA infections. Test performance is not FDA approved in patients less than 99 years old. Performed at [facility], [address] [place], [place] [address]          Radiology Studies: DG Chest Port 1 View Result Date: 06/03/2023 CLINICAL DATA:  Chest tube in place. EXAM: PORTABLE CHEST 1 VIEW COMPARISON:  Same day. FINDINGS: Stable cardiomediastinal silhouette. Right lung is clear. Interval placement of pleural drainage catheter in the left lung base. Left pleural effusion is significantly enlarged  compared to prior exam with left upper lobe atelectasis or infiltrate. IMPRESSION: Interval placement of pleural drainage catheter into the left lung base. Left pleural effusion is significantly enlarged compared to prior exam with left upper lobe  atelectasis or infiltrate. Electronically Signed   By: Lupita Raider M.D.   On: 06/03/2023 16:59   DG Chest 2 View Result Date: 06/03/2023 CLINICAL DATA:  Cough EXAM: CHEST - 2 VIEW COMPARISON:  04/10/2020 FINDINGS: Loculated left pleural effusion with left lower lobe consolidation. Mild vascular congestion of the right lung. Normal cardiomediastinal contours. IMPRESSION: Loculated left pleural effusion with left lower lobe consolidation, concerning for pneumonia. Electronically Signed   By: Deatra Robinson M.D.   On: 06/03/2023 02:20   CT ABDOMEN PELVIS W CONTRAST Result Date: 06/02/2023 CLINICAL DATA:  Left flank pain EXAM: CT ABDOMEN AND PELVIS WITH CONTRAST TECHNIQUE: Multidetector CT imaging of the abdomen and pelvis was performed using the standard protocol following bolus administration of intravenous contrast. RADIATION DOSE REDUCTION: This exam was performed according to the departmental dose-optimization program which includes automated exposure control, adjustment of the mA and/or kV according to patient size and/or use of iterative reconstruction technique. CONTRAST:  OMNIPAQUE IOHEXOL 300 MG/ML  SOLN COMPARISON:  09/25/2018 FINDINGS: Lower Chest: Loculated left pleural effusion with left lower lobe consolidation. Small area of right middle lobe consolidation anteriorly. Hepatobiliary: Normal hepatic contours. No intra- or extrahepatic biliary dilatation. The gallbladder is normal. Pancreas: Normal pancreas. No ductal dilatation or peripancreatic fluid collection. Spleen: Normal. Adrenals/Urinary Tract: The adrenal glands are normal. No hydronephrosis, nephroureterolithiasis or solid renal mass. The urinary bladder is normal for degree of distention  Stomach/Bowel: There is no hiatal hernia. Normal duodenal course and caliber. No small bowel dilatation or inflammation. No focal colonic abnormality. Normal appendix. Vascular/Lymphatic: Normal course and caliber of the major abdominal vessels. No abdominal or pelvic lymphadenopathy. Reproductive: Normal prostate size with symmetric seminal vesicles. Other: None. Musculoskeletal: No bony spinal canal stenosis or focal osseous abnormality. IMPRESSION: 1. Loculated left pleural effusion with left lower lobe consolidation, concerning for post-obstructive pneumonia. 2. Small area of right middle lobe consolidation anteriorly, also concerning for pneumonia. 3. No acute abnormality of the abdomen or pelvis. Electronically Signed   By: Deatra Robinson M.D.   On: 06/02/2023 23:53        Scheduled Meds:  enoxaparin (LOVENOX) injection  40 mg Subcutaneous Q24H   guaiFENesin  600 mg Oral BID   ketorolac  30 mg Intravenous Q6H   levalbuterol  0.63 mg Nebulization Q6H   methocarbamol (ROBAXIN) injection  1,000 mg Intravenous Q8H   Continuous Infusions:  sodium chloride 75 mL/hr at 06/03/23 1806   ampicillin-sulbactam (UNASYN) IV 3 g (06/04/23 0349)   azithromycin 500 mg (06/03/23 2207)   vancomycin 1,000 mg (06/04/23 0612)     LOS: 1 day    Time spent: 55 minutes    Berton Mount, MD  [facility] Pager #: [phone] 7PM-7AM contact night coverage as above

## 2023-06-04 NOTE — Progress Notes (Signed)
eLink Physician-Brief Progress Note Patient Name: Brent Bryant DOB: 29-Sep-1990 MRN: [id number]   Date of Service  06/04/2023  HPI/Events of Note  33 year old male with a history of polysubstance abuse that was cocaine/THC/opiate positive, HCV, who initially presented with sepsis secondary to left lower lobe pneumonia and empyema that is currently stepdown status.  Notified about patient's withdrawal.  Opiate withdrawal score has gone from 7->17.  eICU Interventions  Has as needed opiates available.  I would continue to use these.  Increase to lorazepam as needed from every 4 hours to every 2 hours.     Intervention Category Minor Interventions: Agitation / anxiety - evaluation and management  [name] 06/04/2023, 7:34 PM

## 2023-06-04 NOTE — Progress Notes (Signed)
Catheter unclamped at 1:30. Suction in place. Pt asleep.

## 2023-06-04 NOTE — Progress Notes (Addendum)
Pt spiked a fever, 102.7, warm to touch. HR 160's RR 50s. Agitated. CCMD notified. See new orders.   New sahara Pleur-evac replaced.

## 2023-06-04 NOTE — Progress Notes (Signed)
   NAMEHarriet Bryant, MRN:  [id number], DOB:  [birth year], LOS: 1 ADMISSION DATE:  06/02/2023, CONSULTATION DATE:  06/03/23 REFERRING MD:  Jerral Ralph, CHIEF COMPLAINT:  flank pain   History of Present Illness:  33 year old man with hx of HepC, substance abuse presenting with 3 days of cough and chest pain.   Cough with brown sputum.  Chest pain is pleuritic.  Workup has revealed complex L pleural space and dense pneumonia.  PCCM consulted for assistance with management.  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     Pertinent  Medical History  HepC  Significant Hospital Events: Including procedures, antibiotic start and stop dates in addition to other pertinent events   2/1 admit, small bored chest tube placed  2/2 First dose pleural lytics, out of chest tune since insertion  Interim History / Subjective:  Continues to complain of chest pain and cough   Objective   Blood pressure 123/79, pulse (!) 110, temperature 99.7 F (37.6 C), temperature source Oral, resp. rate (!) 30, height 5\' 8"  (1.727 m), weight 59 kg, SpO2 94%.        Intake/Output Summary (Last 24 hours) at 06/04/2023 1154 Last data filed at 06/04/2023 0747 Gross per 24 hour  Intake 2453.49 ml  Output 800 ml  Net 1653.49 ml   Filed Weights   06/03/23 2243  Weight: 59 kg    Examination: General: Acute ill appearing adult male lying in bed, in NAD HEENT: [place]/AT, MM pink/moist, PERRL,  Neuro: Alert and oriented x3, non-focal  CV: s1s2 regular rate and rhythm, no murmur, rubs, or gallops,  PULM:  Clear to auscultation, mild tachypnea, on 2L [place] GI: soft, bowel sounds active in all 4 quadrants, non-tender, non-distended, tolerating oral diet Extremities: warm/dry, no edema  Skin: no rashes or lesions  Resolved Hospital Problem list   N/A  Assessment & Plan:  Empyema Active substance abuse Severe sepsis P: First dose pleural lytics today  Routine chest tube care  Monitor chest tube output  Mobilize as  able  Continue Unasyn  Follow cultures  Multimodal pain control    Best Practice (right click and "Reselect all SmartList Selections" daily)  Per primary  Critical care time: N/A  Zerek Litsey D. Harris, NP-C [facility] Personal contact information can be found on Amion  If no contact or response made please call [phone] 06/04/2023, 11:57 AM

## 2023-06-04 NOTE — Procedures (Signed)
Pleural Fibrinolytic Administration Procedure Note  Brent Bryant  [id number]  [birth year]  Date:06/04/23  Time:12:04 PM   Provider Performing:Brent Bryant   Procedure: Pleural Fibrinolysis Initial day ([address])  Indication(s) Fibrinolysis of complicated pleural effusion  Consent Risks of the procedure as well as the alternatives and risks of each were explained to the patient and/or caregiver.  Consent for the procedure was obtained.   Anesthesia None   Time Out Verified patient identification, verified procedure, site/side was marked, verified correct patient position, special equipment/implants available, medications/allergies/relevant history reviewed, required imaging and test results available.   Sterile Technique Hand hygiene, gloves   Procedure Description Existing pleural catheter was cleaned and accessed in sterile manner.  10mg  of tPA in 30cc of saline and 5mg  of dornase in 30cc of sterile water were injected into pleural space using existing pleural catheter.  Catheter will be clamped for 1 hour and then placed back to suction.   Complications/Tolerance None; patient tolerated the procedure well.  EBL None   Specimen(s) None  [name] D. Harris, NP-C [facility] Personal contact information can be found on Amion  If no contact or response made please call [phone] 06/04/2023, 12:04 PM

## 2023-06-04 NOTE — Progress Notes (Signed)
   06/03/23 1936  Assess: MEWS Score  Temp 99.8 F (37.7 C)  BP 117/82  MAP (mmHg) 92  Pulse Rate (!) 113  Resp (!) 32  Level of Consciousness Alert  SpO2 95 %  O2 Device Nasal Cannula  Patient Activity (if Appropriate) Other (Comment) (Attempting to pull chest tube out, attempting to climb out of bed, wanting to leave the hospital.)  O2 Flow Rate (L/min) 5 L/min  Assess: MEWS Score  MEWS Temp 0  MEWS Systolic 0  MEWS Pulse 2  MEWS RR 2  MEWS LOC 0  MEWS Score 4  MEWS Score Color Red  Assess: if the MEWS score is Yellow or Red  Were vital signs accurate and taken at a resting state? No, vital signs rechecked (Unable because of agitation and worsening mentation)  Does the patient meet 2 or more of the SIRS criteria? Yes  Does the patient have a confirmed or suspected source of infection? Yes  MEWS guidelines implemented  Yes, red  Treat  MEWS Interventions Considered administering scheduled or prn medications/treatments as ordered  Take Vital Signs  Increase Vital Sign Frequency  Red: Q1hr x2, continue Q4hrs until patient remains green for 12hrs  Escalate  MEWS: Escalate Red: Discuss with charge nurse and notify provider. Consider notifying RRT. If remains red for 2 hours consider need for higher level of care  Notify: Charge Nurse/RN  Name of Charge Nurse/RN Notified Bernie Covey RN  Provider Notification  Provider Name/Title Dr. Janalyn Shy  Date Provider Notified 06/03/23  Time Provider Notified 1947  Method of Notification Page (Secure Chat to call RN back)  Notification Reason Change in status;Requested by patient/family;Other (Comment)  Provider response See new orders;At bedside  Date of Provider Response 06/03/23  Notify: Rapid Response  Name of Rapid Response RN Notified Marcellina Millin RN  Date Rapid Response Notified 06/03/23  Time Rapid Response Notified 1945  Assess: SIRS CRITERIA  SIRS Temperature  0  SIRS Respirations  1  SIRS Pulse 1  SIRS WBC 1  SIRS  Score Sum  3   Patient is a new admit during day shift with chest tube placement at the bedside while on the unit.  Mother, Father, and Sister are at the bedside.  At the beginning of the shift, the patient is verbally aggressive, yelling out, and pulling his continuous pulse ox probe off, pulling his oxygen off, yanking at his chest tube, attempting to get out of bed, and wanting to leave the hospital.  Family members are understandably upset at this.  He is complaining of left chest pain, 10/10.  The above VS place him in the Red MEWS and at severe risk of respiratory distress and worsening deterioration.  Upon listening to BS, diffuse rhonchi heard and significantly increased work of breathing appreciated.  1 mg IV Dilaudid given @ 1935 with minimal response in agitation.  RED MEWS protocol implemented.  Rapid Response updated on situation.  Dr. Janalyn Shy made aware.  Concerns expressed to MD about possible deterioration and need for closer monitoring in higher level of care unit.  Order received for IV Ativan which was given @ 2010.  Patient became very sedated but HR and RR remained elevated.  RR RN and MD came to bed to assess the patient.  Order received to transfer patient to Progressive Care Unit.  Patient transferred without incident.  Family updated and in agreement.  Bernie Covey RN

## 2023-06-04 NOTE — Plan of Care (Signed)

## 2023-06-04 NOTE — Progress Notes (Signed)
   06/03/23 2243  Assess: MEWS Score  Temp (!) 100.8 F (38.2 C)  BP 113/73  MAP (mmHg) 84  Pulse Rate (!) 112  ECG Heart Rate (!) 112  Resp (!) 30  Level of Consciousness Responds to Voice  SpO2 100 %  O2 Device Nasal Cannula  Patient Activity (if Appropriate) In bed  O2 Flow Rate (L/min) 4 L/min  Assess: MEWS Score  MEWS Temp 1  MEWS Systolic 0  MEWS Pulse 2  MEWS RR 2  MEWS LOC 1  MEWS Score 6  MEWS Score Color Red  Assess: if the MEWS score is Yellow or Red  Were vital signs accurate and taken at a resting state? Yes  Does the patient meet 2 or more of the SIRS criteria? Yes  Does the patient have a confirmed or suspected source of infection? Yes  MEWS guidelines implemented  Yes, red  Treat  MEWS Interventions Considered administering scheduled or prn medications/treatments as ordered  Take Vital Signs  Increase Vital Sign Frequency  Red: Q1hr x2, continue Q4hrs until patient remains green for 12hrs  Escalate  MEWS: Escalate Red: Discuss with charge nurse and notify provider. Consider notifying RRT. If remains red for 2 hours consider need for higher level of care  Notify: Charge Nurse/RN  Name of Charge Nurse/RN Notified [place], RN  Provider Notification  Provider Name/Title Dr. Janalyn Shy ([place])  Date Provider Notified 06/03/23  Time Provider Notified 2250  Method of Notification Page  Notification Reason Other (Comment) (red mews)  Provider response See new orders  Date of Provider Response 06/03/23  Time of Provider Response 2250  Assess: SIRS CRITERIA  SIRS Temperature  0  SIRS Respirations  1  SIRS Pulse 1  SIRS WBC 1  SIRS Score Sum  3

## 2023-06-05 ENCOUNTER — Inpatient Hospital Stay (HOSPITAL_COMMUNITY): Payer: MEDICAID

## 2023-06-05 DIAGNOSIS — D72829 Elevated white blood cell count, unspecified: Secondary | ICD-10-CM

## 2023-06-05 DIAGNOSIS — J869 Pyothorax without fistula: Secondary | ICD-10-CM

## 2023-06-05 DIAGNOSIS — F509 Eating disorder, unspecified: Secondary | ICD-10-CM

## 2023-06-05 LAB — RENAL FUNCTION PANEL
Albumin: <1.5 g/dL — ABNORMAL LOW (ref 3.5–5.0)
Anion gap: 7 (ref 5–15)
BUN: 15 mg/dL (ref 6–20)
CO2: 21 mmol/L — ABNORMAL LOW (ref 22–32)
Calcium: 7.3 mg/dL — ABNORMAL LOW (ref 8.9–10.3)
Chloride: 102 mmol/L (ref 98–111)
Creatinine, Ser: 0.58 mg/dL — ABNORMAL LOW (ref 0.61–1.24)
GFR, Estimated: >60 mL/min (ref >60)
Glucose, Bld: 97 mg/dL (ref 70–99)
Phosphorus: 5.5 mg/dL — ABNORMAL HIGH (ref 2.5–4.6)
Potassium: 4.3 mmol/L (ref 3.5–5.1)
Sodium: 130 mmol/L — ABNORMAL LOW (ref 135–145)

## 2023-06-05 LAB — CBC WITH DIFFERENTIAL/PLATELET
Abs Immature Granulocytes: 1.68 10*3/uL — ABNORMAL HIGH (ref 0.00–0.07)
Basophils Absolute: 0.1 10*3/uL (ref 0.0–0.1)
Basophils Relative: 0 %
Eosinophils Absolute: 0.1 10*3/uL (ref 0.0–0.5)
Eosinophils Relative: 0 %
HCT: 31.5 % — ABNORMAL LOW (ref 39.0–52.0)
Hemoglobin: 10.9 g/dL — ABNORMAL LOW (ref 13.0–17.0)
Immature Granulocytes: 5 %
Lymphocytes Relative: 7 %
Lymphs Abs: 2.4 10*3/uL (ref 0.7–4.0)
MCH: 30.7 pg (ref 26.0–34.0)
MCHC: 34.6 g/dL (ref 30.0–36.0)
MCV: 88.7 fL (ref 80.0–100.0)
Monocytes Absolute: 3.3 10*3/uL — ABNORMAL HIGH (ref 0.1–1.0)
Monocytes Relative: 10 %
Neutro Abs: 25 10*3/uL — ABNORMAL HIGH (ref 1.7–7.7)
Neutrophils Relative %: 78 %
Platelets: 411 10*3/uL — ABNORMAL HIGH (ref 150–400)
RBC: 3.55 MIL/uL — ABNORMAL LOW (ref 4.22–5.81)
RDW: 13.9 % (ref 11.5–15.5)
WBC: 32.5 10*3/uL — ABNORMAL HIGH (ref 4.0–10.5)
nRBC: 0 % (ref 0.0–0.2)

## 2023-06-05 LAB — HEMOGLOBIN AND HEMATOCRIT, BLOOD
HCT: 31.2 % — ABNORMAL LOW (ref 39.0–52.0)
Hemoglobin: 11.1 g/dL — ABNORMAL LOW (ref 13.0–17.0)

## 2023-06-05 LAB — MAGNESIUM: Magnesium: 1.8 mg/dL (ref 1.7–2.4)

## 2023-06-05 MED ORDER — MORPHINE SULFATE (PF) 2 MG/ML IV SOLN
2.0000 mg | Freq: Once | INTRAVENOUS | Status: AC
  Administered 2023-06-05: 2 mg via INTRAVENOUS
  Filled 2023-06-05: qty 1

## 2023-06-05 MED ORDER — SODIUM CHLORIDE (PF) 0.9 % IJ SOLN
10.0000 mg | Freq: Once | INTRAMUSCULAR | Status: AC
  Administered 2023-06-05: 10 mg via INTRAPLEURAL
  Filled 2023-06-05: qty 10

## 2023-06-05 MED ORDER — MORPHINE SULFATE (PF) 2 MG/ML IV SOLN
2.0000 mg | INTRAVENOUS | Status: DC | PRN
  Administered 2023-06-05: 4 mg via INTRAVENOUS
  Administered 2023-06-05: 2 mg via INTRAVENOUS
  Administered 2023-06-06 (×5): 4 mg via INTRAVENOUS
  Administered 2023-06-06: 2 mg via INTRAVENOUS
  Administered 2023-06-06 – 2023-06-07 (×5): 4 mg via INTRAVENOUS
  Administered 2023-06-07: 2 mg via INTRAVENOUS
  Administered 2023-06-07 – 2023-06-13 (×30): 4 mg via INTRAVENOUS
  Filled 2023-06-05 (×27): qty 2
  Filled 2023-06-05: qty 1
  Filled 2023-06-05 (×4): qty 2
  Filled 2023-06-05: qty 1
  Filled 2023-06-05 (×5): qty 2
  Filled 2023-06-05: qty 1
  Filled 2023-06-05 (×10): qty 2

## 2023-06-05 MED ORDER — LACTATED RINGERS IV BOLUS
1000.0000 mL | INTRAVENOUS | Status: AC
  Administered 2023-06-05: 1000 mL via INTRAVENOUS

## 2023-06-05 MED ORDER — METOPROLOL TARTRATE 5 MG/5ML IV SOLN
2.5000 mg | INTRAVENOUS | Status: AC | PRN
Start: 2023-06-05 — End: 2023-06-05
  Administered 2023-06-05 (×2): 2.5 mg via INTRAVENOUS
  Filled 2023-06-05 (×2): qty 5

## 2023-06-05 MED ORDER — MORPHINE SULFATE (PF) 2 MG/ML IV SOLN: 2.0000 mg | INTRAVENOUS | Status: DC | PRN

## 2023-06-05 MED ORDER — LIDOCAINE 5 % EX PTCH
1.0000 | MEDICATED_PATCH | CUTANEOUS | Status: DC
  Administered 2023-06-05 – 2023-06-06 (×2): 1 via TRANSDERMAL
  Filled 2023-06-05 (×2): qty 1

## 2023-06-05 MED ORDER — STERILE WATER FOR INJECTION IJ SOLN
5.0000 mg | Freq: Once | RESPIRATORY_TRACT | Status: AC
  Administered 2023-06-05: 5 mg via INTRAPLEURAL
  Filled 2023-06-05: qty 5

## 2023-06-05 MED ORDER — LACTATED RINGERS IV BOLUS: 1000.0000 mL | Freq: Once | INTRAVENOUS | Status: DC

## 2023-06-05 NOTE — Progress Notes (Signed)
   NAMEKyrian Bryant, MRN:  [id number], DOB:  11/11/1990, LOS: 2 ADMISSION DATE:  06/02/2023, CONSULTATION DATE:  06/03/23 REFERRING MD:  Jerral Ralph, CHIEF COMPLAINT:  flank pain   History of Present Illness:  33 year old man with hx of HepC, substance abuse presenting with 3 days of cough and chest pain.   Cough with brown sputum.  Chest pain is pleuritic.  Workup has revealed complex L pleural space and dense pneumonia.  PCCM consulted for assistance with management.  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     Pertinent  Medical History  HepC  Significant Hospital Events: Including procedures, antibiotic start and stop dates in addition to other pertinent events   2/1 admit, small bored chest tube placed  2/2 First dose pleural lytics, out of chest tune since insertion  Interim History / Subjective:  S/p intrapleural lytics 2/2 with 2.1 L out  Objective   Blood pressure 97/71, pulse 91, temperature 98.1 F (36.7 C), temperature source Oral, resp. rate 20, height 5\' 8"  (1.727 m), weight 59 kg, SpO2 96%.        Intake/Output Summary (Last 24 hours) at 06/05/2023 1416 Last data filed at 06/05/2023 1234 Gross per 24 hour  Intake 1737.93 ml  Output 3250 ml  Net -1512.07 ml   Filed Weights   06/03/23 2243  Weight: 59 kg   Physical Exam: General: Well-appearing, no acute distress HENT: [place], AT Eyes: EOMI, no scleral icterus Respiratory: Diminished left air entry, remaining clear to auscultation bilaterally.  No crackles, wheezing or rales. Left chest tube in place Cardiovascular: RRR, -M/R/G, no JVD Extremities:-Edema,-tenderness Neuro: AAO x4, CNII-XII grossly intact Psych: Normal mood, normal affect  CXR 2/3 Improved left pleural effusion aeration WBC 32 worsening Hg 10.9 stable Respiratory culture 2/1 Rare strep group F within normal respiratory flora Pleural fluid, left 2/1 - NGTD  Resolved Hospital Problem list   N/A  Assessment & Plan:  Empyema Active  substance abuse Severe sepsis P: Intrapleural lytics daily 2/2> Repeat lytics today Routine chest tube care and flushes Chest tube to suction -20 cm H20 Continue Unasyn Follow cultures  Multimodal pain control    Best Practice (right click and "Reselect all SmartList Selections" daily)  Per primary  Critical care time: N/A   Care Time: 50 min  Mechele Collin, M.D. [facility] Pulmonary/Critical Care Medicine 06/05/2023 2:16 PM   See Amion for personal pager For hours between 7 PM to 7 AM, please call Elink for urgent questions

## 2023-06-05 NOTE — Progress Notes (Signed)
PCCM Progress Note  Chest tube returned to suction. Patient began complaining of severe chest pain. Morphine 2 mg IV ordered. STAT CXR ordered

## 2023-06-05 NOTE — Consult Note (Addendum)
I have seen and examined the patient. I have personally reviewed the clinical findings, laboratory findings, microbiological data and imaging studies. The assessment and treatment plan was discussed with the Nurse Practitioner. I agree with her/his recommendations except following additions/corrections.  Exam - thinly built and cachectic. Left sided chest tube. On Nasal cannula. Poor dental hygiene and caries. Decreased air entry in left lung. CVS and abdomen wnl. Skin no rashes. Awake, alert and oriented. No pedal edema or peripheral joint swelling.   # Post obstructive pna/empyema - s/p pleural drainage catheter, cx NGTD - sputum cx with strep F with normal respiratory flora.  - MRSA PCR negative - Pulmonary following, on pleural lytics - continue Unasyn, no need to broaden coverage - Monitor CBC and CMP - I will order Orthopantogram  # Fevers/leukocytosis - will monitor but suspect will improve with pulmonary source control   # Substance use  # h/o HCV  - will check HBV and HCV serology - Substance abuse management per primary   I have personally spent 80 minutes involved in face-to-face and non-face-to-face activities for this patient on the day of the visit. Professional time spent includes the following activities: Preparing to see the patient (review of tests), Obtaining and/or reviewing separately obtained history (admission/discharge record), Performing a medically appropriate examination and/or evaluation , Ordering medications/tests/procedures, referring and communicating with other health care professionals, Documenting clinical information in the EMR, Independently interpreting results (not separately reported), Communicating results to the patient/family/caregiver, Counseling and educating the patient/family/caregiver and Care coordination (not separately reported).    [facility]    Date of Admission:  06/02/2023     Total days of antibiotics  4               Reason for Consult: Empyema   Referring Provider: Dr. Dartha Lodge Primary Care Provider: Department, [facility]   ASSESSMENT:  Brent Bryant is a 33 y/o male with recent history of flu-like symptoms admitted with left sided chest/flank pain and found to have an empyema now s/p pleural catheter insertion and use of fibrinolytics. Febrile in the past 24 hours and continues to have leukocytosis and suspect this is related to source control and need for continued drainage and antibiotics. Plan of care discussed with Mr. Mitchum and family at beside with recommendations for continued Unasyn. MRSA PCR negative and can discontinue vancomycin. Sputum culture growing Group F Streptococcus which will be covered by Unasyn. Continue to monitor fever curve and leukocytosis along with blood cultures for bacteremia. When improved may benefit from orthopantogram to ensure mouth is not the source of infection despite this being a post-secondary infection likely from influenza or similar. Check Hepatitis B and Hepatitis C lab work. If positive can treat as outpatient. Continue chest tube management per PCCM with remaining medical and supportive care per Internal Medicine.   PLAN:  Continue current dose of Unasyn.  Discontinue vancomycin.  Monitor blood cultures for bacteremia. Monitor fever curve and leukocytosis for improvements Hepatitis B an Hepatitis C lab work.  May need treatment for opioid use disorder. Pleural catheter/fibrinolytics per PCCM. Remaining medical and supportive care per Internal Medicine.    Principal Problem:   CAP (community acquired pneumonia) Active Problems:   Polysubstance abuse (HCC)   Severe sepsis (HCC)    enoxaparin (LOVENOX) injection  40 mg Subcutaneous Q24H   guaiFENesin  600 mg Oral BID   ketorolac  30 mg Intravenous Q6H   sodium chloride flush  10 mL Intrapleural  Q8H     HPI: Tavarious Bryant is a 33 y.o. male with previous medical history of  Hepatitis C, opoid use disorder, and polysubstance use presenting with left sided chest/flank pain.  Mr. Hack was in his usual state of health until January 17th when he developed flu-like symptoms which continued until about 4 days prior to arrival transitioning to left sided chest/flank pain. Initially afebrile with leukocytosis of 35.9. CT abdomen/pelivs with loculated left pleural effusion with left lower lobe consolidation concerning for post-obstructive pneumonia and no other abdomen or pelvis abnormalities. Blood cultures from 06/07/23 have been without growth to date. Sputum culture growing Streptococcus Group F. Respiratory panel negative for influenza/Covid-19/RSV. Initially started on broad spectrum coverage with vancomycin, azithromycin and ampicillin-sulbactam. S/p pleural catheter insertion on 06/03/23. Subsequently developed a fever in the past 24 hours of 101.8 F.  PCCM added pleural fibrinolytics. MRSA nasal PCR negative. ID has been asked to assist in antibiotic therapy.  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 Has a history of trauma that contributed to opioid use disorder. Previous history of Hepatitis C that he does not provide a clear answer when asked about treatment. Toxicology was positive for THC, cocaine and opiates. Feeling much better since being in the hospital. Has not received any routine dental care secondary to financial issues.    Review of Systems: Review of Systems  Constitutional:  Negative for chills, fever and weight loss.  Respiratory:  Positive for shortness of breath. Negative for cough and wheezing.   Cardiovascular:  Positive for chest pain. Negative for leg swelling.  Gastrointestinal:  Negative for abdominal pain, constipation, diarrhea, nausea and vomiting.  Skin:  Negative for rash.     Past Medical History:  Diagnosis Date   Allergy    Hepatitis C    History reviewed. No pertinent surgical  history.  Social History   Tobacco Use   Smoking status: Every Day    Current packs/day: 0.50    Average packs/day: 0.5 packs/day for 12.0 years (6.0 ttl pk-yrs)    Types: Cigarettes   Smokeless tobacco: Never  Vaping Use   Vaping status: Never Used  Substance Use Topics   Alcohol use: No   Drug use: Yes    Types: Marijuana    Family History  Problem Relation Age of Onset   Diabetes Mother    Healthy Father    Stroke Neg Hx    Cancer Neg Hx     Allergies  Allergen Reactions   Tylenol [Acetaminophen] Rash   Vicodin [Hydrocodone-Acetaminophen] Rash    OBJECTIVE: Blood pressure 97/71, pulse 91, temperature 98.1 F (36.7 C), temperature source Oral, resp. rate 20, height 5\' 8"  (1.727 m), weight 59 kg, SpO2 96%.  Physical Exam Constitutional:      General: He is not in acute distress.    Appearance: He is well-developed.  Cardiovascular:     Rate and Rhythm: Normal rate and regular rhythm.     Heart sounds: Normal heart sounds.  Pulmonary:     Effort: Pulmonary effort is normal. No tachypnea or respiratory distress.     Breath sounds: Decreased air movement present.  Skin:    General: Skin is warm and dry.  Neurological:     Mental Status: He is alert.  Psychiatric:        Mood and Affect: Mood normal.     Lab Results Lab Results  Component Value Date   WBC 32.5 (H)  06/05/2023   HGB 10.9 (L) 06/05/2023   HCT 31.5 (L) 06/05/2023   MCV 88.7 06/05/2023   PLT 411 (H) 06/05/2023    Lab Results  Component Value Date   CREATININE 0.58 (L) 06/05/2023   BUN 15 06/05/2023   NA 130 (L) 06/05/2023   K 4.3 06/05/2023   CL 102 06/05/2023   CO2 21 (L) 06/05/2023    Lab Results  Component Value Date   ALT 10 06/04/2023   AST 15 06/04/2023   ALKPHOS 128 (H) 06/04/2023   BILITOT 1.1 06/04/2023     Microbiology: Recent Results (from the past 240 hours)  Resp panel by RT-PCR (RSV, Flu A&B, Covid) Anterior Nasal Swab     Status: None   Collection Time:  06/02/23  6:16 PM   Specimen: Anterior Nasal Swab  Result Value Ref Range Status   SARS Coronavirus 2 by RT PCR NEGATIVE NEGATIVE Final    Comment: (NOTE) SARS-CoV-2 target nucleic acids are NOT DETECTED.  The SARS-CoV-2 RNA is generally detectable in upper respiratory specimens during the acute phase of infection. The lowest concentration of SARS-CoV-2 viral copies this assay can detect is 138 copies/mL. A negative result does not preclude SARS-Cov-2 infection and should not be used as the sole basis for treatment or other patient management decisions. A negative result may occur with  improper specimen collection/handling, submission of specimen other than nasopharyngeal swab, presence of viral mutation(s) within the areas targeted by this assay, and inadequate number of viral copies(<138 copies/mL). A negative result must be combined with clinical observations, patient history, and epidemiological information. The expected result is Negative.  Fact Sheet for Patients:  BloggerCourse.com  Fact Sheet for Healthcare Providers:  SeriousBroker.it  This test is no t yet approved or cleared by the [place] FDA and  has been authorized for detection and/or diagnosis of SARS-CoV-2 by FDA under an Emergency Use Authorization (EUA). This EUA will remain  in effect (meaning this test can be used) for the duration of the COVID-19 declaration under Section 564(b)(1) of the Act, 21 U.S.C.section 360bbb-3(b)(1), unless the authorization is terminated  or revoked sooner.       Influenza A by PCR NEGATIVE NEGATIVE Final   Influenza B by PCR NEGATIVE NEGATIVE Final    Comment: (NOTE) The Xpert Xpress SARS-CoV-2/FLU/RSV plus assay is intended as an aid in the diagnosis of influenza from Nasopharyngeal swab specimens and should not be used as a sole basis for treatment. Nasal washings and aspirates are unacceptable for Xpert Xpress  SARS-CoV-2/FLU/RSV testing.  Fact Sheet for Patients: BloggerCourse.com  Fact Sheet for Healthcare Providers: SeriousBroker.it  This test is not yet approved or cleared by the [place] FDA and has been authorized for detection and/or diagnosis of SARS-CoV-2 by FDA under an Emergency Use Authorization (EUA). This EUA will remain in effect (meaning this test can be used) for the duration of the COVID-19 declaration under Section 564(b)(1) of the Act, 21 U.S.C. section 360bbb-3(b)(1), unless the authorization is terminated or revoked.     Resp Syncytial Virus by PCR NEGATIVE NEGATIVE Final    Comment: (NOTE) Fact Sheet for Patients: BloggerCourse.com  Fact Sheet for Healthcare Providers: SeriousBroker.it  This test is not yet approved or cleared by the [place] FDA and has been authorized for detection and/or diagnosis of SARS-CoV-2 by FDA under an Emergency Use Authorization (EUA). This EUA will remain in effect (meaning this test can be used) for the duration of the COVID-19 declaration under Section 564(b)(1)  of the Act, 21 U.S.C. section 360bbb-3(b)(1), unless the authorization is terminated or revoked.  Performed at [facility], [address] [place], [place] [address]   Culture, blood (Routine X 2) w Reflex to ID Panel     Status: None (Preliminary result)   Collection Time: 06/03/23  1:30 AM   Specimen: BLOOD LEFT FOREARM  Result Value Ref Range Status   Specimen Description   Final    BLOOD LEFT FOREARM Performed at [facility], [address], [place], [place] [address]    Special Requests   Final    BOTTLES DRAWN AEROBIC AND ANAEROBIC Blood Culture results may not be optimal due to an inadequate volume of blood received in culture bottles Performed at [facility], [address]  [place], [place] [address]    Culture   Final    NO GROWTH 2 DAYS Performed at [facility], [address] [place], [place] [address]    Report Status PENDING  Incomplete  Culture, blood (Routine X 2) w Reflex to ID Panel     Status: None (Preliminary result)   Collection Time: 06/03/23  1:39 AM   Specimen: BLOOD LEFT HAND  Result Value Ref Range Status   Specimen Description   Final    BLOOD LEFT HAND Performed at [facility], [address] [place], [place] [address]    Special Requests   Final    BOTTLES DRAWN AEROBIC AND ANAEROBIC Blood Culture adequate volume Performed at [facility], [address] [place], [place] [address]    Culture   Final    NO GROWTH 2 DAYS Performed at [facility], [address], [place], [place] [address]    Report Status PENDING  Incomplete  Expectorated Sputum Assessment w Gram Stain, Rflx to Resp Cult     Status: None   Collection Time: 06/03/23  4:48 AM   Specimen: Sputum  Result Value Ref Range Status   Specimen Description   Final    SPUTUM Performed at [facility], [address] [place], [place] [address]    Special Requests   Final    Normal Performed at [facility], [address] [place], [place] [address]    Sputum evaluation   Final    THIS SPECIMEN IS ACCEPTABLE FOR SPUTUM CULTURE Performed at [facility], [address] [place], [place] [address]    Report Status 06/03/2023 FINAL  Final  Culture, Respiratory w Gram Stain     Status: None (Preliminary result)   Collection Time: 06/03/23  4:48 AM   Specimen: SPU  Result Value Ref Range Status   Specimen Description SPUTUM  Final   Special Requests Normal Reflexed from [id number]  Final   Gram Stain   Final    MODERATE WBC PRESENT,BOTH PMN AND MONONUCLEAR FEW GRAM POSITIVE COCCI IN PAIRS FEW GRAM NEGATIVE RODS RARE YEAST WITH PSEUDOHYPHAE    Culture   Final    RARE  STREPTOCOCCUS GROUP F Beta hemolytic streptococci are predictably susceptible to penicillin and other beta lactams. Susceptibility testing not routinely performed. WITHIN NORMAL RESPIRATORY FLORA Performed at [facility], [address], [place], [place] [address]    Report Status PENDING  Incomplete  Body fluid culture w Gram Stain     Status: None (Preliminary result)   Collection Time: 06/03/23  4:35 PM   Specimen: Pleural Fluid  Result Value Ref Range Status   Specimen  Description FLUID PLEURAL  Final   Special Requests NONE  Final   Gram Stain   Final    FEW WBC PRESENT,BOTH PMN AND MONONUCLEAR NO ORGANISMS SEEN    Culture   Final    NO GROWTH 2 DAYS Performed at [facility], [address], [place], [place] [address]    Report Status PENDING  Incomplete  MRSA Next Gen by PCR, Nasal     Status: None   Collection Time: 06/04/23 12:20 AM   Specimen: Nasal Mucosa; Nasal Swab  Result Value Ref Range Status   MRSA by PCR Next Gen NOT DETECTED NOT DETECTED Final    Comment: (NOTE) The GeneXpert MRSA Assay (FDA approved for NASAL specimens only), is one component of a comprehensive MRSA colonization surveillance program. It is not intended to diagnose MRSA infection nor to guide or monitor treatment for MRSA infections. Test performance is not FDA approved in patients less than 19 years old. Performed at [facility], [address], [place], [place] [address]    Imaging DG CHEST PORT 1 VIEW Result Date: 06/05/2023 CLINICAL DATA:  Chest tube in place. EXAM: PORTABLE CHEST 1 VIEW COMPARISON:  Radiographs 06/04/2023 and 06/03/2023. Abdominal CT 06/02/2023. FINDINGS: 0527 hours. Left pigtail chest tube is unchanged in position, overlying the medial left lung base. Lobulated left pleural effusion appears mildly decreased in volume, and there is improved aeration of the left lung. The right lung is clear. No evidence of pneumothorax. The visualized heart size and  mediastinal contours are stable. IMPRESSION: Mild decrease in volume of left pleural effusion with improved aeration of the left lung. No evidence of pneumothorax. Electronically Signed   By: Carey Bullocks M.D.   On: 06/05/2023 10:12   ECHOCARDIOGRAM COMPLETE Result Date: 06/04/2023    ECHOCARDIOGRAM REPORT   Patient Name:   Brent Bryant Date of Exam: 06/04/2023 Medical Rec #:  [id number]    Height:       68.0 in Accession #:    [id number]   Weight:       130.1 lb Date of Birth:  [birth year]    BSA:          1.702 m Patient Age:    32 years     BP:           119/74 mmHg Patient Gender: M            HR:           111 bpm. Exam Location:  Inpatient Procedure: 2D Echo, Cardiac Doppler and Color Doppler Indications:    Endocarditis  History:        Patient has no prior history of Echocardiogram examinations.  Sonographer:    Amy Chionchio Referring Phys: Lorin Glass IMPRESSIONS  1. Left ventricular ejection fraction, by estimation, is 65 to 70%. Left ventricular ejection fraction by 2D MOD biplane is 68.5 %. The left ventricle has normal function. The left ventricle has no regional wall motion abnormalities. Left ventricular diastolic parameters were normal.  2. Right ventricular systolic function is normal. The right ventricular size is normal. Tricuspid regurgitation signal is inadequate for assessing PA pressure.  3. The mitral valve is grossly normal. No evidence of mitral valve regurgitation.  4. The aortic valve was not well visualized. Aortic valve regurgitation is not visualized.  5. The inferior vena cava is normal in size with <50% respiratory variability, suggesting right atrial pressure of 8 mmHg. Comparison(s): No prior Echocardiogram. Conclusion(s)/Recommendation(s): No evidence  of valvular vegetations on this transthoracic echocardiogram. Consider a transesophageal echocardiogram to exclude infective endocarditis if clinically indicated. FINDINGS  Left Ventricle: Left ventricular ejection fraction, by  estimation, is 65 to 70%. Left ventricular ejection fraction by 2D MOD biplane is 68.5 %. The left ventricle has normal function. The left ventricle has no regional wall motion abnormalities. The left ventricular internal cavity size was normal in size. There is no left ventricular hypertrophy. Left ventricular diastolic parameters were normal. Right Ventricle: The right ventricular size is normal. No increase in right ventricular wall thickness. Right ventricular systolic function is normal. Tricuspid regurgitation signal is inadequate for assessing PA pressure. Left Atrium: Left atrial size was normal in size. Right Atrium: Right atrial size was normal in size. Pericardium: There is no evidence of pericardial effusion. Mitral Valve: The mitral valve is grossly normal. No evidence of mitral valve regurgitation. MV peak gradient, 2.2 mmHg. The mean mitral valve gradient is 1.0 mmHg. Tricuspid Valve: The tricuspid valve is normal in structure. Tricuspid valve regurgitation is not demonstrated. Aortic Valve: The aortic valve was not well visualized. Aortic valve regurgitation is not visualized. Aortic valve mean gradient measures 5.0 mmHg. Aortic valve peak gradient measures 10.8 mmHg. Aortic valve area, by VTI measures 2.26 cm. Pulmonic Valve: The pulmonic valve was normal in structure. Pulmonic valve regurgitation is not visualized. Aorta: The aortic root and ascending aorta are structurally normal, with no evidence of dilitation. Venous: The inferior vena cava is normal in size with less than 50% respiratory variability, suggesting right atrial pressure of 8 mmHg. IAS/Shunts: No atrial level shunt detected by color flow Doppler.  LEFT VENTRICLE PLAX 2D                        Biplane EF (MOD) LVIDd:         4.60 cm         LV Biplane EF:   Left LVIDs:         3.60 cm                          ventricular LV PW:         0.80 cm                          ejection LV IVS:        0.60 cm                          fraction  by LVOT diam:     2.10 cm                          2D MOD LV SV:         46                               biplane is LV SV Index:   27                               68.5 %. LVOT Area:     3.46 cm  Diastology                                LV e' medial:    11.20 cm/s LV Volumes (MOD)               LV E/e' medial:  7.7 LV vol d, MOD    101.0 ml      LV e' lateral:   17.00 cm/s A2C:                           LV E/e' lateral: 5.1 LV vol d, MOD    126.0 ml A4C: LV vol s, MOD    25.3 ml A2C: LV vol s, MOD    50.4 ml A4C: LV SV MOD A2C:   75.7 ml LV SV MOD A4C:   126.0 ml LV SV MOD BP:    87.9 ml RIGHT VENTRICLE          IVC RV Basal diam:  2.70 cm  IVC diam: 1.50 cm TAPSE (M-mode): 2.3 cm LEFT ATRIUM             Index        RIGHT ATRIUM           Index LA Vol (A2C):   50.3 ml 29.55 ml/m  RA Area:     14.30 cm LA Vol (A4C):   44.7 ml 26.26 ml/m  RA Volume:   36.70 ml  21.56 ml/m LA Biplane Vol: 47.9 ml 28.14 ml/m  AORTIC VALVE                     PULMONIC VALVE AV Area (Vmax):    2.08 cm      PV Vmax:       0.96 m/s AV Area (Vmean):   2.31 cm      PV Peak grad:  3.6 mmHg AV Area (VTI):     2.26 cm AV Vmax:           164.00 cm/s AV Vmean:          107.000 cm/s AV VTI:            0.204 m AV Peak Grad:      10.8 mmHg AV Mean Grad:      5.0 mmHg LVOT Vmax:         98.40 cm/s LVOT Vmean:        71.300 cm/s LVOT VTI:          0.133 m LVOT/AV VTI ratio: 0.65  AORTA Ao Root diam: 3.30 cm Ao Asc diam:  2.90 cm MITRAL VALVE MV Area (PHT): 5.46 cm    SHUNTS MV Area VTI:   2.99 cm    Systemic VTI:  0.13 m MV Peak grad:  2.2 mmHg    Systemic Diam: 2.10 cm MV Mean grad:  1.0 mmHg MV Vmax:       0.75 m/s MV Vmean:      59.1 cm/s MV Decel Time: 139 msec MV E velocity: 86.40 cm/s MV A velocity: 71.10 cm/s MV E/A ratio:  1.22 Zoila Shutter MD Electronically signed by Zoila Shutter MD Signature Date/Time: 06/04/2023/1:37:10 PM    Final    DG Chest Port 1 View Result Date: 06/04/2023 CLINICAL DATA:   Chest tube in place EXAM: PORTABLE CHEST 1 VIEW COMPARISON:  Yesterday FINDINGS: Numerous leads and wires project over  the chest. Minimal tracheal deviation to the right. Mild cardiomegaly. A left-sided pleural pigtail catheter is unchanged in position inferiorly. No significant change in moderate left pleural effusion with extensive loculation superiorly and laterally. Relatively diffuse left-sided airspace disease is also similar. The right lung remains clear. IMPRESSION: Relatively similar appearance of loculated left-sided pleural fluid and diffuse left-sided airspace disease. Left-sided pleural catheter remaining in place. Electronically Signed   By: Jeronimo Greaves M.D.   On: 06/04/2023 09:34   DG Chest Port 1 View Result Date: 06/03/2023 CLINICAL DATA:  Chest tube in place. EXAM: PORTABLE CHEST 1 VIEW COMPARISON:  Same day. FINDINGS: Stable cardiomediastinal silhouette. Right lung is clear. Interval placement of pleural drainage catheter in the left lung base. Left pleural effusion is significantly enlarged compared to prior exam with left upper lobe atelectasis or infiltrate. IMPRESSION: Interval placement of pleural drainage catheter into the left lung base. Left pleural effusion is significantly enlarged compared to prior exam with left upper lobe atelectasis or infiltrate. Electronically Signed   By: Lupita Raider M.D.   On: 06/03/2023 16:59   DG Chest 2 View Result Date: 06/03/2023 CLINICAL DATA:  Cough EXAM: CHEST - 2 VIEW COMPARISON:  04/10/2020 FINDINGS: Loculated left pleural effusion with left lower lobe consolidation. Mild vascular congestion of the right lung. Normal cardiomediastinal contours. IMPRESSION: Loculated left pleural effusion with left lower lobe consolidation, concerning for pneumonia. Electronically Signed   By: Deatra Robinson M.D.   On: 06/03/2023 02:20   CT ABDOMEN PELVIS W CONTRAST Result Date: 06/02/2023 CLINICAL DATA:  Left flank pain EXAM: CT ABDOMEN AND PELVIS WITH  CONTRAST TECHNIQUE: Multidetector CT imaging of the abdomen and pelvis was performed using the standard protocol following bolus administration of intravenous contrast. RADIATION DOSE REDUCTION: This exam was performed according to the departmental dose-optimization program which includes automated exposure control, adjustment of the mA and/or kV according to patient size and/or use of iterative reconstruction technique. CONTRAST:  OMNIPAQUE IOHEXOL 300 MG/ML  SOLN COMPARISON:  09/25/2018 FINDINGS: Lower Chest: Loculated left pleural effusion with left lower lobe consolidation. Small area of right middle lobe consolidation anteriorly. Hepatobiliary: Normal hepatic contours. No intra- or extrahepatic biliary dilatation. The gallbladder is normal. Pancreas: Normal pancreas. No ductal dilatation or peripancreatic fluid collection. Spleen: Normal. Adrenals/Urinary Tract: The adrenal glands are normal. No hydronephrosis, nephroureterolithiasis or solid renal mass. The urinary bladder is normal for degree of distention Stomach/Bowel: There is no hiatal hernia. Normal duodenal course and caliber. No small bowel dilatation or inflammation. No focal colonic abnormality. Normal appendix. Vascular/Lymphatic: Normal course and caliber of the major abdominal vessels. No abdominal or pelvic lymphadenopathy. Reproductive: Normal prostate size with symmetric seminal vesicles. Other: None. Musculoskeletal: No bony spinal canal stenosis or focal osseous abnormality. IMPRESSION: 1. Loculated left pleural effusion with left lower lobe consolidation, concerning for post-obstructive pneumonia. 2. Small area of right middle lobe consolidation anteriorly, also concerning for pneumonia. 3. No acute abnormality of the abdomen or pelvis. Electronically Signed   By: Deatra Robinson M.D.   On: 06/02/2023 23:53     I have personally spent 32 minutes involved in face-to-face and non-face-to-face activities for this patient on the day of  the visit. Professional time spent includes the following activities: Preparing to see the patient (review of tests), Obtaining and/or reviewing separately obtained history (admission/discharge record), Performing a medically appropriate examination and/or evaluation , Ordering medications/tests/procedures, referring and communicating with other health care professionals, Documenting clinical information in the EMR, Independently interpreting results (  not separately reported), Communicating results to the patient/family/caregiver, Counseling and educating the patient/family/caregiver and Care coordination (not separately reported).   Marcos Eke, NP [facility]  06/05/2023  1:45 PM

## 2023-06-05 NOTE — Progress Notes (Addendum)
  Holding evening dose of Lovenox.  Will follow-up with H&H first before reinitiating pharmacological DVT prophylaxis.  Continue morphine as needed for pain management. Update, H&H showing stable hemoglobin 11.1 and 32.  Evaluation at bedside showed patient has continuous drainage of blood-tinged output throughout the chest tube.  Patient is also tachycardic heart rate 130-140s range.  Reported that pain 4 out of 10 after giving the morphine. Given patient is having persistent bloody drainage from the chest tube holding Lovenox/heparin for DVT prophylaxis and also Toradol as it can cause platelet dysfunction which can cause excessive bleeding. - Continue morphine 2 to 4 mg every 2 hours as needed for moderate to severe pain control.  Continue oxycodone as needed. Giving 1 L of LR bolus hoping that this can help with the tachycardia however reflex tachycardia secondary to pain response and diaphragmatic irritation from chest tube.  Also patient is withdrawing from cocaine and THC.  Tereasa Coop, MD [facility] 06/05/2023, 7:25 PM

## 2023-06-05 NOTE — TOC Initial Note (Addendum)
Transition of Care [facility]) - Initial/Assessment Note    Patient Details  Name: Brent Bryant MRN: [id number] Date of Birth: [birth year]  [facility] CM/SW Contact:    Harriet Masson, RN Phone Number: 06/05/2023, 2:49 PM  Clinical Narrative:                 Spoke to patient and family at bedside. Patient lives with his Mom and has a car. If patient is unable to drive family is available to assist.  Patient will need MATCH letter at discharge.  PCP confimred.  TOC following for needs.  Expected Discharge Plan: Home/Self Care Barriers to Discharge: Continued Medical Work up   Patient Goals and CMS Choice            Expected Discharge Plan and Services In-house Referral: Financial Counselor     Living arrangements for the past 2 months: Apartment                                      Prior Living Arrangements/Services Living arrangements for the past 2 months: Apartment Lives with:: Parents Patient language and need for interpreter reviewed:: Yes Do you feel safe going back to the place where you live?: Yes            Criminal Activity/Legal Involvement Pertinent to Current Situation/Hospitalization: Yes - Comment as needed  Activities of Daily Living   ADL Screening (condition at time of admission) Independently performs ADLs?: Yes (appropriate for developmental age) Is the patient deaf or have difficulty hearing?: No Does the patient have difficulty seeing, even when wearing glasses/contacts?: No Does the patient have difficulty concentrating, remembering, or making decisions?: No  Permission Sought/Granted Permission sought to share information with : Family Supports Permission granted to share information with : No (Contact information on chart)  Share Information with NAME: Bransyn Adami     Permission granted to share info w Relationship: Sister  Permission granted to share info w Contact Information: [phone]  Emotional  Assessment   Attitude/Demeanor/Rapport: Complaining Affect (typically observed): Agitated Orientation: : Oriented to Self, Oriented to Place, Oriented to  Time, Oriented to Situation Alcohol / Substance Use: Not Applicable Psych Involvement: No (comment)  Admission diagnosis:  Dehydration [E86.0] Hypokalemia [E87.6] Pleural effusion [J90] CAP (community acquired pneumonia) [J18.9] Polysubstance abuse (HCC) [F19.10] Pneumonia due to infectious organism, unspecified laterality, unspecified part of lung [J18.9] Diarrhea, unspecified type [R19.7] Patient Active Problem List   Diagnosis Date Noted   Polysubstance abuse (HCC) 06/04/2023   Severe sepsis (HCC) 06/04/2023   CAP (community acquired pneumonia) 06/03/2023   PCP:  Dartha Lodge, [name] Pharmacy:   [id number] [place], [place] - [address] AT [facility] [address] & [facility] [address] [place] [address] Phone: [phone] Fax: [phone]     Social Drivers of Health (SDOH) Social History: SDOH Screenings   Food Insecurity: Patient Unable To Answer (06/04/2023)  Housing: Patient Unable To Answer (06/04/2023)  Transportation Needs: Patient Unable To Answer (06/04/2023)  Social Connections: Unknown (09/13/2021)   Received from [facility]  Tobacco Use: High Risk (06/02/2023)   SDOH Interventions:     Readmission Risk Interventions     No data to display

## 2023-06-05 NOTE — Progress Notes (Signed)
PROGRESS NOTE    Brent Bryant  ZOX:[id number] DOB: Sep 18, 1990 DOA: 06/02/2023 PCP: Department, [facility]  Outpatient Specialists:     Brief Narrative:  Patient is a 33 year old male, with past medical history significant for polysubstance drug use (claims in remission x 6 years-however UDS positive for cocaine), HCV who [facility] with complaints of left sided chest pain/flank pain x 4 days.  Patient has been admitted for sepsis secondary to left lower lobe pneumonia with probable empyema, polysubstance abuse, continuous and likely withdrawal symptoms.  Toxicology [place] positive for opiates, cocaine and tetrahydrocannabinol.  Alcohol level done on 06/03/2023 was less than 10.  Pulmonary/critical care input is highly appreciated.  Patient has had pigtail catheter placed.  Transthoracic echocardiogram done is nonrevealing.  Cultures are still pending.  Patient is on IV Unasyn, azithromycin and vancomycin.  Patient remains critically ill.  06/04/2023: Patient seen alongside patient's father and nurse.  Patient is unable to provide any history.  Apparently, patient does get some Ativan for withdrawals.  Heart rate is at a rate of 148 bpm.  As documented above, patient continues to look critically ill.  Patient's father was updated extensively.  06/05/2023: Patient seen alongside patient's mother and sister.  Patient is a lot better today.  Worsening leukocytosis.  Patient is currently on IV vancomycin and Unasyn.  Critical care input is appreciated.  Will consult infectious disease team as well.  Patient is more communicative today.  Heart rate has improved significantly (from 148 bpm to 112 bpm).  Patient continues to deny recent drug use.  However, toxicology was positive for tetrahydrocannabinol and cocaine.  Assessment & Plan:   Principal Problem:   CAP (community acquired pneumonia) Active Problems:   Polysubstance abuse (HCC)   Severe sepsis (HCC)   Sepsis 2/2 Left  lobar pneumonia with empyema: -Blood cultures are is pending.   -Sputum Gram stain revealed few GPC in pairs, few gram-negative rods, rare yeast with pseudohyphae.   -Patient is on IV vancomycin and Unasyn. -Infectious disease team has been consulted. -Input from the pulmonary and critical care team is highly appreciated.   -Patient is status post thoracentesis/chest tube placement.  Pleural fluid analysis suggestive of empyema.   -Transthoracic echocardiogram was nonrevealing. -Depending on the results of the blood cultures, patient may need TEE.   Hypokalemia -Resolved. -Potassium of 4.3 today.    History of HCV As per prior documentation: "Patient very vague about whether he got treated-claims he took "pills"-but suspect he has not been treated". -Follow viral load   Polysubstance abuse -UDS positive for cocaine, cannabinoid, opiates-however claims that he is in remission for the past 6 years-he claims that once he finds his phone-he can show [place] a video that would explain his UDS findings. -Patient looks a lot more stable today.     DVT prophylaxis: Subcutaneous Lovenox Code Status: Full code Family Communication: Father Disposition Plan: Patient remains critically ill.   Consultants:  Pulmonary and critical care team Infectious disease  Procedures:  Thoracentesis and pigtail placement (chest tube)  Antimicrobials:  IV vancomycin IV Unasyn   Subjective: -No significant history from patient. -Tmax of 102.7 F   Objective: Vitals:   06/04/23 2231 06/04/23 2319 06/05/23 0400 06/05/23 0533  BP:  95/62 97/71   Pulse: 97 93 78 91  Resp: 19 (!) 22 17 20   Temp:  98.1 F (36.7 C) 98.1 F (36.7 C)   TempSrc:  Axillary Oral   SpO2: 93% 95% 96% 96%  Weight:  Height:        Intake/Output Summary (Last 24 hours) at 06/05/2023 0932 Last data filed at 06/05/2023 0802 Gross per 24 hour  Intake 1737.93 ml  Output 2150 ml  Net -412.07 ml   Filed Weights   06/03/23  2243  Weight: 59 kg    Examination:  General exam: Patient looks more stable today.  Patient is awake and alert.  Not in any distress.     Respiratory system: Decreased air entry. Cardiovascular system: S1 & S2, tachycardic.   Gastrointestinal system: Abdomen is soft and nontender.   Central nervous system: Patient is sedated.   Extremities: No leg edema.  Data Reviewed: I have personally reviewed following labs and imaging studies  CBC: Recent Labs  Lab 06/02/23 1904 06/03/23 0223 06/03/23 1603 06/04/23 0803 06/05/23 0211  WBC 35.9*  --  31.2* 23.9* 32.5*  NEUTROABS 31.6*  --  26.5*  --  25.0*  HGB 13.3 12.2* 11.3* 10.6* 10.9*  HCT 38.7* 36.0* 32.8* 29.5* 31.5*  MCV 88.4  --  89.4 87.5 88.7  PLT 348  --  359 385 411*   Basic Metabolic Panel: Recent Labs  Lab 06/02/23 1904 06/03/23 0223 06/03/23 1603 06/04/23 0803 06/05/23 0211  NA 133* 134* 126* 131* 130*  K 3.2* 3.9 3.9 3.6 4.3  CL 98  --  99 104 102  CO2 22  --  17* 18* 21*  GLUCOSE 107*  --  79 147* 97  BUN 37*  --  18 17 15   CREATININE 0.92  --  0.67 0.77 0.58*  CALCIUM 8.6*  --  7.6* 7.6* 7.3*  MG 2.6*  --   --   --  1.8  PHOS  --   --   --   --  5.5*   GFR: Estimated Creatinine Clearance: 108.6 mL/min (A) (by C-G formula based on SCr of 0.58 mg/dL (L)). Liver Function Tests: Recent Labs  Lab 06/02/23 1904 06/03/23 1603 06/04/23 0803 06/05/23 0211  AST 18 16 15   --   ALT 13 12 10   --   ALKPHOS 260* 163* 128*  --   BILITOT 0.7 1.1 1.1  --   PROT 7.0 5.5* 5.1*  --   ALBUMIN 2.8* <1.5* <1.5* <1.5*   No results for input(s): "LIPASE", "AMYLASE" in the last 168 hours. No results for input(s): "AMMONIA" in the last 168 hours. Coagulation Profile: Recent Labs  Lab 06/03/23 1603  INR 1.2   Cardiac Enzymes: No results for input(s): "CKTOTAL", "CKMB", "CKMBINDEX", "TROPONINI" in the last 168 hours. BNP (last 3 results) No results for input(s): "PROBNP" in the last 8760 hours. HbA1C: No results  for input(s): "HGBA1C" in the last 72 hours. CBG: No results for input(s): "GLUCAP" in the last 168 hours. Lipid Profile: No results for input(s): "CHOL", "HDL", "LDLCALC", "TRIG", "CHOLHDL", "LDLDIRECT" in the last 72 hours. Thyroid Function Tests: No results for input(s): "TSH", "T4TOTAL", "FREET4", "T3FREE", "THYROIDAB" in the last 72 hours. Anemia Panel: No results for input(s): "VITAMINB12", "FOLATE", "FERRITIN", "TIBC", "IRON", "RETICCTPCT" in the last 72 hours. Urine analysis:    Component Value Date/Time   COLORURINE YELLOW 06/02/2023 2216   APPEARANCEUR CLEAR 06/02/2023 2216   LABSPEC 1.021 06/02/2023 2216   PHURINE 6.0 06/02/2023 2216   GLUCOSEU NEGATIVE 06/02/2023 2216   HGBUR NEGATIVE 06/02/2023 2216   BILIRUBINUR NEGATIVE 06/02/2023 2216   KETONESUR NEGATIVE 06/02/2023 2216   PROTEINUR TRACE (A) 06/02/2023 2216   NITRITE NEGATIVE 06/02/2023 2216   LEUKOCYTESUR NEGATIVE 06/02/2023  2216   Sepsis Labs: @LABRCNTIP (procalcitonin:4,lacticidven:4)  ) Recent Results (from the past 240 hours)  Resp panel by RT-PCR (RSV, Flu A&B, Covid) Anterior Nasal Swab     Status: None   Collection Time: 06/02/23  6:16 PM   Specimen: Anterior Nasal Swab  Result Value Ref Range Status   SARS Coronavirus 2 by RT PCR NEGATIVE NEGATIVE Final    Comment: (NOTE) SARS-CoV-2 target nucleic acids are NOT DETECTED.  The SARS-CoV-2 RNA is generally detectable in upper respiratory specimens during the acute phase of infection. The lowest concentration of SARS-CoV-2 viral copies this assay can detect is 138 copies/mL. A negative result does not preclude SARS-Cov-2 infection and should not be used as the sole basis for treatment or other patient management decisions. A negative result may occur with  improper specimen collection/handling, submission of specimen other than nasopharyngeal swab, presence of viral mutation(s) within the areas targeted by this assay, and inadequate number of  viral copies(<138 copies/mL). A negative result must be combined with clinical observations, patient history, and epidemiological information. The expected result is Negative.  Fact Sheet for Patients:  BloggerCourse.com  Fact Sheet for Healthcare Providers:  SeriousBroker.it  This test is no t yet approved or cleared by the [place] FDA and  has been authorized for detection and/or diagnosis of SARS-CoV-2 by FDA under an Emergency Use Authorization (EUA). This EUA will remain  in effect (meaning this test can be used) for the duration of the COVID-19 declaration under Section 564(b)(1) of the Act, 21 U.S.C.section 360bbb-3(b)(1), unless the authorization is terminated  or revoked sooner.       Influenza A by PCR NEGATIVE NEGATIVE Final   Influenza B by PCR NEGATIVE NEGATIVE Final    Comment: (NOTE) The Xpert Xpress SARS-CoV-2/FLU/RSV plus assay is intended as an aid in the diagnosis of influenza from Nasopharyngeal swab specimens and should not be used as a sole basis for treatment. Nasal washings and aspirates are unacceptable for Xpert Xpress SARS-CoV-2/FLU/RSV testing.  Fact Sheet for Patients: BloggerCourse.com  Fact Sheet for Healthcare Providers: SeriousBroker.it  This test is not yet approved or cleared by the [place] FDA and has been authorized for detection and/or diagnosis of SARS-CoV-2 by FDA under an Emergency Use Authorization (EUA). This EUA will remain in effect (meaning this test can be used) for the duration of the COVID-19 declaration under Section 564(b)(1) of the Act, 21 U.S.C. section 360bbb-3(b)(1), unless the authorization is terminated or revoked.     Resp Syncytial Virus by PCR NEGATIVE NEGATIVE Final    Comment: (NOTE) Fact Sheet for Patients: BloggerCourse.com  Fact Sheet for Healthcare  Providers: SeriousBroker.it  This test is not yet approved or cleared by the [place] FDA and has been authorized for detection and/or diagnosis of SARS-CoV-2 by FDA under an Emergency Use Authorization (EUA). This EUA will remain in effect (meaning this test can be used) for the duration of the COVID-19 declaration under Section 564(b)(1) of the Act, 21 U.S.C. section 360bbb-3(b)(1), unless the authorization is terminated or revoked.  Performed at [facility], [address] [place], [place] [address]   Culture, blood (Routine X 2) w Reflex to ID Panel     Status: None (Preliminary result)   Collection Time: 06/03/23  1:30 AM   Specimen: BLOOD LEFT FOREARM  Result Value Ref Range Status   Specimen Description   Final    BLOOD LEFT FOREARM Performed at [facility], [address], [place], [place] [address]  Special Requests   Final    BOTTLES DRAWN AEROBIC AND ANAEROBIC Blood Culture results may not be optimal due to an inadequate volume of blood received in culture bottles Performed at [facility], [address], [place], [place] [address]    Culture   Final    NO GROWTH 2 DAYS Performed at [facility], [address], [place], [place] [address]    Report Status PENDING  Incomplete  Culture, blood (Routine X 2) w Reflex to ID Panel     Status: None (Preliminary result)   Collection Time: 06/03/23  1:39 AM   Specimen: BLOOD LEFT HAND  Result Value Ref Range Status   Specimen Description   Final    BLOOD LEFT HAND Performed at [facility], [address] [place], [place] [address]    Special Requests   Final    BOTTLES DRAWN AEROBIC AND ANAEROBIC Blood Culture adequate volume Performed at [facility], [address] [place], [place] [address]    Culture   Final    NO GROWTH 2 DAYS Performed at [facility], [address], [place], [place] [address]    Report Status PENDING  Incomplete  Expectorated Sputum Assessment w Gram Stain, Rflx to Resp Cult     Status: None   Collection Time: 06/03/23  4:48 AM   Specimen: Sputum  Result Value Ref Range Status   Specimen Description   Final    SPUTUM Performed at [facility], [address] [place], [place] [address]    Special Requests   Final    Normal Performed at [facility], [address], [place], [place] [address]    Sputum evaluation   Final    THIS SPECIMEN IS ACCEPTABLE FOR SPUTUM CULTURE Performed at [facility], [address], [place], [place] [address]    Report Status 06/03/2023 FINAL  Final  Culture, Respiratory w Gram Stain     Status: None (Preliminary result)   Collection Time: 06/03/23  4:48 AM   Specimen: SPU  Result Value Ref Range Status   Specimen Description SPUTUM  Final   Special Requests Normal Reflexed from [id number]  Final   Gram Stain   Final    MODERATE WBC PRESENT,BOTH PMN AND MONONUCLEAR FEW GRAM POSITIVE COCCI IN PAIRS FEW GRAM NEGATIVE RODS RARE YEAST WITH PSEUDOHYPHAE    Culture   Final    CULTURE REINCUBATED FOR BETTER GROWTH Performed at [facility], [address], [place], [place] [address]    Report Status PENDING  Incomplete  Body fluid culture w Gram Stain     Status: None (Preliminary result)   Collection Time: 06/03/23  4:35 PM   Specimen: Pleural Fluid  Result Value Ref Range Status   Specimen Description FLUID PLEURAL  Final   Special Requests NONE  Final   Gram Stain   Final    FEW WBC PRESENT,BOTH PMN AND MONONUCLEAR NO ORGANISMS SEEN    Culture   Final    NO GROWTH < 12 HOURS Performed at [facility], [address] [place], [place] [address]    Report Status PENDING  Incomplete  MRSA Next Gen by PCR, Nasal     Status: None   Collection Time: 06/04/23 12:20 AM   Specimen: Nasal Mucosa; Nasal Swab  Result Value Ref Range  Status   MRSA by PCR Next Gen NOT DETECTED NOT DETECTED Final  Comment: (NOTE) The GeneXpert MRSA Assay (FDA approved for NASAL specimens only), is one component of a comprehensive MRSA colonization surveillance program. It is not intended to diagnose MRSA infection nor to guide or monitor treatment for MRSA infections. Test performance is not FDA approved in patients less than 53 years old. Performed at [facility], [address] [place], [place] [address]          Radiology Studies: ECHOCARDIOGRAM COMPLETE Result Date: 06/04/2023    ECHOCARDIOGRAM REPORT   Patient Name:   Brent Bryant Date of Exam: 06/04/2023 Medical Rec #:  [id number]    Height:       68.0 in Accession #:    [id number]   Weight:       130.1 lb Date of Birth:  1990-07-26    BSA:          1.702 m Patient Age:    32 years     BP:           119/74 mmHg Patient Gender: M            HR:           111 bpm. Exam Location:  Inpatient Procedure: 2D Echo, Cardiac Doppler and Color Doppler Indications:    Endocarditis  History:        Patient has no prior history of Echocardiogram examinations.  Sonographer:    Amy Chionchio Referring Phys: Lorin Glass IMPRESSIONS  1. Left ventricular ejection fraction, by estimation, is 65 to 70%. Left ventricular ejection fraction by 2D MOD biplane is 68.5 %. The left ventricle has normal function. The left ventricle has no regional wall motion abnormalities. Left ventricular diastolic parameters were normal.  2. Right ventricular systolic function is normal. The right ventricular size is normal. Tricuspid regurgitation signal is inadequate for assessing PA pressure.  3. The mitral valve is grossly normal. No evidence of mitral valve regurgitation.  4. The aortic valve was not well visualized. Aortic valve regurgitation is not visualized.  5. The inferior vena cava is normal in size with <50% respiratory variability, suggesting right atrial pressure of 8 mmHg. Comparison(s): No prior  Echocardiogram. Conclusion(s)/Recommendation(s): No evidence of valvular vegetations on this transthoracic echocardiogram. Consider a transesophageal echocardiogram to exclude infective endocarditis if clinically indicated. FINDINGS  Left Ventricle: Left ventricular ejection fraction, by estimation, is 65 to 70%. Left ventricular ejection fraction by 2D MOD biplane is 68.5 %. The left ventricle has normal function. The left ventricle has no regional wall motion abnormalities. The left ventricular internal cavity size was normal in size. There is no left ventricular hypertrophy. Left ventricular diastolic parameters were normal. Right Ventricle: The right ventricular size is normal. No increase in right ventricular wall thickness. Right ventricular systolic function is normal. Tricuspid regurgitation signal is inadequate for assessing PA pressure. Left Atrium: Left atrial size was normal in size. Right Atrium: Right atrial size was normal in size. Pericardium: There is no evidence of pericardial effusion. Mitral Valve: The mitral valve is grossly normal. No evidence of mitral valve regurgitation. MV peak gradient, 2.2 mmHg. The mean mitral valve gradient is 1.0 mmHg. Tricuspid Valve: The tricuspid valve is normal in structure. Tricuspid valve regurgitation is not demonstrated. Aortic Valve: The aortic valve was not well visualized. Aortic valve regurgitation is not visualized. Aortic valve mean gradient measures 5.0 mmHg. Aortic valve peak gradient measures 10.8 mmHg. Aortic valve area, by VTI measures 2.26 cm. Pulmonic Valve: The pulmonic valve was normal in structure. Pulmonic  valve regurgitation is not visualized. Aorta: The aortic root and ascending aorta are structurally normal, with no evidence of dilitation. Venous: The inferior vena cava is normal in size with less than 50% respiratory variability, suggesting right atrial pressure of 8 mmHg. IAS/Shunts: No atrial level shunt detected by color flow Doppler.   LEFT VENTRICLE PLAX 2D                        Biplane EF (MOD) LVIDd:         4.60 cm         LV Biplane EF:   Left LVIDs:         3.60 cm                          ventricular LV PW:         0.80 cm                          ejection LV IVS:        0.60 cm                          fraction by LVOT diam:     2.10 cm                          2D MOD LV SV:         46                               biplane is LV SV Index:   27                               68.5 %. LVOT Area:     3.46 cm                                Diastology                                LV e' medial:    11.20 cm/s LV Volumes (MOD)               LV E/e' medial:  7.7 LV vol d, MOD    101.0 ml      LV e' lateral:   17.00 cm/s A2C:                           LV E/e' lateral: 5.1 LV vol d, MOD    126.0 ml A4C: LV vol s, MOD    25.3 ml A2C: LV vol s, MOD    50.4 ml A4C: LV SV MOD A2C:   75.7 ml LV SV MOD A4C:   126.0 ml LV SV MOD BP:    87.9 ml RIGHT VENTRICLE          IVC RV Basal diam:  2.70 cm  IVC diam: 1.50 cm TAPSE (M-mode): 2.3 cm LEFT ATRIUM             Index        RIGHT ATRIUM  Index LA Vol (A2C):   50.3 ml 29.55 ml/m  RA Area:     14.30 cm LA Vol (A4C):   44.7 ml 26.26 ml/m  RA Volume:   36.70 ml  21.56 ml/m LA Biplane Vol: 47.9 ml 28.14 ml/m  AORTIC VALVE                     PULMONIC VALVE AV Area (Vmax):    2.08 cm      PV Vmax:       0.96 m/s AV Area (Vmean):   2.31 cm      PV Peak grad:  3.6 mmHg AV Area (VTI):     2.26 cm AV Vmax:           164.00 cm/s AV Vmean:          107.000 cm/s AV VTI:            0.204 m AV Peak Grad:      10.8 mmHg AV Mean Grad:      5.0 mmHg LVOT Vmax:         98.40 cm/s LVOT Vmean:        71.300 cm/s LVOT VTI:          0.133 m LVOT/AV VTI ratio: 0.65  AORTA Ao Root diam: 3.30 cm Ao Asc diam:  2.90 cm MITRAL VALVE MV Area (PHT): 5.46 cm    SHUNTS MV Area VTI:   2.99 cm    Systemic VTI:  0.13 m MV Peak grad:  2.2 mmHg    Systemic Diam: 2.10 cm MV Mean grad:  1.0 mmHg MV Vmax:       0.75 m/s MV  Vmean:      59.1 cm/s MV Decel Time: 139 msec MV E velocity: 86.40 cm/s MV A velocity: 71.10 cm/s MV E/A ratio:  1.22 Zoila Shutter MD Electronically signed by Zoila Shutter MD Signature Date/Time: 06/04/2023/1:37:10 PM    Final    DG Chest Port 1 View Result Date: 06/04/2023 CLINICAL DATA:  Chest tube in place EXAM: PORTABLE CHEST 1 VIEW COMPARISON:  Yesterday FINDINGS: Numerous leads and wires project over the chest. Minimal tracheal deviation to the right. Mild cardiomegaly. A left-sided pleural pigtail catheter is unchanged in position inferiorly. No significant change in moderate left pleural effusion with extensive loculation superiorly and laterally. Relatively diffuse left-sided airspace disease is also similar. The right lung remains clear. IMPRESSION: Relatively similar appearance of loculated left-sided pleural fluid and diffuse left-sided airspace disease. Left-sided pleural catheter remaining in place. Electronically Signed   By: Jeronimo Greaves M.D.   On: 06/04/2023 09:34   DG Chest Port 1 View Result Date: 06/03/2023 CLINICAL DATA:  Chest tube in place. EXAM: PORTABLE CHEST 1 VIEW COMPARISON:  Same day. FINDINGS: Stable cardiomediastinal silhouette. Right lung is clear. Interval placement of pleural drainage catheter in the left lung base. Left pleural effusion is significantly enlarged compared to prior exam with left upper lobe atelectasis or infiltrate. IMPRESSION: Interval placement of pleural drainage catheter into the left lung base. Left pleural effusion is significantly enlarged compared to prior exam with left upper lobe atelectasis or infiltrate. Electronically Signed   By: Lupita Raider M.D.   On: 06/03/2023 16:59        Scheduled Meds:  enoxaparin (LOVENOX) injection  40 mg Subcutaneous Q24H   guaiFENesin  600 mg Oral BID   ketorolac  30 mg Intravenous Q6H   sodium chloride flush  10 mL Intrapleural  Q8H   Continuous Infusions:  ampicillin-sulbactam (UNASYN) IV 3 g (06/05/23  0450)   vancomycin 1,000 mg (06/05/23 0556)     LOS: 2 days    Time spent: 55 minutes    Berton Mount, MD  [facility] Pager #: [phone] 7PM-7AM contact night coverage as above

## 2023-06-05 NOTE — Progress Notes (Signed)
PROGRESS NOTE    Brent Bryant  ZOX:[id number] DOB: 1991-01-09 DOA: 06/02/2023 PCP: Department, [facility]  Outpatient Specialists:     Brief Narrative:  Patient is a 33 year old male, with past medical history significant for polysubstance drug use (claims in remission x 6 years-however UDS positive for cocaine), HCV who presented [facility] with complaints of left sided chest pain/flank pain x 4 days.  Patient has been admitted for sepsis secondary to left lower lobe pneumonia with probable empyema, polysubstance abuse, continuous and likely withdrawal symptoms.  Toxicology [place] positive for opiates, cocaine and tetrahydrocannabinol.  Alcohol level done on 06/03/2023 was less than 10.  Pulmonary/critical care input is highly appreciated.  Patient has had pigtail catheter placed.  Transthoracic echocardiogram done is nonrevealing.  Cultures are still pending.  Patient is on IV Unasyn, azithromycin and vancomycin.  Patient remains critically ill.  06/04/2023: Patient seen alongside patient's father and nurse.  Patient is unable to provide any history.  Apparently, patient does get some Ativan for withdrawals.  Heart rate is at a rate of 148 bpm.  As documented above, patient continues to look critically ill.  Patient's father was updated extensively.  Assessment & Plan:   Principal Problem:   CAP (community acquired pneumonia) Active Problems:   Polysubstance abuse (HCC)   Severe sepsis (HCC)   Sepsis 2/2 Left lobar pneumonia with empyema: -Cultures are still pending. -Patient is on IV vancomycin, Unasyn and azithromycin. -Input from the pulmonary and critical care team is highly appreciated.  Patient has undergone thoracentesis/chest tube placement.  Pleural fluid analysis suggestive of empyema.   -Transthoracic echocardiogram is nonrevealing. -Depending on the results of the blood cultures, patient may need TEE. -Guarded prognosis.   -Have a low threshold to escalate  patient's care to ICU.   -A.m. labs..   Hypokalemia -Last potassium level was 3.6.-Continue to monitor and replete.   History of HCV As per prior documentation: "Patient very vague about whether he got treated-claims he took "pills"-but suspect he has not been treated". -Follow viral load   Polysubstance abuse -UDS positive for cocaine, cannabinoid, opiates-however claims that he is in remission for the past 6 years-he claims that once he finds his phone-he can show [place] a video that would explain his UDS findings. -Patient is likely withdrawing.   DVT prophylaxis: Subcutaneous Lovenox Code Status: Full code Family Communication: Father Disposition Plan: Patient remains critically ill.   Consultants:  Pulmonary and critical care team  Procedures:  Thoracentesis and pigtail placement (chest tube)  Antimicrobials:  IV vancomycin IV Unasyn Azithromycin   Subjective: No history from patient (patient is given benzodiazepine).   Objective: Vitals:   06/04/23 2319 06/05/23 0400 06/05/23 0533 06/05/23 1100  BP: 95/62 97/71    Pulse: 93 78 91   Resp: (!) 22 17 20    Temp: 98.1 F (36.7 C) 98.1 F (36.7 C)  98.1 F (36.7 C)  TempSrc: Axillary Oral  Oral  SpO2: 95% 96% 96%   Weight:      Height:        Intake/Output Summary (Last 24 hours) at 06/05/2023 1410 Last data filed at 06/05/2023 1234 Gross per 24 hour  Intake 1737.93 ml  Output 3250 ml  Net -1512.07 ml   Filed Weights   06/03/23 2243  Weight: 59 kg    Examination:  General exam: Sedated.  Acutely ill looking.  Tachycardic.    Respiratory system: Decreased air entry. Cardiovascular system: S1 & S2, tachycardic.   Gastrointestinal system:  Abdomen is soft and nontender.   Central nervous system: Patient is sedated.   Extremities: No leg edema.  Data Reviewed: I have personally reviewed following labs and imaging studies  CBC: Recent Labs  Lab 06/02/23 1904 06/03/23 0223 06/03/23 1603 06/04/23 0803  06/05/23 0211  WBC 35.9*  --  31.2* 23.9* 32.5*  NEUTROABS 31.6*  --  26.5*  --  25.0*  HGB 13.3 12.2* 11.3* 10.6* 10.9*  HCT 38.7* 36.0* 32.8* 29.5* 31.5*  MCV 88.4  --  89.4 87.5 88.7  PLT 348  --  359 385 411*   Basic Metabolic Panel: Recent Labs  Lab 06/02/23 1904 06/03/23 0223 06/03/23 1603 06/04/23 0803 06/05/23 0211  NA 133* 134* 126* 131* 130*  K 3.2* 3.9 3.9 3.6 4.3  CL 98  --  99 104 102  CO2 22  --  17* 18* 21*  GLUCOSE 107*  --  79 147* 97  BUN 37*  --  18 17 15   CREATININE 0.92  --  0.67 0.77 0.58*  CALCIUM 8.6*  --  7.6* 7.6* 7.3*  MG 2.6*  --   --   --  1.8  PHOS  --   --   --   --  5.5*   GFR: Estimated Creatinine Clearance: 108.6 mL/min (A) (by C-G formula based on SCr of 0.58 mg/dL (L)). Liver Function Tests: Recent Labs  Lab 06/02/23 1904 06/03/23 1603 06/04/23 0803 06/05/23 0211  AST 18 16 15   --   ALT 13 12 10   --   ALKPHOS 260* 163* 128*  --   BILITOT 0.7 1.1 1.1  --   PROT 7.0 5.5* 5.1*  --   ALBUMIN 2.8* <1.5* <1.5* <1.5*   No results for input(s): "LIPASE", "AMYLASE" in the last 168 hours. No results for input(s): "AMMONIA" in the last 168 hours. Coagulation Profile: Recent Labs  Lab 06/03/23 1603  INR 1.2   Cardiac Enzymes: No results for input(s): "CKTOTAL", "CKMB", "CKMBINDEX", "TROPONINI" in the last 168 hours. BNP (last 3 results) No results for input(s): "PROBNP" in the last 8760 hours. HbA1C: No results for input(s): "HGBA1C" in the last 72 hours. CBG: No results for input(s): "GLUCAP" in the last 168 hours. Lipid Profile: No results for input(s): "CHOL", "HDL", "LDLCALC", "TRIG", "CHOLHDL", "LDLDIRECT" in the last 72 hours. Thyroid Function Tests: No results for input(s): "TSH", "T4TOTAL", "FREET4", "T3FREE", "THYROIDAB" in the last 72 hours. Anemia Panel: No results for input(s): "VITAMINB12", "FOLATE", "FERRITIN", "TIBC", "IRON", "RETICCTPCT" in the last 72 hours. Urine analysis:    Component Value Date/Time    COLORURINE YELLOW 06/02/2023 2216   APPEARANCEUR CLEAR 06/02/2023 2216   LABSPEC 1.021 06/02/2023 2216   PHURINE 6.0 06/02/2023 2216   GLUCOSEU NEGATIVE 06/02/2023 2216   HGBUR NEGATIVE 06/02/2023 2216   BILIRUBINUR NEGATIVE 06/02/2023 2216   KETONESUR NEGATIVE 06/02/2023 2216   PROTEINUR TRACE (A) 06/02/2023 2216   NITRITE NEGATIVE 06/02/2023 2216   LEUKOCYTESUR NEGATIVE 06/02/2023 2216   Sepsis Labs: @LABRCNTIP (procalcitonin:4,lacticidven:4)  ) Recent Results (from the past 240 hours)  Resp panel by RT-PCR (RSV, Flu A&B, Covid) Anterior Nasal Swab     Status: None   Collection Time: 06/02/23  6:16 PM   Specimen: Anterior Nasal Swab  Result Value Ref Range Status   SARS Coronavirus 2 by RT PCR NEGATIVE NEGATIVE Final    Comment: (NOTE) SARS-CoV-2 target nucleic acids are NOT DETECTED.  The SARS-CoV-2 RNA is generally detectable in upper respiratory specimens during the acute phase of  infection. The lowest concentration of SARS-CoV-2 viral copies this assay can detect is 138 copies/mL. A negative result does not preclude SARS-Cov-2 infection and should not be used as the sole basis for treatment or other patient management decisions. A negative result may occur with  improper specimen collection/handling, submission of specimen other than nasopharyngeal swab, presence of viral mutation(s) within the areas targeted by this assay, and inadequate number of viral copies(<138 copies/mL). A negative result must be combined with clinical observations, patient history, and epidemiological information. The expected result is Negative.  Fact Sheet for Patients:  BloggerCourse.com  Fact Sheet for Healthcare Providers:  SeriousBroker.it  This test is no t yet approved or cleared by the [place] FDA and  has been authorized for detection and/or diagnosis of SARS-CoV-2 by FDA under an Emergency Use Authorization (EUA). This EUA  will remain  in effect (meaning this test can be used) for the duration of the COVID-19 declaration under Section 564(b)(1) of the Act, 21 U.S.C.section 360bbb-3(b)(1), unless the authorization is terminated  or revoked sooner.       Influenza A by PCR NEGATIVE NEGATIVE Final   Influenza B by PCR NEGATIVE NEGATIVE Final    Comment: (NOTE) The Xpert Xpress SARS-CoV-2/FLU/RSV plus assay is intended as an aid in the diagnosis of influenza from Nasopharyngeal swab specimens and should not be used as a sole basis for treatment. Nasal washings and aspirates are unacceptable for Xpert Xpress SARS-CoV-2/FLU/RSV testing.  Fact Sheet for Patients: BloggerCourse.com  Fact Sheet for Healthcare Providers: SeriousBroker.it  This test is not yet approved or cleared by the [place] FDA and has been authorized for detection and/or diagnosis of SARS-CoV-2 by FDA under an Emergency Use Authorization (EUA). This EUA will remain in effect (meaning this test can be used) for the duration of the COVID-19 declaration under Section 564(b)(1) of the Act, 21 U.S.C. section 360bbb-3(b)(1), unless the authorization is terminated or revoked.     Resp Syncytial Virus by PCR NEGATIVE NEGATIVE Final    Comment: (NOTE) Fact Sheet for Patients: BloggerCourse.com  Fact Sheet for Healthcare Providers: SeriousBroker.it  This test is not yet approved or cleared by the [place] FDA and has been authorized for detection and/or diagnosis of SARS-CoV-2 by FDA under an Emergency Use Authorization (EUA). This EUA will remain in effect (meaning this test can be used) for the duration of the COVID-19 declaration under Section 564(b)(1) of the Act, 21 U.S.C. section 360bbb-3(b)(1), unless the authorization is terminated or revoked.  Performed at [facility], [address] [place], [place] [address]   Culture, blood (Routine X 2) w Reflex to ID Panel     Status: None (Preliminary result)   Collection Time: 06/03/23  1:30 AM   Specimen: BLOOD LEFT FOREARM  Result Value Ref Range Status   Specimen Description   Final    BLOOD LEFT FOREARM Performed at [facility], [address], [place], [place] [address]    Special Requests   Final    BOTTLES DRAWN AEROBIC AND ANAEROBIC Blood Culture results may not be optimal due to an inadequate volume of blood received in culture bottles Performed at [facility], [address], [place], [place] [address]    Culture   Final    NO GROWTH 2 DAYS Performed at [facility], [address] [place], [place] [address]    Report Status PENDING  Incomplete  Culture, blood (Routine X 2) w Reflex to ID Panel  Status: None (Preliminary result)   Collection Time: 06/03/23  1:39 AM   Specimen: BLOOD LEFT HAND  Result Value Ref Range Status   Specimen Description   Final    BLOOD LEFT HAND Performed at [facility], [address], [place], [place] [address]    Special Requests   Final    BOTTLES DRAWN AEROBIC AND ANAEROBIC Blood Culture adequate volume Performed at [facility], [address], [place], [place] [address]    Culture   Final    NO GROWTH 2 DAYS Performed at [facility], [address] [place], [place] [address]    Report Status PENDING  Incomplete  Expectorated Sputum Assessment w Gram Stain, Rflx to Resp Cult     Status: None   Collection Time: 06/03/23  4:48 AM   Specimen: Sputum  Result Value Ref Range Status   Specimen Description   Final    SPUTUM Performed at [facility], [address] [place], [place] [address]    Special Requests   Final    Normal Performed at [facility], [address] [place], [place] [address]    Sputum evaluation   Final     THIS SPECIMEN IS ACCEPTABLE FOR SPUTUM CULTURE Performed at [facility], [address] [place], [place] [address]    Report Status 06/03/2023 FINAL  Final  Culture, Respiratory w Gram Stain     Status: None (Preliminary result)   Collection Time: 06/03/23  4:48 AM   Specimen: SPU  Result Value Ref Range Status   Specimen Description SPUTUM  Final   Special Requests Normal Reflexed from [id number]  Final   Gram Stain   Final    MODERATE WBC PRESENT,BOTH PMN AND MONONUCLEAR FEW GRAM POSITIVE COCCI IN PAIRS FEW GRAM NEGATIVE RODS RARE YEAST WITH PSEUDOHYPHAE    Culture   Final    RARE STREPTOCOCCUS GROUP F Beta hemolytic streptococci are predictably susceptible to penicillin and other beta lactams. Susceptibility testing not routinely performed. WITHIN NORMAL RESPIRATORY FLORA Performed at [facility], [address], [place], [place] [address]    Report Status PENDING  Incomplete  Body fluid culture w Gram Stain     Status: None (Preliminary result)   Collection Time: 06/03/23  4:35 PM   Specimen: Pleural Fluid  Result Value Ref Range Status   Specimen Description FLUID PLEURAL  Final   Special Requests NONE  Final   Gram Stain   Final    FEW WBC PRESENT,BOTH PMN AND MONONUCLEAR NO ORGANISMS SEEN    Culture   Final    NO GROWTH 2 DAYS Performed at [facility], [address] [place], [place] [address]    Report Status PENDING  Incomplete  MRSA Next Gen by PCR, Nasal     Status: None   Collection Time: 06/04/23 12:20 AM   Specimen: Nasal Mucosa; Nasal Swab  Result Value Ref Range Status   MRSA by PCR Next Gen NOT DETECTED NOT DETECTED Final    Comment: (NOTE) The GeneXpert MRSA Assay (FDA approved for NASAL specimens only), is one component of a comprehensive MRSA colonization surveillance program. It is not intended to diagnose MRSA infection nor to guide or monitor treatment for MRSA infections. Test performance is not FDA approved in patients less  than 56 years old. Performed at [facility], [address], [place], [place] [address]  Radiology Studies: DG CHEST PORT 1 VIEW Result Date: 06/05/2023 CLINICAL DATA:  Chest tube in place. EXAM: PORTABLE CHEST 1 VIEW COMPARISON:  Radiographs 06/04/2023 and 06/03/2023. Abdominal CT 06/02/2023. FINDINGS: 0527 hours. Left pigtail chest tube is unchanged in position, overlying the medial left lung base. Lobulated left pleural effusion appears mildly decreased in volume, and there is improved aeration of the left lung. The right lung is clear. No evidence of pneumothorax. The visualized heart size and mediastinal contours are stable. IMPRESSION: Mild decrease in volume of left pleural effusion with improved aeration of the left lung. No evidence of pneumothorax. Electronically Signed   By: Carey Bullocks M.D.   On: 06/05/2023 10:12   ECHOCARDIOGRAM COMPLETE Result Date: 06/04/2023    ECHOCARDIOGRAM REPORT   Patient Name:   Brent Bryant Date of Exam: 06/04/2023 Medical Rec #:  [id number]    Height:       68.0 in Accession #:    [id number]   Weight:       130.1 lb Date of Birth:  23-Apr-1991    BSA:          1.702 m Patient Age:    32 years     BP:           119/74 mmHg Patient Gender: M            HR:           111 bpm. Exam Location:  Inpatient Procedure: 2D Echo, Cardiac Doppler and Color Doppler Indications:    Endocarditis  History:        Patient has no prior history of Echocardiogram examinations.  Sonographer:    Amy Chionchio Referring Phys: Lorin Glass IMPRESSIONS  1. Left ventricular ejection fraction, by estimation, is 65 to 70%. Left ventricular ejection fraction by 2D MOD biplane is 68.5 %. The left ventricle has normal function. The left ventricle has no regional wall motion abnormalities. Left ventricular diastolic parameters were normal.  2. Right ventricular systolic function is normal. The right ventricular size is normal. Tricuspid regurgitation signal is inadequate for  assessing PA pressure.  3. The mitral valve is grossly normal. No evidence of mitral valve regurgitation.  4. The aortic valve was not well visualized. Aortic valve regurgitation is not visualized.  5. The inferior vena cava is normal in size with <50% respiratory variability, suggesting right atrial pressure of 8 mmHg. Comparison(s): No prior Echocardiogram. Conclusion(s)/Recommendation(s): No evidence of valvular vegetations on this transthoracic echocardiogram. Consider a transesophageal echocardiogram to exclude infective endocarditis if clinically indicated. FINDINGS  Left Ventricle: Left ventricular ejection fraction, by estimation, is 65 to 70%. Left ventricular ejection fraction by 2D MOD biplane is 68.5 %. The left ventricle has normal function. The left ventricle has no regional wall motion abnormalities. The left ventricular internal cavity size was normal in size. There is no left ventricular hypertrophy. Left ventricular diastolic parameters were normal. Right Ventricle: The right ventricular size is normal. No increase in right ventricular wall thickness. Right ventricular systolic function is normal. Tricuspid regurgitation signal is inadequate for assessing PA pressure. Left Atrium: Left atrial size was normal in size. Right Atrium: Right atrial size was normal in size. Pericardium: There is no evidence of pericardial effusion. Mitral Valve: The mitral valve is grossly normal. No evidence of mitral valve regurgitation. MV peak gradient, 2.2 mmHg. The mean mitral valve gradient is 1.0 mmHg. Tricuspid Valve: The tricuspid valve is normal in structure. Tricuspid valve regurgitation is not demonstrated. Aortic Valve: The  aortic valve was not well visualized. Aortic valve regurgitation is not visualized. Aortic valve mean gradient measures 5.0 mmHg. Aortic valve peak gradient measures 10.8 mmHg. Aortic valve area, by VTI measures 2.26 cm. Pulmonic Valve: The pulmonic valve was normal in structure.  Pulmonic valve regurgitation is not visualized. Aorta: The aortic root and ascending aorta are structurally normal, with no evidence of dilitation. Venous: The inferior vena cava is normal in size with less than 50% respiratory variability, suggesting right atrial pressure of 8 mmHg. IAS/Shunts: No atrial level shunt detected by color flow Doppler.  LEFT VENTRICLE PLAX 2D                        Biplane EF (MOD) LVIDd:         4.60 cm         LV Biplane EF:   Left LVIDs:         3.60 cm                          ventricular LV PW:         0.80 cm                          ejection LV IVS:        0.60 cm                          fraction by LVOT diam:     2.10 cm                          2D MOD LV SV:         46                               biplane is LV SV Index:   27                               68.5 %. LVOT Area:     3.46 cm                                Diastology                                LV e' medial:    11.20 cm/s LV Volumes (MOD)               LV E/e' medial:  7.7 LV vol d, MOD    101.0 ml      LV e' lateral:   17.00 cm/s A2C:                           LV E/e' lateral: 5.1 LV vol d, MOD    126.0 ml A4C: LV vol s, MOD    25.3 ml A2C: LV vol s, MOD    50.4 ml A4C: LV SV MOD A2C:   75.7 ml LV SV MOD A4C:   126.0 ml LV SV MOD BP:    87.9 ml RIGHT VENTRICLE  IVC RV Basal diam:  2.70 cm  IVC diam: 1.50 cm TAPSE (M-mode): 2.3 cm LEFT ATRIUM             Index        RIGHT ATRIUM           Index LA Vol (A2C):   50.3 ml 29.55 ml/m  RA Area:     14.30 cm LA Vol (A4C):   44.7 ml 26.26 ml/m  RA Volume:   36.70 ml  21.56 ml/m LA Biplane Vol: 47.9 ml 28.14 ml/m  AORTIC VALVE                     PULMONIC VALVE AV Area (Vmax):    2.08 cm      PV Vmax:       0.96 m/s AV Area (Vmean):   2.31 cm      PV Peak grad:  3.6 mmHg AV Area (VTI):     2.26 cm AV Vmax:           164.00 cm/s AV Vmean:          107.000 cm/s AV VTI:            0.204 m AV Peak Grad:      10.8 mmHg AV Mean Grad:      5.0 mmHg LVOT Vmax:          98.40 cm/s LVOT Vmean:        71.300 cm/s LVOT VTI:          0.133 m LVOT/AV VTI ratio: 0.65  AORTA Ao Root diam: 3.30 cm Ao Asc diam:  2.90 cm MITRAL VALVE MV Area (PHT): 5.46 cm    SHUNTS MV Area VTI:   2.99 cm    Systemic VTI:  0.13 m MV Peak grad:  2.2 mmHg    Systemic Diam: 2.10 cm MV Mean grad:  1.0 mmHg MV Vmax:       0.75 m/s MV Vmean:      59.1 cm/s MV Decel Time: 139 msec MV E velocity: 86.40 cm/s MV A velocity: 71.10 cm/s MV E/A ratio:  1.22 Zoila Shutter MD Electronically signed by Zoila Shutter MD Signature Date/Time: 06/04/2023/1:37:10 PM    Final    DG Chest Port 1 View Result Date: 06/04/2023 CLINICAL DATA:  Chest tube in place EXAM: PORTABLE CHEST 1 VIEW COMPARISON:  Yesterday FINDINGS: Numerous leads and wires project over the chest. Minimal tracheal deviation to the right. Mild cardiomegaly. A left-sided pleural pigtail catheter is unchanged in position inferiorly. No significant change in moderate left pleural effusion with extensive loculation superiorly and laterally. Relatively diffuse left-sided airspace disease is also similar. The right lung remains clear. IMPRESSION: Relatively similar appearance of loculated left-sided pleural fluid and diffuse left-sided airspace disease. Left-sided pleural catheter remaining in place. Electronically Signed   By: Jeronimo Greaves M.D.   On: 06/04/2023 09:34   DG Chest Port 1 View Result Date: 06/03/2023 CLINICAL DATA:  Chest tube in place. EXAM: PORTABLE CHEST 1 VIEW COMPARISON:  Same day. FINDINGS: Stable cardiomediastinal silhouette. Right lung is clear. Interval placement of pleural drainage catheter in the left lung base. Left pleural effusion is significantly enlarged compared to prior exam with left upper lobe atelectasis or infiltrate. IMPRESSION: Interval placement of pleural drainage catheter into the left lung base. Left pleural effusion is significantly enlarged compared to prior exam with left upper lobe atelectasis or infiltrate.  Electronically Signed   By: Fayrene Fearing  Christen Butter M.D.   On: 06/03/2023 16:59        Scheduled Meds:  enoxaparin (LOVENOX) injection  40 mg Subcutaneous Q24H   guaiFENesin  600 mg Oral BID   ketorolac  30 mg Intravenous Q6H   sodium chloride flush  10 mL Intrapleural Q8H   Continuous Infusions:  ampicillin-sulbactam (UNASYN) IV 3 g (06/05/23 1020)     LOS: 2 days    Time spent: 55 minutes    Berton Mount, MD  [facility] Pager #: [phone] 7PM-7AM contact night coverage as above

## 2023-06-05 NOTE — TOC Initial Note (Signed)
Transition of Care [facility]) - Initial/Assessment Note    Patient Details  Name: Brent Bryant MRN: [id number] Date of Birth: 1991/02/07  Transition of Care [facility]) CM/SW Contact:    Marliss Coots, LCSW Phone Number: 06/05/2023, 9:32 AM  Clinical Narrative:                  9:32 AM Per Storm Frisk of financial counseling, patient has been referred to [facility] screening. Per chart review, patient is followed at [facility] Department.    Barriers to Discharge: Continued Medical Work up   Patient Goals and CMS Choice            Expected Discharge Plan and Services       Living arrangements for the past 2 months: Apartment                                      Prior Living Arrangements/Services Living arrangements for the past 2 months: Apartment Lives with:: Self Patient language and need for interpreter reviewed:: Yes              Criminal Activity/Legal Involvement Pertinent to Current Situation/Hospitalization: Yes - Comment as needed  Activities of Daily Living   ADL Screening (condition at time of admission) Independently performs ADLs?: Yes (appropriate for developmental age) Is the patient deaf or have difficulty hearing?: No Does the patient have difficulty seeing, even when wearing glasses/contacts?: No Does the patient have difficulty concentrating, remembering, or making decisions?: No  Permission Sought/Granted Permission sought to share information with : Family Supports Permission granted to share information with : No (Contact information on chart)  Share Information with NAME: Numair Masden     Permission granted to share info w Relationship: Sister  Permission granted to share info w Contact Information: [phone]  Emotional Assessment       Orientation: : Oriented to Self, Oriented to Place, Oriented to  Time, Oriented to Situation Alcohol / Substance Use: Not Applicable Psych Involvement: No  (comment)  Admission diagnosis:  Dehydration [E86.0] Hypokalemia [E87.6] Pleural effusion [J90] CAP (community acquired pneumonia) [J18.9] Polysubstance abuse (HCC) [F19.10] Pneumonia due to infectious organism, unspecified laterality, unspecified part of lung [J18.9] Diarrhea, unspecified type [R19.7] Patient Active Problem List   Diagnosis Date Noted   Polysubstance abuse (HCC) 06/04/2023   Severe sepsis (HCC) 06/04/2023   CAP (community acquired pneumonia) 06/03/2023   PCP:  Department, [facility] Pharmacy:   [id number] [place], [place] - [address] AT [facility] OF [address] & [facility] [address] [place] [address] Phone: [phone] Fax: [phone]     Social Drivers of Health (SDOH) Social History: SDOH Screenings   Food Insecurity: Patient Unable To Answer (06/04/2023)  Housing: Patient Unable To Answer (06/04/2023)  Transportation Needs: Patient Unable To Answer (06/04/2023)  Social Connections: Unknown (09/13/2021)   Received from [facility]  Tobacco Use: High Risk (06/02/2023)   SDOH Interventions:     Readmission Risk Interventions     No data to display

## 2023-06-05 NOTE — Progress Notes (Signed)
PCCM Progress Note  Called to bedside for patient reporting pain and bedside RN concerned for bleeding at chest tube site. Evaluated the patient and no palpable hematoma noted. Dressing with blood and when examined no evidence of active bleed, suture in place. Luer lock at the cockstop loose and may have leaked. Dressing replaced. Chest tube drainage with 600 cc serosanguinous output, no frank blood noted. Hemodynamically stable with SBP 120s with tachycardia 140s due to pain. Recently given morphine 4 mg at 1800.  Trend vitals Continue chest tube suction -20 cm H20 Continue to monitor output H/H ordered at [facility] team contacted to check out to night team May need additional pain meds

## 2023-06-05 NOTE — Procedures (Signed)
Pleural Fibrinolytic Administration Procedure Note  Brent Bryant  [id number]  May 17, 1990  Date:06/05/23  Time:3:14 PM   Provider Performing:Sena Clouatre Mechele Collin, MD  Procedure: Pleural Fibrinolysis Subsequent day [phone])  Indication(s) Fibrinolysis of complicated pleural effusion  Consent Risks of the procedure as well as the alternatives and risks of each were explained to the patient and/or caregiver.  Consent for the procedure was obtained.   Anesthesia None   Time Out Verified patient identification, verified procedure, site/side was marked, verified correct patient position, special equipment/implants available, medications/allergies/relevant history reviewed, required imaging and test results available.   Sterile Technique Hand hygiene, gloves   Procedure Description Existing pleural catheter was cleaned and accessed in sterile manner.  10mg  of tPA in 30cc of saline and 5mg  of dornase in 30cc of sterile water were injected into pleural space using existing pleural catheter.  Catheter will be clamped for 1 hour and then placed back to suction.   Complications/Tolerance None; patient tolerated the procedure well.   EBL None   Specimen(s) None

## 2023-06-05 NOTE — Plan of Care (Signed)

## 2023-06-06 ENCOUNTER — Inpatient Hospital Stay (HOSPITAL_COMMUNITY): Payer: MEDICAID

## 2023-06-06 DIAGNOSIS — F1910 Other psychoactive substance abuse, uncomplicated: Secondary | ICD-10-CM

## 2023-06-06 DIAGNOSIS — E43 Unspecified severe protein-calorie malnutrition: Secondary | ICD-10-CM | POA: Insufficient documentation

## 2023-06-06 LAB — CBC
HCT: 31.2 % — ABNORMAL LOW (ref 39.0–52.0)
Hemoglobin: 10.6 g/dL — ABNORMAL LOW (ref 13.0–17.0)
MCH: 30.5 pg (ref 26.0–34.0)
MCHC: 34 g/dL (ref 30.0–36.0)
MCV: 89.7 fL (ref 80.0–100.0)
Platelets: 514 10*3/uL — ABNORMAL HIGH (ref 150–400)
RBC: 3.48 MIL/uL — ABNORMAL LOW (ref 4.22–5.81)
RDW: 14.2 % (ref 11.5–15.5)
WBC: 29.2 10*3/uL — ABNORMAL HIGH (ref 4.0–10.5)
nRBC: 0 % (ref 0.0–0.2)

## 2023-06-06 LAB — COMPREHENSIVE METABOLIC PANEL
ALT: 11 U/L (ref 0–44)
AST: 17 U/L (ref 15–41)
Albumin: <1.5 g/dL — ABNORMAL LOW (ref 3.5–5.0)
Alkaline Phosphatase: 74 U/L (ref 38–126)
Anion gap: 7 (ref 5–15)
BUN: 12 mg/dL (ref 6–20)
CO2: 22 mmol/L (ref 22–32)
Calcium: 7.1 mg/dL — ABNORMAL LOW (ref 8.9–10.3)
Chloride: 102 mmol/L (ref 98–111)
Creatinine, Ser: 0.7 mg/dL (ref 0.61–1.24)
GFR, Estimated: >60 mL/min (ref >60)
Glucose, Bld: 97 mg/dL (ref 70–99)
Potassium: 4.4 mmol/L (ref 3.5–5.1)
Sodium: 131 mmol/L — ABNORMAL LOW (ref 135–145)
Total Bilirubin: 0.5 mg/dL (ref 0.0–1.2)
Total Protein: 4.4 g/dL — ABNORMAL LOW (ref 6.5–8.1)

## 2023-06-06 LAB — CULTURE, RESPIRATORY W GRAM STAIN: Special Requests: NORMAL

## 2023-06-06 LAB — CYTOLOGY - NON PAP

## 2023-06-06 LAB — HEPATITIS B SURFACE ANTIGEN: Hepatitis B Surface Ag: NONREACTIVE

## 2023-06-06 MED ORDER — METOPROLOL TARTRATE 5 MG/5ML IV SOLN
2.5000 mg | INTRAVENOUS | Status: AC
  Administered 2023-06-06: 2.5 mg via INTRAVENOUS
  Filled 2023-06-06: qty 5

## 2023-06-06 MED ORDER — SODIUM CHLORIDE 0.9 % IV BOLUS
1000.0000 mL | INTRAVENOUS | Status: AC
  Administered 2023-06-06: 1000 mL via INTRAVENOUS

## 2023-06-06 MED ORDER — ADULT MULTIVITAMIN W/MINERALS CH
1.0000 | ORAL_TABLET | Freq: Every day | ORAL | Status: DC
  Administered 2023-06-06 – 2023-06-12 (×6): 1 via ORAL
  Filled 2023-06-06 (×7): qty 1

## 2023-06-06 MED ORDER — METOPROLOL TARTRATE 5 MG/5ML IV SOLN
2.5000 mg | INTRAVENOUS | Status: DC | PRN
  Administered 2023-06-06 – 2023-06-08 (×3): 2.5 mg via INTRAVENOUS
  Filled 2023-06-06 (×3): qty 5

## 2023-06-06 MED ORDER — SODIUM CHLORIDE 0.9 % IV SOLN: INTRAVENOUS | Status: DC

## 2023-06-06 MED ORDER — IOHEXOL 350 MG/ML SOLN
75.0000 mL | Freq: Once | INTRAVENOUS | Status: AC | PRN
  Administered 2023-06-06: 75 mL via INTRAVENOUS

## 2023-06-06 MED ORDER — SODIUM CHLORIDE 0.9 % IV BOLUS: 1000.0000 mL | Freq: Once | INTRAVENOUS | Status: DC

## 2023-06-06 NOTE — Progress Notes (Addendum)
 PROGRESS NOTE        PATIENT DETAILS Name: Brent Bryant Age: 33 y.o. Sex: male Date of Birth: [birth year] Admit Date: 06/02/2023 Admitting Physician [name], MD ERE:[name], [name], [name]  Brief Summary: Patient is a 33 y.o.  male with history polysubstance abuse, chronic HCV-who presented with severe left-sided chest pain/flank pain-found to have sepsis secondary to left lower lobe pneumonia with empyema  Significant events: 2/01>> admit to [facility] consult-chest tube placed (pleural fluid-LDH 1174, WBC 7700)  Significant studies: 1/31>> CT abdomen/pelvis: Loculated pleural effusion-hide left lower PNA. 2/02>> TTE: EF 65-70%  Significant microbiology data: 1/31>> COVID/influenza/RSV PCR: Negative 2/01>> pleural fluid culture: No growth 2/01>> sputum culture: Negative 2/01>> blood culture: No growth 2/04>> blood culture: Pending  Procedures: 2/01>> chest tube placement by PCCM  Consults: PCCM ID  Subjective: Appears comfortable-sleeping-father at bedside-no nausea, vomiting.  No shortness of breath.  Objective: Vitals: Blood pressure (!) 86/61, pulse (!) 122, temperature 98 F (36.7 C), temperature source Axillary, resp. rate 12, height 5' 8 (1.727 m), weight 59 kg, SpO2 90%.   Exam: Gen Exam:Alert awake-not in any distress.  Emaciated/cachectic appearing. HEENT:atraumatic, normocephalic Chest: Diminished air entry on the left-but otherwise clear to auscultation. CVS:S1S2 regular Abdomen:soft non tender, non distended Extremities:no edema Neurology: Non focal Skin: no rash  Pertinent Labs/Radiology:    Latest Ref Rng & Units 06/06/2023    4:35 AM 06/05/2023    8:05 PM 06/05/2023    2:11 AM  CBC  WBC 4.0 - 10.5 K/uL 29.2   32.5   Hemoglobin 13.0 - 17.0 g/dL 89.3  88.8  89.0   Hematocrit 39.0 - 52.0 % 31.2  31.2  31.5   Platelets 150 - 400 K/uL 514   411     Lab Results  Component Value Date   NA 131 (L) 06/06/2023   K 4.4  06/06/2023   CL 102 06/06/2023   CO2 22 06/06/2023    Assessment/Plan: Sepsis secondary to left lower lobe PNA with empyema-s/p chest tube placement by PCCM on 2/1 Sepsis physiology has improved Remains on Unasyn -cultures unrevealing so far PCCM following-s/p  intrapleural lytics on 2/2 and 2/3 ID following as well.  Dental caries/poor oral hygiene Orthopantogram pending We do not have dentist coverage at [facility] need outpatient follow-up.  Chronic HCV Viral load pending  Normocytic anemia Secondary to acute illness Follow CBC  Polysubstance abuse-UDS positive for opiates/cocaine/THC Watch for withdrawal symptoms.  Underweight: Cachectic/emaciated-will get nutrition evaluation HIV negative Check TSH  Nutrition Status: Nutrition Problem: Severe Malnutrition Etiology: social / environmental circumstances Signs/Symptoms: severe muscle depletion, severe fat depletion Interventions: MVI, Hormel Shake    Estimated body mass index is 19.78 kg/m as calculated from the following:   Height as of this encounter: 5' 8 (1.727 m).   Weight as of this encounter: 59 kg.   Code status:   Code Status: Full Code   DVT Prophylaxis: Place and maintain sequential compression device Start: 06/05/23 2016   Family Communication: Father at bedside   Disposition Plan: Status is: Inpatient Remains inpatient appropriate because:    Planned Discharge Destination:Home   Diet: Diet Order             Diet regular Room service appropriate? Yes; Fluid consistency: Thin  Diet effective now  Antimicrobial agents: Anti-infectives (From admission, onward)    Start     Dose/Rate Route Frequency Ordered Stop   06/03/23 2200  azithromycin  (ZITHROMAX ) 500 mg in sodium chloride  0.9 % 250 mL IVPB        500 mg 250 mL/hr over 60 Minutes Intravenous Every 24 hours 06/03/23 1529 06/05/23 1515   06/03/23 1730  vancomycin  (VANCOCIN ) IVPB 1000 mg/200 mL premix   Status:  Discontinued        1,000 mg 200 mL/hr over 60 Minutes Intravenous Every 12 hours 06/03/23 1541 06/05/23 1407   06/03/23 1630  Ampicillin -Sulbactam (UNASYN ) 3 g in sodium chloride  0.9 % 100 mL IVPB        3 g 200 mL/hr over 30 Minutes Intravenous Every 6 hours 06/03/23 1541     06/03/23 0000  azithromycin  (ZITHROMAX ) 500 mg in sodium chloride  0.9 % 250 mL IVPB        500 mg 250 mL/hr over 60 Minutes Intravenous  Once 06/02/23 2351 06/03/23 0143   06/02/23 2345  cefTRIAXone  (ROCEPHIN ) 1 g in sodium chloride  0.9 % 100 mL IVPB        1 g 200 mL/hr over 30 Minutes Intravenous  Once 06/02/23 2342 06/03/23 0024   06/02/23 2345  metroNIDAZOLE  (FLAGYL ) IVPB 500 mg  Status:  Discontinued        500 mg 100 mL/hr over 60 Minutes Intravenous  Once 06/02/23 2342 06/02/23 2350        MEDICATIONS: Scheduled Meds:  guaiFENesin   600 mg Oral BID   lidocaine   1 patch Transdermal Q24H   sodium chloride  flush  10 mL Intrapleural Q8H   Continuous Infusions:  sodium chloride  75 mL/hr at 06/06/23 0349   ampicillin -sulbactam (UNASYN ) IV 3 g (06/06/23 0948)   PRN Meds:.acetaminophen  **OR** acetaminophen , levalbuterol , LORazepam , metoprolol  tartrate, morphine  injection, ondansetron  **OR** ondansetron  (ZOFRAN ) IV, oxyCODONE    I have personally reviewed following labs and imaging studies  LABORATORY DATA: CBC: Recent Labs  Lab 06/02/23 1904 06/03/23 0223 06/03/23 1603 06/04/23 0803 06/05/23 0211 06/05/23 2005 06/06/23 0435  WBC 35.9*  --  31.2* 23.9* 32.5*  --  29.2*  NEUTROABS 31.6*  --  26.5*  --  25.0*  --   --   HGB 13.3   < > 11.3* 10.6* 10.9* 11.1* 10.6*  HCT 38.7*   < > 32.8* 29.5* 31.5* 31.2* 31.2*  MCV 88.4  --  89.4 87.5 88.7  --  89.7  PLT 348  --  359 385 411*  --  514*   < > = values in this interval not displayed.    Basic Metabolic Panel: Recent Labs  Lab 06/02/23 1904 06/03/23 0223 06/03/23 1603 06/04/23 0803 06/05/23 0211 06/06/23 0435  NA 133* 134* 126*  131* 130* 131*  K 3.2* 3.9 3.9 3.6 4.3 4.4  CL 98  --  99 104 102 102  CO2 22  --  17* 18* 21* 22  GLUCOSE 107*  --  79 147* 97 97  BUN 37*  --  18 17 15 12   CREATININE 0.92  --  0.67 0.77 0.58* 0.70  CALCIUM 8.6*  --  7.6* 7.6* 7.3* 7.1*  MG 2.6*  --   --   --  1.8  --   PHOS  --   --   --   --  5.5*  --     GFR: Estimated Creatinine Clearance: 108.6 mL/min (by C-G formula based on SCr of 0.7 mg/dL).  Liver Function Tests:  Recent Labs  Lab 06/02/23 1904 06/03/23 1603 06/04/23 0803 06/05/23 0211 06/06/23 0435  AST 18 16 15   --  17  ALT 13 12 10   --  11  ALKPHOS 260* 163* 128*  --  74  BILITOT 0.7 1.1 1.1  --  0.5  PROT 7.0 5.5* 5.1*  --  4.4*  ALBUMIN 2.8* <1.5* <1.5* <1.5* <1.5*   No results for input(s): LIPASE, AMYLASE in the last 168 hours. No results for input(s): AMMONIA in the last 168 hours.  Coagulation Profile: Recent Labs  Lab 06/03/23 1603  INR 1.2    Cardiac Enzymes: No results for input(s): CKTOTAL, CKMB, CKMBINDEX, TROPONINI in the last 168 hours.  BNP (last 3 results) No results for input(s): PROBNP in the last 8760 hours.  Lipid Profile: No results for input(s): CHOL, HDL, LDLCALC, TRIG, CHOLHDL, LDLDIRECT in the last 72 hours.  Thyroid Function Tests: No results for input(s): TSH, T4TOTAL, FREET4, T3FREE, THYROIDAB in the last 72 hours.  Anemia Panel: No results for input(s): VITAMINB12, FOLATE, FERRITIN, TIBC, IRON, RETICCTPCT in the last 72 hours.  Urine analysis:    Component Value Date/Time   COLORURINE YELLOW 06/02/2023 2216   APPEARANCEUR CLEAR 06/02/2023 2216   LABSPEC 1.021 06/02/2023 2216   PHURINE 6.0 06/02/2023 2216   GLUCOSEU NEGATIVE 06/02/2023 2216   HGBUR NEGATIVE 06/02/2023 2216   BILIRUBINUR NEGATIVE 06/02/2023 2216   KETONESUR NEGATIVE 06/02/2023 2216   PROTEINUR TRACE (A) 06/02/2023 2216   NITRITE NEGATIVE 06/02/2023 2216   LEUKOCYTESUR NEGATIVE 06/02/2023 2216     Sepsis Labs: Lactic Acid, Venous No results found for: LATICACIDVEN  MICROBIOLOGY: Recent Results (from the past 240 hours)  Resp panel by RT-PCR (RSV, Flu A&B, Covid) Anterior Nasal Swab     Status: None   Collection Time: 06/02/23  6:16 PM   Specimen: Anterior Nasal Swab  Result Value Ref Range Status   SARS Coronavirus 2 by RT PCR NEGATIVE NEGATIVE Final    Comment: (NOTE) SARS-CoV-2 target nucleic acids are NOT DETECTED.  The SARS-CoV-2 RNA is generally detectable in upper respiratory specimens during the acute phase of infection. The lowest concentration of SARS-CoV-2 viral copies this assay can detect is 138 copies/mL. A negative result does not preclude SARS-Cov-2 infection and should not be used as the sole basis for treatment or other patient management decisions. A negative result may occur with  improper specimen collection/handling, submission of specimen other than nasopharyngeal swab, presence of viral mutation(s) within the areas targeted by this assay, and inadequate number of viral copies(<138 copies/mL). A negative result must be combined with clinical observations, patient history, and epidemiological information. The expected result is Negative.  Fact Sheet for Patients:  bloggercourse.com  Fact Sheet for Healthcare Providers:  seriousbroker.it  This test is no t yet approved or cleared by the [place]  FDA and  has been authorized for detection and/or diagnosis of SARS-CoV-2 by FDA under an Emergency Use Authorization (EUA). This EUA will remain  in effect (meaning this test can be used) for the duration of the COVID-19 declaration under Section 564(b)(1) of the Act, 21 U.S.C.section 360bbb-3(b)(1), unless the authorization is terminated  or revoked sooner.       Influenza A by PCR NEGATIVE NEGATIVE Final   Influenza B by PCR NEGATIVE NEGATIVE Final    Comment: (NOTE) The Xpert Xpress  SARS-CoV-2/FLU/RSV plus assay is intended as an aid in the diagnosis of influenza from Nasopharyngeal swab specimens and should not be used as a sole  basis for treatment. Nasal washings and aspirates are unacceptable for Xpert Xpress SARS-CoV-2/FLU/RSV testing.  Fact Sheet for Patients: bloggercourse.com  Fact Sheet for Healthcare Providers: seriousbroker.it  This test is not yet approved or cleared by the [place]  FDA and has been authorized for detection and/or diagnosis of SARS-CoV-2 by FDA under an Emergency Use Authorization (EUA). This EUA will remain in effect (meaning this test can be used) for the duration of the COVID-19 declaration under Section 564(b)(1) of the Act, 21 U.S.C. section 360bbb-3(b)(1), unless the authorization is terminated or revoked.     Resp Syncytial Virus by PCR NEGATIVE NEGATIVE Final    Comment: (NOTE) Fact Sheet for Patients: bloggercourse.com  Fact Sheet for Healthcare Providers: seriousbroker.it  This test is not yet approved or cleared by the [place]  FDA and has been authorized for detection and/or diagnosis of SARS-CoV-2 by FDA under an Emergency Use Authorization (EUA). This EUA will remain in effect (meaning this test can be used) for the duration of the COVID-19 declaration under Section 564(b)(1) of the Act, 21 U.S.C. section 360bbb-3(b)(1), unless the authorization is terminated or revoked.  Performed at [facility], [address] [place], [place] [address]   Culture, blood (Routine X 2) w Reflex to ID Panel     Status: None (Preliminary result)   Collection Time: 06/03/23  1:30 AM   Specimen: BLOOD LEFT FOREARM  Result Value Ref Range Status   Specimen Description   Final    BLOOD LEFT FOREARM Performed at [facility], [address], [place], [place] [address]    Special  Requests   Final    BOTTLES DRAWN AEROBIC AND ANAEROBIC Blood Culture results may not be optimal due to an inadequate volume of blood received in culture bottles Performed at [facility], [address], [place], [place] [address]    Culture   Final    NO GROWTH 3 DAYS Performed at [facility], [address], [place], [place] [address]    Report Status PENDING  Incomplete  Culture, blood (Routine X 2) w Reflex to ID Panel     Status: None (Preliminary result)   Collection Time: 06/03/23  1:39 AM   Specimen: BLOOD LEFT HAND  Result Value Ref Range Status   Specimen Description   Final    BLOOD LEFT HAND Performed at [facility], [address] [place], [place] [address]    Special Requests   Final    BOTTLES DRAWN AEROBIC AND ANAEROBIC Blood Culture adequate volume Performed at [facility], [address], [place], [place] [address]    Culture   Final    NO GROWTH 3 DAYS Performed at [facility], [address] [place], [place] [address]    Report Status PENDING  Incomplete  Expectorated Sputum Assessment w Gram Stain, Rflx to Resp Cult     Status: None   Collection Time: 06/03/23  4:48 AM   Specimen: Sputum  Result Value Ref Range Status   Specimen Description   Final    SPUTUM Performed at [facility], [address], [place], [place] [address]    Special Requests   Final    Normal Performed at [facility], [address], [place], [place] [address]    Sputum evaluation   Final    THIS SPECIMEN IS ACCEPTABLE FOR SPUTUM CULTURE Performed at [facility], [address], [place], [place] [address]    Report  Status 06/03/2023 FINAL  Final  Culture, Respiratory w Gram Stain     Status: None (Preliminary result)   Collection Time: 06/03/23  4:48 AM   Specimen: SPU  Result Value Ref Range Status   Specimen Description SPUTUM  Final   Special  Requests Normal Reflexed from [id number]  Final   Gram Stain   Final    MODERATE WBC PRESENT,BOTH PMN AND MONONUCLEAR FEW GRAM POSITIVE COCCI IN PAIRS FEW GRAM NEGATIVE RODS RARE YEAST WITH PSEUDOHYPHAE    Culture   Final    RARE STREPTOCOCCUS GROUP F Beta hemolytic streptococci are predictably susceptible to penicillin and other beta lactams. Susceptibility testing not routinely performed. WITHIN NORMAL RESPIRATORY FLORA Performed at [facility], [address] [place], [place] [address]    Report Status PENDING  Incomplete  Body fluid culture w Gram Stain     Status: None (Preliminary result)   Collection Time: 06/03/23  4:35 PM   Specimen: Pleural Fluid  Result Value Ref Range Status   Specimen Description FLUID PLEURAL  Final   Special Requests NONE  Final   Gram Stain   Final    FEW WBC PRESENT,BOTH PMN AND MONONUCLEAR NO ORGANISMS SEEN    Culture   Final    NO GROWTH 3 DAYS Performed at [facility], [address], [place], [place] [address]    Report Status PENDING  Incomplete  MRSA Next Gen by PCR, Nasal     Status: None   Collection Time: 06/04/23 12:20 AM   Specimen: Nasal Mucosa; Nasal Swab  Result Value Ref Range Status   MRSA by PCR Next Gen NOT DETECTED NOT DETECTED Final    Comment: (NOTE) The GeneXpert MRSA Assay (FDA approved for NASAL specimens only), is one component of a comprehensive MRSA colonization surveillance program. It is not intended to diagnose MRSA infection nor to guide or monitor treatment for MRSA infections. Test performance is not FDA approved in patients less than 27 years old. Performed at [facility], [address], [place], [place] [address]     RADIOLOGY STUDIES/RESULTS: DG Chest Port 1V same Day Result Date: 06/06/2023 CLINICAL DATA:  141880 SOB (shortness of breath) [id number] EXAM: PORTABLE CHEST 1 VIEW COMPARISON:  Chest x-ray 06/05/2023, CT abdomen pelvis 06/02/2023 FINDINGS: Left chest tube with pigtail  overlying the lower medial left hemithorax. The heart and mediastinal contours are within normal limits. Left lung airspace opacity. No pulmonary edema. Interval increase in size of a loculated moderate to large volume left pleural effusion. No right pleural effusion. No pneumothorax. No acute osseous abnormality. IMPRESSION: 1. Interval increase in size of a loculated moderate to large volume left pleural effusion. 2. Underlying left lung airspace opacity may represent combination of infection/inflammation and atelectasis. 3. Left chest tube stable. Electronically Signed   By: Morgane  Naveau M.D.   On: 06/06/2023 07:59   DG CHEST PORT 1 VIEW Result Date: 06/05/2023 CLINICAL DATA:  Pain EXAM: PORTABLE CHEST 1 VIEW COMPARISON:  Chest x-ray 06/05/2023 FINDINGS: Left-sided chest tube is unchanged in position. Left pleural effusion which appears partially loculated is unchanged from prior. There is no pneumothorax. There is a new band of opacity in the right suprahilar region. Patchy opacities in the left lower lung have increased. Cardiomediastinal silhouette is within normal limits. No acute fractures are seen. IMPRESSION: 1. Left-sided chest tube is unchanged in position. 2. Stable partially loculated left pleural effusion. 3. New band of opacity in the right suprahilar  region. 4. Increasing left basilar air atelectasis/airspace disease. Electronically Signed   By: Greig Pique M.D.   On: 06/05/2023 18:30   DG CHEST PORT 1 VIEW Result Date: 06/05/2023 CLINICAL DATA:  Chest tube in place. EXAM: PORTABLE CHEST 1 VIEW COMPARISON:  Radiographs 06/04/2023 and 06/03/2023. Abdominal CT 06/02/2023. FINDINGS: 0527 hours. Left pigtail chest tube is unchanged in position, overlying the medial left lung base. Lobulated left pleural effusion appears mildly decreased in volume, and there is improved aeration of the left lung. The right lung is clear. No evidence of pneumothorax. The visualized heart size and mediastinal  contours are stable. IMPRESSION: Mild decrease in volume of left pleural effusion with improved aeration of the left lung. No evidence of pneumothorax. Electronically Signed   By: Elsie Perone M.D.   On: 06/05/2023 10:12   ECHOCARDIOGRAM COMPLETE Result Date: 06/04/2023    ECHOCARDIOGRAM REPORT   Patient Name:   Brent Bryant Date of Exam: 06/04/2023 Medical Rec #:  [id number]    Height:       68.0 in Accession #:    [id number]   Weight:       130.1 lb Date of Birth:  Nov 13, 1990    BSA:          1.702 m Patient Age:    32 years     BP:           119/74 mmHg Patient Gender: M            HR:           111 bpm. Exam Location:  Inpatient Procedure: 2D Echo, Cardiac Doppler and Color Doppler Indications:    Endocarditis  History:        Patient has no prior history of Echocardiogram examinations.  Sonographer:    Amy Chionchio Referring Phys: TORIBIO JAYSON SHARPS IMPRESSIONS  1. Left ventricular ejection fraction, by estimation, is 65 to 70%. Left ventricular ejection fraction by 2D MOD biplane is 68.5 %. The left ventricle has normal function. The left ventricle has no regional wall motion abnormalities. Left ventricular diastolic parameters were normal.  2. Right ventricular systolic function is normal. The right ventricular size is normal. Tricuspid regurgitation signal is inadequate for assessing PA pressure.  3. The mitral valve is grossly normal. No evidence of mitral valve regurgitation.  4. The aortic valve was not well visualized. Aortic valve regurgitation is not visualized.  5. The inferior vena cava is normal in size with <50% respiratory variability, suggesting right atrial pressure of 8 mmHg. Comparison(s): No prior Echocardiogram. Conclusion(s)/Recommendation(s): No evidence of valvular vegetations on this transthoracic echocardiogram. Consider a transesophageal echocardiogram to exclude infective endocarditis if clinically indicated. FINDINGS  Left Ventricle: Left ventricular ejection fraction, by estimation,  is 65 to 70%. Left ventricular ejection fraction by 2D MOD biplane is 68.5 %. The left ventricle has normal function. The left ventricle has no regional wall motion abnormalities. The left ventricular internal cavity size was normal in size. There is no left ventricular hypertrophy. Left ventricular diastolic parameters were normal. Right Ventricle: The right ventricular size is normal. No increase in right ventricular wall thickness. Right ventricular systolic function is normal. Tricuspid regurgitation signal is inadequate for assessing PA pressure. Left Atrium: Left atrial size was normal in size. Right Atrium: Right atrial size was normal in size. Pericardium: There is no evidence of pericardial effusion. Mitral Valve: The mitral valve is grossly normal. No evidence of mitral valve regurgitation. MV peak gradient, 2.2 mmHg. The mean mitral  valve gradient is 1.0 mmHg. Tricuspid Valve: The tricuspid valve is normal in structure. Tricuspid valve regurgitation is not demonstrated. Aortic Valve: The aortic valve was not well visualized. Aortic valve regurgitation is not visualized. Aortic valve mean gradient measures 5.0 mmHg. Aortic valve peak gradient measures 10.8 mmHg. Aortic valve area, by VTI measures 2.26 cm. Pulmonic Valve: The pulmonic valve was normal in structure. Pulmonic valve regurgitation is not visualized. Aorta: The aortic root and ascending aorta are structurally normal, with no evidence of dilitation. Venous: The inferior vena cava is normal in size with less than 50% respiratory variability, suggesting right atrial pressure of 8 mmHg. IAS/Shunts: No atrial level shunt detected by color flow Doppler.  LEFT VENTRICLE PLAX 2D                        Biplane EF (MOD) LVIDd:         4.60 cm         LV Biplane EF:   Left LVIDs:         3.60 cm                          ventricular LV PW:         0.80 cm                          ejection LV IVS:        0.60 cm                          fraction by LVOT  diam:     2.10 cm                          2D MOD LV SV:         46                               biplane is LV SV Index:   27                               68.5 %. LVOT Area:     3.46 cm                                Diastology                                LV e' medial:    11.20 cm/s LV Volumes (MOD)               LV E/e' medial:  7.7 LV vol d, MOD    101.0 ml      LV e' lateral:   17.00 cm/s A2C:                           LV E/e' lateral: 5.1 LV vol d, MOD    126.0 ml A4C: LV vol s, MOD    25.3 ml A2C: LV vol s, MOD    50.4 ml A4C: LV SV MOD A2C:   75.7 ml LV SV MOD A4C:  126.0 ml LV SV MOD BP:    87.9 ml RIGHT VENTRICLE          IVC RV Basal diam:  2.70 cm  IVC diam: 1.50 cm TAPSE (M-mode): 2.3 cm LEFT ATRIUM             Index        RIGHT ATRIUM           Index LA Vol (A2C):   50.3 ml 29.55 ml/m  RA Area:     14.30 cm LA Vol (A4C):   44.7 ml 26.26 ml/m  RA Volume:   36.70 ml  21.56 ml/m LA Biplane Vol: 47.9 ml 28.14 ml/m  AORTIC VALVE                     PULMONIC VALVE AV Area (Vmax):    2.08 cm      PV Vmax:       0.96 m/s AV Area (Vmean):   2.31 cm      PV Peak grad:  3.6 mmHg AV Area (VTI):     2.26 cm AV Vmax:           164.00 cm/s AV Vmean:          107.000 cm/s AV VTI:            0.204 m AV Peak Grad:      10.8 mmHg AV Mean Grad:      5.0 mmHg LVOT Vmax:         98.40 cm/s LVOT Vmean:        71.300 cm/s LVOT VTI:          0.133 m LVOT/AV VTI ratio: 0.65  AORTA Ao Root diam: 3.30 cm Ao Asc diam:  2.90 cm MITRAL VALVE MV Area (PHT): 5.46 cm    SHUNTS MV Area VTI:   2.99 cm    Systemic VTI:  0.13 m MV Peak grad:  2.2 mmHg    Systemic Diam: 2.10 cm MV Mean grad:  1.0 mmHg MV Vmax:       0.75 m/s MV Vmean:      59.1 cm/s MV Decel Time: 139 msec MV E velocity: 86.40 cm/s MV A velocity: 71.10 cm/s MV E/A ratio:  1.22 Vinie Maxcy MD Electronically signed by Vinie Maxcy MD Signature Date/Time: 06/04/2023/1:37:10 PM    Final      LOS: 3 days   Donalda Applebaum, MD  [facility]    To  contact the attending provider between 7A-7P or the covering provider during after hours 7P-7A, please log into the web site [email or web address] and access using universal [place] password for that web site. If you do not have the password, please call the hospital operator.  06/06/2023, 10:07 AM

## 2023-06-06 NOTE — Progress Notes (Signed)
Upon assuming care of patient, patient moaning in 10/10 left lower chest pain and is not amendable to PRN oxycodone or scheduled Toradol. Patients HR sustaininig in 150's with blood noted to be dripping from chest tube insertion site. PCCM notified and instructed to continue to monitor the bleed and give one time dose of Morphinr.

## 2023-06-06 NOTE — Progress Notes (Signed)
   NAME:  Moshe Rumsey, MRN:  [id number], DOB:  Apr 01, 1991, LOS: 3 ADMISSION DATE:  06/02/2023, CONSULTATION DATE:  06/03/23 REFERRING MD:  Raenelle, CHIEF COMPLAINT:  flank pain   History of Present Illness:  33 year old man with hx of HepC, substance abuse presenting with 3 days of cough and chest pain.   Cough with brown sputum.  Chest pain is pleuritic.  Workup has revealed complex L pleural space and dense pneumonia.  PCCM consulted for assistance with management.  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     Pertinent  Medical History  HepC  Significant Hospital Events: Including procedures, antibiotic start and stop dates in addition to other pertinent events   2/1 admit, small bored chest tube placed  2/2 First dose pleural lytics, out of chest tune since insertion  Interim History / Subjective:  No further bleeding at CT site. Chest tube draining small amount serous fluid. out over last 24hr.   Objective   Blood pressure (!) 86/61, pulse (!) 122, temperature 98 F (36.7 C), temperature source Axillary, resp. rate 12, height 5' 8 (1.727 m), weight 59 kg, SpO2 90%.        Intake/Output Summary (Last 24 hours) at 06/06/2023 1205 Last data filed at 06/06/2023 9374 Gross per 24 hour  Intake 1798.6 ml  Output 1070 ml  Net 728.6 ml   Filed Weights   06/03/23 2243  Weight: 59 kg   Physical Exam: General: drowsy, thin, NAD HENT: [place], AT Eyes: EOMI, no scleral icterus Respiratory: resps even non labored, diminished L, otherwise clear. Chest tube with serous drainage, no air leak, site c/d Cardiovascular: RRR, -M/R/G, no JVD Extremities:-Edema,-tenderness Neuro: drowsy but easily wakes, answers questions appropriately, MAE   Resolved Hospital Problem list   N/A  Assessment & Plan:  Empyema Active substance abuse Severe sepsis P: S/p Intrapleural lytics 2/2and 2/3 Hold further lytics today with interval worsening CXR, reported increased pain  CT chest  further eval s/p 2 doses lytics Routine chest tube care and flushes Chest tube to suction -20 cm H20 Continue Unasyn  Follow cultures  Multimodal pain control    Best Practice (right click and Reselect all SmartList Selections daily)  Per primary  Critical care time: N/A   Rockie Myers, NP Pulmonary/Critical Care Medicine  06/06/2023  12:05 PM   See Amion for personal pager PCCM on call pager [phone] until 7pm. Please call Elink 7p-7a. [phone]

## 2023-06-06 NOTE — Progress Notes (Signed)
 Initial Nutrition Assessment  DOCUMENTATION CODES:   Severe malnutrition in context of social or environmental circumstances  INTERVENTION:  Continue to encourage good oral intake. Multivitamin/minerals Mighty Shake TID with meals, each supplement provides 330 kcals and 9 grams of protein    NUTRITION DIAGNOSIS:   Severe Malnutrition related to social / environmental circumstances as evidenced by severe muscle depletion, severe fat depletion.    GOAL:   Patient will meet greater than or equal to 90% of their needs    MONITOR:   PO intake  REASON FOR ASSESSMENT:   Consult Assessment of nutrition requirement/status  ASSESSMENT:  33 y.o. M, presented [facility] with complaints of left sided chest pain/flank pain x 4 days, intermittent diarrhea 7-10 days. Transferred to [facility] services at [facility] for further evaluation. Found to have sepsis 2/2 left lobar pneumonia with probable empyema. PMH: polysubstance drug use (claims in remission x 6 years-however UDS positive for cocaine), HCV.  Patient stated that he has had poor nutrition for years.  Stated that his appetite was poor.  Does not elaborate on social status and housing. He very thin.  Offered ensures and he declined stating that he probably would not drink them. Family provided cokes and some snacks at bed side.  Offered Mighty shake he agreed to try. RN reports patient not eating any thing.   Admit weight: 59 kg    Average Meal Intake: 50-100: 75% intake x 2 recorded meals  Nutritionally Relevant Medications: Reviewed  Labs Reviewed    NUTRITION - FOCUSED PHYSICAL EXAM:  Flowsheet Row Most Recent Value  Orbital Region Severe depletion  Upper Arm Region Severe depletion  Thoracic and Lumbar Region Severe depletion  Buccal Region Severe depletion  Temple Region Severe depletion  Clavicle Bone Region Severe depletion  Clavicle and Acromion Bone Region Severe depletion  Scapular Bone Region Severe depletion   Dorsal Hand Severe depletion  Patellar Region Severe depletion  Anterior Thigh Region Severe depletion  Posterior Calf Region Severe depletion  Edema (RD Assessment) None  Hair Reviewed  Eyes Reviewed  Mouth Reviewed  [Teeth in poor shape.]  Skin Reviewed  Nails Reviewed       Diet Order:   Diet Order             Diet regular Room service appropriate? Yes; Fluid consistency: Thin  Diet effective now                   EDUCATION NEEDS:   Education needs have been addressed  Skin:     Last BM:  PTA  Height:   Ht Readings from Last 1 Encounters:  06/03/23 5' 8 (1.727 m)    Weight:   Wt Readings from Last 1 Encounters:  06/06/23 59.9 kg    Ideal Body Weight:     BMI:  Body mass index is 20.08 kg/m.  Estimated Nutritional Needs:   Kcal:  2100-2450 kcal  Protein:  95-115 g  Fluid:  69ml/kcal    Jenna Pew RDN, LDN Clinical Dietitian   If unable to reach, please contact RD Inpatient secure chat group between 8 am-4 pm daily

## 2023-06-06 NOTE — Plan of Care (Signed)

## 2023-06-06 NOTE — Care Management (Signed)
 Patient became very agitated regarding IV sites stating that he was going to end up with hepatitis from staff using the same site. Provided education to patient, he became more agitated and began yelling saying the IV site was nasty and full of germs. Both IV accesses removed per patient request and a new 22 gauge placed in right FA.

## 2023-06-07 ENCOUNTER — Other Ambulatory Visit (HOSPITAL_COMMUNITY): Payer: Self-pay

## 2023-06-07 ENCOUNTER — Encounter (HOSPITAL_COMMUNITY): Payer: Self-pay | Admitting: *Deleted

## 2023-06-07 ENCOUNTER — Inpatient Hospital Stay (HOSPITAL_COMMUNITY): Payer: MEDICAID

## 2023-06-07 ENCOUNTER — Encounter (HOSPITAL_COMMUNITY): Payer: Self-pay | Admitting: Internal Medicine

## 2023-06-07 DIAGNOSIS — F17210 Nicotine dependence, cigarettes, uncomplicated: Secondary | ICD-10-CM

## 2023-06-07 DIAGNOSIS — J869 Pyothorax without fistula: Secondary | ICD-10-CM

## 2023-06-07 DIAGNOSIS — B9789 Other viral agents as the cause of diseases classified elsewhere: Secondary | ICD-10-CM

## 2023-06-07 LAB — COMPREHENSIVE METABOLIC PANEL
ALT: 11 U/L (ref 0–44)
AST: 18 U/L (ref 15–41)
Albumin: <1.5 g/dL — ABNORMAL LOW (ref 3.5–5.0)
Alkaline Phosphatase: 72 U/L (ref 38–126)
Anion gap: 7 (ref 5–15)
BUN: 6 mg/dL (ref 6–20)
CO2: 24 mmol/L (ref 22–32)
Calcium: 7.4 mg/dL — ABNORMAL LOW (ref 8.9–10.3)
Chloride: 97 mmol/L — ABNORMAL LOW (ref 98–111)
Creatinine, Ser: 0.47 mg/dL — ABNORMAL LOW (ref 0.61–1.24)
GFR, Estimated: >60 mL/min (ref >60)
Glucose, Bld: 111 mg/dL — ABNORMAL HIGH (ref 70–99)
Potassium: 4.1 mmol/L (ref 3.5–5.1)
Sodium: 128 mmol/L — ABNORMAL LOW (ref 135–145)
Total Bilirubin: 0.3 mg/dL (ref 0.0–1.2)
Total Protein: 4.7 g/dL — ABNORMAL LOW (ref 6.5–8.1)

## 2023-06-07 LAB — CBC WITH DIFFERENTIAL/PLATELET
Abs Immature Granulocytes: 0.3 10*3/uL — ABNORMAL HIGH (ref 0.00–0.07)
Basophils Absolute: 0.1 10*3/uL (ref 0.0–0.1)
Basophils Relative: 0 %
Eosinophils Absolute: 0.1 10*3/uL (ref 0.0–0.5)
Eosinophils Relative: 1 %
HCT: 28.2 % — ABNORMAL LOW (ref 39.0–52.0)
Hemoglobin: 9.6 g/dL — ABNORMAL LOW (ref 13.0–17.0)
Immature Granulocytes: 2 %
Lymphocytes Relative: 15 %
Lymphs Abs: 2.6 10*3/uL (ref 0.7–4.0)
MCH: 31 pg (ref 26.0–34.0)
MCHC: 34 g/dL (ref 30.0–36.0)
MCV: 91 fL (ref 80.0–100.0)
Monocytes Absolute: 1.5 10*3/uL — ABNORMAL HIGH (ref 0.1–1.0)
Monocytes Relative: 8 %
Neutro Abs: 13.1 10*3/uL — ABNORMAL HIGH (ref 1.7–7.7)
Neutrophils Relative %: 74 %
Platelets: 513 10*3/uL — ABNORMAL HIGH (ref 150–400)
RBC: 3.1 MIL/uL — ABNORMAL LOW (ref 4.22–5.81)
RDW: 14.6 % (ref 11.5–15.5)
WBC: 17.6 10*3/uL — ABNORMAL HIGH (ref 4.0–10.5)
nRBC: 0 % (ref 0.0–0.2)

## 2023-06-07 LAB — HEPATITIS B CORE ANTIBODY, TOTAL: HEP B CORE AB: NEGATIVE

## 2023-06-07 LAB — BODY FLUID CULTURE W GRAM STAIN: Culture: NO GROWTH

## 2023-06-07 LAB — HCV RNA QUANT
HCV Quantitative Log: 3.776 {Log} (ref >1.70)
HCV Quantitative: 5970 [IU]/mL (ref >50)

## 2023-06-07 LAB — MAGNESIUM: Magnesium: 1.6 mg/dL — ABNORMAL LOW (ref 1.7–2.4)

## 2023-06-07 LAB — TSH: TSH: 4.249 u[IU]/mL (ref 0.350–4.500)

## 2023-06-07 LAB — HEPATITIS B SURFACE ANTIBODY, QUANTITATIVE: Hep B S AB Quant (Post): 39.5 m[IU]/mL

## 2023-06-07 LAB — PHOSPHORUS: Phosphorus: 3.8 mg/dL (ref 2.5–4.6)

## 2023-06-07 MED ORDER — MIDAZOLAM HCL 2 MG/2ML IJ SOLN
INTRAMUSCULAR | Status: AC | PRN
  Administered 2023-06-07: 1 mg via INTRAVENOUS

## 2023-06-07 MED ORDER — MIDAZOLAM HCL 2 MG/2ML IJ SOLN
INTRAMUSCULAR | Status: AC
  Filled 2023-06-07: qty 4

## 2023-06-07 MED ORDER — FENTANYL CITRATE (PF) 100 MCG/2ML IJ SOLN
INTRAMUSCULAR | Status: AC | PRN
  Administered 2023-06-07: 50 ug via INTRAVENOUS

## 2023-06-07 MED ORDER — LIDOCAINE 5 % EX PTCH
2.0000 | MEDICATED_PATCH | CUTANEOUS | Status: DC
  Administered 2023-06-07 – 2023-06-12 (×6): 2 via TRANSDERMAL
  Filled 2023-06-07 (×6): qty 2

## 2023-06-07 MED ORDER — MAGNESIUM SULFATE 4 GM/100ML IV SOLN
4.0000 g | Freq: Once | INTRAVENOUS | Status: AC
  Administered 2023-06-07: 4 g via INTRAVENOUS
  Filled 2023-06-07: qty 100

## 2023-06-07 MED ORDER — LIDOCAINE-EPINEPHRINE 1 %-1:100000 IJ SOLN
20.0000 mL | Freq: Once | INTRAMUSCULAR | Status: AC
  Administered 2023-06-07: 20 mL via INTRADERMAL
  Filled 2023-06-07: qty 20

## 2023-06-07 MED ORDER — FENTANYL CITRATE (PF) 100 MCG/2ML IJ SOLN
INTRAMUSCULAR | Status: AC
  Filled 2023-06-07: qty 4

## 2023-06-07 MED ORDER — ENSURE ENLIVE PO LIQD
237.0000 mL | Freq: Two times a day (BID) | ORAL | Status: DC
  Administered 2023-06-08 – 2023-06-12 (×10): 237 mL via ORAL

## 2023-06-07 NOTE — Plan of Care (Cosign Needed)
 Pt was irritated, but complacent when answering assessment questions. Pt states that their pain on the left side of their chest was a 10/10 and was given the medication appropriate for it. Pt states that the medication helps a little bit, but the pain just keeps coming back. Patient refused to having any oral or hygenic care done. Pt also refused moving out the bed with PT due to the pain he was having. When trying to flush his chest tube, pt winced in pain multiple times and tolerated 4mLs of normal saline. Continue to monitor patient's pain levels and chest tube drainage.

## 2023-06-07 NOTE — Progress Notes (Signed)
 [facility]  Date of Admission:  06/02/2023     Total days of antibiotics 6         ASSESSMENT:  Brent [facility] CT chest with persistent and progressive loculated left pleural effusion and worsening consolidation/atelectasis in the setting of post-viral secondary empyema. Plan is for IR chest tube placement which will help with source control. Leukocytosis improving. CT maxillofacial with advanced dental and periodontal disease which is the most likely contributing factor and will need to follow up with dentistry. Continue current dose of ampicillin -sulbactam. Therapeutic monitoring of renal and hepatic function during treatment. Immune to Hepatitis B with Hepatitis C RNA level pending. Remaining medical and supportive care per Internal Medicine.   PLAN:  Continue current dose of ampicillin -sulbactam. Chest tube placement per IR. Therapeutic drug monitoring of renal and hepatic function during treatment.  Monitor fever curve and WBC count. Remaining medical and supportive care per Internal Medicine.   Principal Problem:   CAP (community acquired pneumonia) Active Problems:   Polysubstance abuse (HCC)   Severe sepsis (HCC)   Empyema lung (HCC)   Protein-calorie malnutrition, severe    feeding supplement  237 mL Oral BID BM   guaiFENesin   600 mg Oral BID   lidocaine   1 patch Transdermal Q24H   multivitamin with minerals  1 tablet Oral Daily   sodium chloride  flush  10 mL Intrapleural Q8H    SUBJECTIVE:  Afebrile over the last 24 hours with improving leukocytosis. Lethargic. Tolerating antibiotics.   Allergies  Allergen Reactions   Tylenol  [Acetaminophen ] Rash   Vicodin [Hydrocodone-Acetaminophen ] Rash     Review of Systems: Review of Systems  Constitutional:  Negative for chills, fever and weight loss.  Respiratory:  Positive for shortness of breath. Negative for cough and wheezing.   Cardiovascular:  Negative for chest pain and leg  swelling.  Gastrointestinal:  Negative for abdominal pain, constipation, diarrhea, nausea and vomiting.  Skin:  Negative for rash.      OBJECTIVE: Vitals:   06/06/23 2245 06/07/23 0000 06/07/23 0322 06/07/23 0800  BP: 109/71   124/84  Pulse:  (!) 116  (!) 112  Resp:    20  Temp:  98.7 F (37.1 C) 98.9 F (37.2 C) 98.6 F (37 C)  TempSrc:  Oral Oral Oral  SpO2:  92%  94%  Weight:      Height:       Body mass index is 20.08 kg/m.  Physical Exam Constitutional:      General: He is not in acute distress.    Appearance: He is well-developed. He is ill-appearing.  Cardiovascular:     Rate and Rhythm: Normal rate and regular rhythm.     Heart sounds: Normal heart sounds.  Pulmonary:     Effort: Pulmonary effort is normal.     Breath sounds: Normal breath sounds.     Comments: Pigtail drain with mostly clear drainage with some sediment.  Skin:    General: Skin is warm and dry.  Neurological:     Mental Status: He is lethargic.  Psychiatric:        Mood and Affect: Mood normal.     Lab Results Lab Results  Component Value Date   WBC 17.6 (H) 06/07/2023   HGB 9.6 (L) 06/07/2023   HCT 28.2 (L) 06/07/2023   MCV 91.0 06/07/2023   PLT 513 (H) 06/07/2023    Lab Results  Component Value Date   CREATININE 0.47 (L) 06/07/2023   BUN  6 06/07/2023   NA 128 (L) 06/07/2023   K 4.1 06/07/2023   CL 97 (L) 06/07/2023   CO2 24 06/07/2023    Lab Results  Component Value Date   ALT 11 06/07/2023   AST 18 06/07/2023   ALKPHOS 72 06/07/2023   BILITOT 0.3 06/07/2023     Microbiology: Recent Results (from the past 240 hours)  Resp panel by RT-PCR (RSV, Flu A&B, Covid) Anterior Nasal Swab     Status: None   Collection Time: 06/02/23  6:16 PM   Specimen: Anterior Nasal Swab  Result Value Ref Range Status   SARS Coronavirus 2 by RT PCR NEGATIVE NEGATIVE Final    Comment: (NOTE) SARS-CoV-2 target nucleic acids are NOT DETECTED.  The SARS-CoV-2 RNA is generally detectable  in upper respiratory specimens during the acute phase of infection. The lowest concentration of SARS-CoV-2 viral copies this assay can detect is 138 copies/mL. A negative result does not preclude SARS-Cov-2 infection and should not be used as the sole basis for treatment or other patient management decisions. A negative result may occur with  improper specimen collection/handling, submission of specimen other than nasopharyngeal swab, presence of viral mutation(s) within the areas targeted by this assay, and inadequate number of viral copies(<138 copies/mL). A negative result must be combined with clinical observations, patient history, and epidemiological information. The expected result is Negative.  Fact Sheet for Patients:  bloggercourse.com  Fact Sheet for Healthcare Providers:  seriousbroker.it  This test is no t yet approved or cleared by the [place]  FDA and  has been authorized for detection and/or diagnosis of SARS-CoV-2 by FDA under an Emergency Use Authorization (EUA). This EUA will remain  in effect (meaning this test can be used) for the duration of the COVID-19 declaration under Section 564(b)(1) of the Act, 21 U.S.C.section 360bbb-3(b)(1), unless the authorization is terminated  or revoked sooner.       Influenza A by PCR NEGATIVE NEGATIVE Final   Influenza B by PCR NEGATIVE NEGATIVE Final    Comment: (NOTE) The Xpert Xpress SARS-CoV-2/FLU/RSV plus assay is intended as an aid in the diagnosis of influenza from Nasopharyngeal swab specimens and should not be used as a sole basis for treatment. Nasal washings and aspirates are unacceptable for Xpert Xpress SARS-CoV-2/FLU/RSV testing.  Fact Sheet for Patients: bloggercourse.com  Fact Sheet for Healthcare Providers: seriousbroker.it  This test is not yet approved or cleared by the [place]  FDA and has  been authorized for detection and/or diagnosis of SARS-CoV-2 by FDA under an Emergency Use Authorization (EUA). This EUA will remain in effect (meaning this test can be used) for the duration of the COVID-19 declaration under Section 564(b)(1) of the Act, 21 U.S.C. section 360bbb-3(b)(1), unless the authorization is terminated or revoked.     Resp Syncytial Virus by PCR NEGATIVE NEGATIVE Final    Comment: (NOTE) Fact Sheet for Patients: bloggercourse.com  Fact Sheet for Healthcare Providers: seriousbroker.it  This test is not yet approved or cleared by the [place]  FDA and has been authorized for detection and/or diagnosis of SARS-CoV-2 by FDA under an Emergency Use Authorization (EUA). This EUA will remain in effect (meaning this test can be used) for the duration of the COVID-19 declaration under Section 564(b)(1) of the Act, 21 U.S.C. section 360bbb-3(b)(1), unless the authorization is terminated or revoked.  Performed at [facility], [address] [place], [place] [address]   Culture, blood (Routine X 2) w Reflex to ID Panel  Status: None (Preliminary result)   Collection Time: 06/03/23  1:30 AM   Specimen: BLOOD LEFT FOREARM  Result Value Ref Range Status   Specimen Description   Final    BLOOD LEFT FOREARM Performed at [facility], [address], [place], [place] [address]    Special Requests   Final    BOTTLES DRAWN AEROBIC AND ANAEROBIC Blood Culture results may not be optimal due to an inadequate volume of blood received in culture bottles Performed at [facility], [address], [place], [place] [address]    Culture   Final    NO GROWTH 4 DAYS Performed at [facility], [address], [place], [place] [address]    Report Status PENDING  Incomplete  Culture, blood (Routine X 2) w Reflex to ID Panel     Status: None (Preliminary result)    Collection Time: 06/03/23  1:39 AM   Specimen: BLOOD LEFT HAND  Result Value Ref Range Status   Specimen Description   Final    BLOOD LEFT HAND Performed at [facility], [address] [place], [place] [address]    Special Requests   Final    BOTTLES DRAWN AEROBIC AND ANAEROBIC Blood Culture adequate volume Performed at [facility], [address] [place], [place] [address]    Culture   Final    NO GROWTH 4 DAYS Performed at [facility], [address] [place], [place] [address]    Report Status PENDING  Incomplete  Expectorated Sputum Assessment w Gram Stain, Rflx to Resp Cult     Status: None   Collection Time: 06/03/23  4:48 AM   Specimen: Sputum  Result Value Ref Range Status   Specimen Description   Final    SPUTUM Performed at [facility], [address] [place], [place] [address]    Special Requests   Final    Normal Performed at [facility], [address], [place], [place] [address]    Sputum evaluation   Final    THIS SPECIMEN IS ACCEPTABLE FOR SPUTUM CULTURE Performed at [facility], [address] [place], [place] [address]    Report Status 06/03/2023 FINAL  Final  Culture, Respiratory w Gram Stain     Status: None   Collection Time: 06/03/23  4:48 AM   Specimen: SPU  Result Value Ref Range Status   Specimen Description SPUTUM  Final   Special Requests Normal Reflexed from [id number]  Final   Gram Stain   Final    MODERATE WBC PRESENT,BOTH PMN AND MONONUCLEAR FEW GRAM POSITIVE COCCI IN PAIRS FEW GRAM NEGATIVE RODS RARE YEAST WITH PSEUDOHYPHAE    Culture   Final    RARE STREPTOCOCCUS GROUP F Beta hemolytic streptococci are predictably susceptible to penicillin and other beta lactams. Susceptibility testing not routinely performed. WITHIN NORMAL RESPIRATORY FLORA Performed at [facility], [address] [place], [place] [address]    Report Status  06/06/2023 FINAL  Final  Body fluid culture w Gram Stain     Status: None   Collection Time: 06/03/23  4:35 PM   Specimen: Pleural Fluid  Result Value Ref Range Status   Specimen Description FLUID PLEURAL  Final   Special Requests NONE  Final   Gram Stain   Final    FEW WBC PRESENT,BOTH PMN AND MONONUCLEAR NO ORGANISMS SEEN    Culture   Final    NO GROWTH 3 DAYS Performed  at [facility], [address], [place], [place] [address]    Report Status 06/07/2023 FINAL  Final  MRSA Next Gen by PCR, Nasal     Status: None   Collection Time: 06/04/23 12:20 AM   Specimen: Nasal Mucosa; Nasal Swab  Result Value Ref Range Status   MRSA by PCR Next Gen NOT DETECTED NOT DETECTED Final    Comment: (NOTE) The GeneXpert MRSA Assay (FDA approved for NASAL specimens only), is one component of a comprehensive MRSA colonization surveillance program. It is not intended to diagnose MRSA infection nor to guide or monitor treatment for MRSA infections. Test performance is not FDA approved in patients less than 67 years old. Performed at [facility], [address] [place], [place] [address]   Culture, blood (Routine X 2) w Reflex to ID Panel     Status: None (Preliminary result)   Collection Time: 06/06/23  7:46 AM   Specimen: BLOOD RIGHT HAND  Result Value Ref Range Status   Specimen Description BLOOD RIGHT HAND  Final   Special Requests   Final    BOTTLES DRAWN AEROBIC AND ANAEROBIC Blood Culture results may not be optimal due to an inadequate volume of blood received in culture bottles   Culture   Final    NO GROWTH < 24 HOURS Performed at [facility], [address], [place], [place] [address]    Report Status PENDING  Incomplete  Culture, blood (Routine X 2) w Reflex to ID Panel     Status: None (Preliminary result)   Collection Time: 06/06/23  7:46 AM   Specimen: BLOOD LEFT ARM  Result Value Ref Range Status   Specimen Description BLOOD LEFT ARM  Final   Special  Requests   Final    BOTTLES DRAWN AEROBIC AND ANAEROBIC Blood Culture results may not be optimal due to an inadequate volume of blood received in culture bottles   Culture   Final    NO GROWTH < 24 HOURS Performed at [facility], [address], [place], [place] [address]    Report Status PENDING  Incomplete    I have personally spent 25 minutes involved in face-to-face and non-face-to-face activities for this patient on the day of the visit. Professional time spent includes the following activities: Preparing to see the patient (review of tests), Obtaining and/or reviewing separately obtained history (admission/discharge record), Performing a medically appropriate examination and/or evaluation , Ordering medications/tests/procedures, referring and communicating with other health care professionals, Documenting clinical information in the EMR, Independently interpreting results (not separately reported), Communicating results to the patient/family/caregiver, Counseling and educating the patient/family/caregiver and Care coordination (not separately reported).    Greg Shaheer Bonfield, NP [facility]  06/07/2023  11:46 AM

## 2023-06-07 NOTE — Evaluation (Signed)
 Occupational Therapy Evaluation Patient Details Name: Brent Bryant MRN: [id number] DOB: 1990/11/07 Today's Date: 06/07/2023   History of Present Illness Pt is a 33 y/o male presenting on 1/31 with 3 days of cough and chest pain. Work up revealed L pleural space and dense pneumonia. 2/1 s/p small bored chest tube.  Chest CT with large loculated left effusion.2/5 IR for guided chest tube placement. PMH includes: hepC, substance abuse.   Clinical Impression   PTA patient reports independent and working in aeronautical engineer.  He was admitted for above and presents with problem list below.  He is limited by pain in L flank, chest tube in place.  He is able to come into long sitting with supervision, scooting self up in bed with supervision.  Overall anticipate pt would need setup to mod assist for Adls. Anticipate he will progress well once pain improves.   Will follow acutely with recommendation for no OT follow up after dc home.       If plan is discharge home, recommend the following: A lot of help with bathing/dressing/bathroom;Assistance with cooking/housework    Functional Status Assessment  Patient has had a recent decline in their functional status and demonstrates the ability to make significant improvements in function in a reasonable and predictable amount of time.  Equipment Recommendations  Other (comment) (TBD)    Recommendations for Other Services       Precautions / Restrictions Precautions Precautions: Fall Precaution Comments: L chest tube Restrictions Weight Bearing Restrictions Per Provider Order: No      Mobility Bed Mobility Overal bed mobility: Needs Assistance             General bed mobility comments: transitioned to long sitting with supervision pulling on rails, able to boost hips up in bed with supervision but declined further mobility    Transfers                   General transfer comment: pt declined      Balance                                            ADL either performed or assessed with clinical judgement   ADL Overall ADL's : Needs assistance/impaired     Grooming: Set up;Bed level           Upper Body Dressing : Minimal assistance;Bed level   Lower Body Dressing: Moderate assistance;Bed level     Toilet Transfer Details (indicate cue type and reason): NT         Functional mobility during ADLs: Supervision/safety General ADL Comments: supervision to long sit in bed, pt declined further mobility     Vision   Vision Assessment?: No apparent visual deficits     Perception         Praxis         Pertinent Vitals/Pain Pain Assessment Pain Assessment: 0-10 Pain Score: 5  (was 12, recieved meds during session decreased to 5) Pain Location: L flank Pain Descriptors / Indicators: Discomfort, Grimacing Pain Intervention(s): Limited activity within patient's tolerance, Monitored during session, Premedicated before session, Repositioned     Extremity/Trunk Assessment Upper Extremity Assessment Upper Extremity Assessment: Overall WFL for tasks assessed   Lower Extremity Assessment Lower Extremity Assessment: Defer to PT evaluation       Communication Communication Communication: No apparent difficulties   Cognition Arousal: Alert  Behavior During Therapy: WFL for tasks assessed/performed Overall Cognitive Status: Within Functional Limits for tasks assessed                                 General Comments: appears 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: Decreased strength;Decreased activity tolerance;Pain;Cardiopulmonary status limiting activity      OT Treatment/Interventions: Self-care/ADL training;Energy conservation;DME and/or AE instruction;Therapeutic activities;Patient/family education    OT Goals(Current goals can be found in the care plan section) Acute Rehab OT Goals Patient Stated Goal: get better OT Goal Formulation: With patient Time For Goal Achievement: 06/21/23 Potential to Achieve Goals: Good  OT Frequency: Min 1X/week    Co-evaluation PT/OT/SLP Co-Evaluation/Treatment: Yes Reason for Co-Treatment: Other (comment) (tolerance, pain)   OT goals addressed during session: ADL's and self-care      AM-PAC OT 6 Clicks Daily Activity     Outcome Measure Help from another person eating meals?: Total (NPO) Help from another person taking care of personal grooming?: A Little Help from another person toileting, which includes using toliet, bedpan, or urinal?: A Little Help from another person bathing (including washing, rinsing, drying)?: A Lot Help from another person to put on and taking off regular upper body clothing?: A Little Help from another person to put on and taking off regular lower body clothing?: A Lot 6 Click Score: 14   End of Session Equipment Utilized During Treatment: Oxygen (2L) Nurse Communication: Mobility status;Other (comment) (pain)  Activity Tolerance: Patient limited by pain Patient left: in bed;with call bell/phone within reach;with bed alarm set  OT Visit Diagnosis: Other abnormalities of gait and mobility (R26.89);Pain Pain - Right/Left: Left Pain - part of body:  (flank)                Time: 8854-8841 OT Time Calculation (min): 13 min Charges:  OT General Charges $OT Visit: 1 Visit OT  Evaluation $OT Eval Moderate Complexity: 1 Mod  Etta NOVAK, OT Acute Rehabilitation Services Office [phone]   Etta GORMAN [place] 06/07/2023, 12:10 PM

## 2023-06-07 NOTE — Progress Notes (Signed)
   NAME:  Brent Bryant, MRN:  [id number], DOB:  Apr 13, 1991, LOS: 4 ADMISSION DATE:  06/02/2023, CONSULTATION DATE:  06/03/23 REFERRING MD:  Raenelle, CHIEF COMPLAINT:  flank pain   History of Present Illness:  33 year old man with hx of HepC, substance abuse presenting with 3 days of cough and chest pain.   Cough with brown sputum.  Chest pain is pleuritic.  Workup has revealed complex L pleural space and dense pneumonia.  PCCM consulted for assistance with management.  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     Pertinent  Medical History  HepC  Significant Hospital Events: Including procedures, antibiotic start and stop dates in addition to other pertinent events   2/1 admit, small bored chest tube placed  2/2 First dose pleural lytics, out of chest tune since insertion 2/3 2nd dose of lytics. After intrapleural lytics patient had severe pain associated with tachycardia and tachypnea.   Interim History / Subjective:  Left chest tube with minimal output overnight CT chest obtained with persistent large loculated effusion Objective   Blood pressure 109/71, pulse (!) 116, temperature 98.9 F (37.2 C), temperature source Oral, resp. rate (!) 21, height 5' 8 (1.727 m), weight 59.9 kg, SpO2 92%.        Intake/Output Summary (Last 24 hours) at 06/07/2023 0754 Last data filed at 06/07/2023 0000 Gross per 24 hour  Intake 190 ml  Output 730 ml  Net -540 ml   Filed Weights   06/03/23 2243 06/06/23 1400  Weight: 59 kg 59.9 kg   Physical Exam: General: Thin cachectic chronically ill-appearing, no acute distress HENT: [place], AT, OP clear, MMM Eyes: EOMI, no scleral icterus Respiratory: Diminished left air entry. No crackles, wheezing or rales Cardiovascular: Tachycardic, RR, -M/R/G, no JVD Extremities:-Edema,-tenderness Neuro: AAO x4, CNII-XII grossly intact    Resolved Hospital Problem list   N/A  Assessment & Plan:  Empyema Sepsis Hx polysubstance abuse S/p lytics 2/2  and 2/3. Hold today due to increased effusion size Chest CT demonstrates large loculated left effusion IR consulted for second chest tube NPO for procedure Continue chest tube -20 cm H20 Continue Unasyn  F/u cultures Routine chest tube care and flushes Pain control per primary team   Best Practice (right click and Reselect all SmartList Selections daily)  Per primary  Critical care time: N/A   Care Time: 35 min  Slater Staff, M.D. [facility] Pulmonary/Critical Care Medicine 06/07/2023 7:55 AM   See Amion for personal pager For hours between 7 PM to 7 AM, please call Elink for urgent questions

## 2023-06-07 NOTE — Procedures (Signed)
 Interventional Radiology Procedure Note  Procedure: CT guided left thoracostomy tube placement   Findings: Please refer to procedural dictation for full description.14 Fr pigtail, left midaxillary. To pleurevac.  Complications: None immediate  Estimated Blood Loss: < 5 mL  Recommendations: Keep to suction. Recommend CXR in am. IR will follow.   Ester Sides, MD

## 2023-06-07 NOTE — TOC CM/SW Note (Signed)
  [facility] [employer]ICATION [employer] CARD Pharmacies please call [phone] for claim processing assistance.  Rx BIN: [id number] Rx Group: [id number] Rx PCN: [id number] Relationship Code: 1 Person Code: [id number]  Patient ID (MRN):  MOSESMRN:  [id number]         Patient Name: Brent Bryant   Patient DOB: 07-18-1990   Discharge Date: 06/08/2023  Expiration Date:06/15/2023 (must be filled within 7 days of discharge)   Dear Rosine have been approved to have the prescriptions written by your discharging physician filled through our [facility] (Medication Assistance Through [facility]) program. This program allows for a one-time (no refills) 34-day supply of selected medications for a low copay amount.  The copay is $3.00 per prescription. For instance, if you have one prescription, you will pay $3.00; for two prescriptions, you pay $6.00; for three prescriptions, you pay $9.00; and so on. Only certain pharmacies are participating in this program with [facility]. You will need to select one of the pharmacies from the attached lists and take your prescriptions, this letter, and your photo ID to one of the participating pharmacies.  We are excited that you are able to use the [facility] program to get your medications. These prescriptions must be filled within 7 days of hospital discharge or they will no longer be valid for the [facility] program. Should you have any problems with your prescriptions please contact your case management team member at [phone] for [place] or [phone] for [facility].  Thank you, [facility]  [facility] Program Pharmacies [place]. [facility], [facility], [facility]  [facility] Pharmacies [address] [place], [place] [address] [place], [place] [address], [place] NOV[place], [employer] [address], [address], [place] Other [facility] [address] [place], [place] [facility] [address]  [facility] [id number], [place]

## 2023-06-07 NOTE — H&P (Signed)
 Chief Complaint: Left lower lube pnemonia with empyema - image guided chest tube placement   Referring Provider(s): Kassie, [place]   Supervising Physician: Jennefer Rover  Patient Status: [facility] - In-pt  History of Present Illness: Brent Bryant is a 33 y.o. male with a history of polysubstance abuse, chronic hepatitis C, and a half pack a day cigarette smoking history for the past 12 years.  He presented to the ED on 06/02/23 with severe left sided flank pain, body aches, nausea, and diarrhea. A CT abdomen and pelvis was obtained and revealed a loculated left pleural effusion with lower lobe consolidation concerning for pneumonia; right lower lobe consolidation concerning for pneumonia.  He was admitted on 06/03/23 and a chest tube was placed by PCCM.  Pleural cultures collected on 06/03/23 show no growth.  Interventional radiology was consulted for image guided chest tube placement.    Patient is Full Code  Past Medical History:  Diagnosis Date   Allergy    Hepatitis C     History reviewed. No pertinent surgical history.  Allergies: Tylenol  [acetaminophen ] and Vicodin [hydrocodone-acetaminophen ]  Medications: Prior to Admission medications   Medication Sig Start Date End Date Taking? Authorizing Provider  acetaminophen  (TYLENOL ) 500 MG tablet Take 1,000 mg by mouth every 6 (six) hours as needed for mild pain.   Yes [provider]  naproxen  (NAPROSYN ) 500 MG tablet Take 1 tablet (500 mg total) by mouth 2 (two) times daily with a meal. As needed for pain 09/04/20  Yes Randol Simmonds, MD     Family History  Problem Relation Age of Onset   Diabetes Mother    Healthy Father    Stroke Neg Hx    Cancer Neg Hx     Social History   Socioeconomic History   Marital status: Single    Spouse name: Not on file   Number of children: Not on file   Years of education: Not on file   Highest education level: Not on file  Occupational History   Not on file  Tobacco Use   Smoking  status: Every Day    Current packs/day: 0.50    Average packs/day: 0.5 packs/day for 12.0 years (6.0 ttl pk-yrs)    Types: Cigarettes   Smokeless tobacco: Never  Vaping Use   Vaping status: Never Used  Substance and Sexual Activity   Alcohol use: No   Drug use: Yes    Types: Marijuana   Sexual activity: Not on file  Other Topics Concern   Not on file  Social History Narrative   Not on file   Social Drivers of Health   Financial Resource Strain: Not on file  Food Insecurity: Patient Unable To Answer (06/04/2023)   Hunger Vital Sign    Worried About Running Out of Food in the Last Year: Patient unable to answer    Ran Out of Food in the Last Year: Patient unable to answer  Transportation Needs: Patient Unable To Answer (06/04/2023)   PRAPARE - Transportation    Lack of Transportation (Medical): Patient unable to answer    Lack of Transportation (Non-Medical): Patient unable to answer  Physical Activity: Not on file  Stress: Not on file  Social Connections: Unknown (09/13/2021)   Received from [facility]   Social Network    Social Network: Not on file     Review of Systems: A 12 point ROS discussed and pertinent positives are indicated in the HPI above.  All other systems are negative.  Review of Systems  Constitutional:  Negative for fatigue and fever.  HENT:  Positive for dental problem. Negative for congestion.   Respiratory:  Positive for shortness of breath.        Left lung sounds bubbly/gurgly/fluid filled Right lung normal   Cardiovascular:  Negative for chest pain.  Gastrointestinal:  Negative for abdominal pain, diarrhea, nausea and vomiting.  Skin:        Chest tube site clean and dry  No sign of infection  Pleur-evac 150 cc output   Neurological:  Negative for dizziness and headaches.  Psychiatric/Behavioral:  Negative for agitation, behavioral problems and confusion.     Vital Signs: BP 124/84   Pulse (!) 112   Temp 98.6 F (37 C) (Oral)   Resp 20    Ht 5' 8 (1.727 m)   Wt 132 lb 0.9 oz (59.9 kg)   SpO2 94%   BMI 20.08 kg/m   Advance Care Plan: The advanced care place/surrogate decision maker was discussed at the time of visit and the patient did not wish to discuss or was not able to name a surrogate decision maker or provide an advance care plan.  Physical Exam Constitutional:      Appearance: He is ill-appearing.  HENT:     Head: Normocephalic and atraumatic.     Mouth/Throat:     Mouth: Mucous membranes are moist.  Cardiovascular:     Rate and Rhythm: Regular rhythm. Tachycardia present.     Pulses: Normal pulses.  Pulmonary:     Comments: Chest tube placed by PCCM 06/03/23 Site clean and dry  No signs of infection  Pleur-evac output 150 cc  Abdominal:     General: Bowel sounds are normal.     Palpations: Abdomen is soft.  Musculoskeletal:        General: Normal range of motion.     Cervical back: Normal range of motion.  Skin:    General: Skin is warm and dry.  Neurological:     Mental Status: He is alert and oriented to person, place, and time.  Psychiatric:        Mood and Affect: Mood normal.        Behavior: Behavior normal.     Imaging: CT MAXILLOFACIAL W CONTRAST Result Date: 06/06/2023 CLINICAL DATA:  Dental caries EXAM: CT MAXILLOFACIAL WITH CONTRAST TECHNIQUE: Multidetector CT imaging of the maxillofacial structures was performed with intravenous contrast. Multiplanar CT image reconstructions were also generated. RADIATION DOSE REDUCTION: This exam was performed according to the departmental dose-optimization program which includes automated exposure control, adjustment of the mA and/or kV according to patient size and/or use of iterative reconstruction technique. CONTRAST:  75mL OMNIPAQUE  IOHEXOL  350 MG/ML SOLN COMPARISON:  None Available. FINDINGS: Osseous: No traumatic facial bone finding. Dental: Unerupted right maxillary tooth 1. Multiple caries evident affecting the anterior maxillary dentition from  tooth 4 through tooth 12. Lucency around the roots particularly at teeth 8, 9 and 10. With respect to the mandibular dentition, there is advanced decay with multiple caries. Unfortunately there is a good bit of motion degradation. Pronounced lucency adjacent to the roots of the left mandibular dentition with a confluent root abscess in the right mandible affecting teeth 30 and 31. Orbits: Normal Sinuses: Clear Soft tissues: Negative Limited intracranial: Normal IMPRESSION: 1. Advanced dental and periodontal disease as outlined above. Electronically Signed   By: Oneil Officer M.D.   On: 06/06/2023 22:39   CT CHEST WO CONTRAST Result Date: 06/06/2023 CLINICAL DATA:  Pneumonia complication EXAM: CT CHEST WITHOUT CONTRAST TECHNIQUE: Multidetector CT imaging of the chest was performed following the standard protocol without IV contrast. RADIATION DOSE REDUCTION: This exam was performed according to the departmental dose-optimization program which includes automated exposure control, adjustment of the mA and/or kV according to patient size and/or use of iterative reconstruction technique. COMPARISON:  CT abdomen 06/02/2023 FINDINGS: Cardiovascular: Heart size normal. Blood pool is hypodense compared to the interventricular septum suggesting anemia. Trace pericardial fluid. Central great vessels unremarkable. Mediastinum/Nodes: Anterior mediastinal soft tissue density presumably residual thymic tissue. No definite mass or adenopathy, sensitivity decreased without contrast. Lungs/Pleura: Progressive moderate partially loculated left pleural effusion despite placement of pigtail chest tube anteromedially at the lung base. Scattered loculated pleural gas bubbles may be related to chest tube placement versus infection. Persistent dense consolidation of the left lower lobe. Worsening dependent consolidation/atelectasis in the lingula and left upper lobe. Small right pleural effusion has developed. Consolidation with air  bronchograms posteriorly in the right lower lobe. Subsegmental consolidation/atelectasis anteromedially in the right upper lobe. Upper Abdomen: No acute findings. Musculoskeletal: No chest wall mass or suspicious bone lesions identified. IMPRESSION: 1. Progressive moderate partially loculated left pleural effusion despite placement of pigtail chest tube. 2. Worsening dependent consolidation/atelectasis in the lingula and left upper lobe. 3. Persistent dense consolidation of the left lower lobe. 4. New small right pleural effusion. 5. New consolidation/atelectasis posteriorly in the right lower lobe. Electronically Signed   By: JONETTA Faes M.D.   On: 06/06/2023 16:52   DG Chest Port 1V same Day Result Date: 06/06/2023 CLINICAL DATA:  141880 SOB (shortness of breath) [id number] EXAM: PORTABLE CHEST 1 VIEW COMPARISON:  Chest x-ray 06/05/2023, CT abdomen pelvis 06/02/2023 FINDINGS: Left chest tube with pigtail overlying the lower medial left hemithorax. The heart and mediastinal contours are within normal limits. Left lung airspace opacity. No pulmonary edema. Interval increase in size of a loculated moderate to large volume left pleural effusion. No right pleural effusion. No pneumothorax. No acute osseous abnormality. IMPRESSION: 1. Interval increase in size of a loculated moderate to large volume left pleural effusion. 2. Underlying left lung airspace opacity may represent combination of infection/inflammation and atelectasis. 3. Left chest tube stable. Electronically Signed   By: Morgane  Naveau M.D.   On: 06/06/2023 07:59   DG CHEST PORT 1 VIEW Result Date: 06/05/2023 CLINICAL DATA:  Pain EXAM: PORTABLE CHEST 1 VIEW COMPARISON:  Chest x-ray 06/05/2023 FINDINGS: Left-sided chest tube is unchanged in position. Left pleural effusion which appears partially loculated is unchanged from prior. There is no pneumothorax. There is a new band of opacity in the right suprahilar region. Patchy opacities in the left lower lung  have increased. Cardiomediastinal silhouette is within normal limits. No acute fractures are seen. IMPRESSION: 1. Left-sided chest tube is unchanged in position. 2. Stable partially loculated left pleural effusion. 3. New band of opacity in the right suprahilar region. 4. Increasing left basilar air atelectasis/airspace disease. Electronically Signed   By: Greig Pique M.D.   On: 06/05/2023 18:30   DG CHEST PORT 1 VIEW Result Date: 06/05/2023 CLINICAL DATA:  Chest tube in place. EXAM: PORTABLE CHEST 1 VIEW COMPARISON:  Radiographs 06/04/2023 and 06/03/2023. Abdominal CT 06/02/2023. FINDINGS: 0527 hours. Left pigtail chest tube is unchanged in position, overlying the medial left lung base. Lobulated left pleural effusion appears mildly decreased in volume, and there is improved aeration of the left lung. The right lung is clear. No evidence of pneumothorax. The visualized heart size and  mediastinal contours are stable. IMPRESSION: Mild decrease in volume of left pleural effusion with improved aeration of the left lung. No evidence of pneumothorax. Electronically Signed   By: Elsie Perone M.D.   On: 06/05/2023 10:12   ECHOCARDIOGRAM COMPLETE Result Date: 06/04/2023    ECHOCARDIOGRAM REPORT   Patient Name:   Lorne Winkels Date of Exam: 06/04/2023 Medical Rec #:  [id number]    Height:       68.0 in Accession #:    [id number]   Weight:       130.1 lb Date of Birth:  [birth year]    BSA:          1.702 m Patient Age:    32 years     BP:           119/74 mmHg Patient Gender: M            HR:           111 bpm. Exam Location:  Inpatient Procedure: 2D Echo, Cardiac Doppler and Color Doppler Indications:    Endocarditis  History:        Patient has no prior history of Echocardiogram examinations.  Sonographer:    Amy Chionchio Referring Phys: TORIBIO JAYSON SHARPS IMPRESSIONS  1. Left ventricular ejection fraction, by estimation, is 65 to 70%. Left ventricular ejection fraction by 2D MOD biplane is 68.5 %. The left ventricle has  normal function. The left ventricle has no regional wall motion abnormalities. Left ventricular diastolic parameters were normal.  2. Right ventricular systolic function is normal. The right ventricular size is normal. Tricuspid regurgitation signal is inadequate for assessing PA pressure.  3. The mitral valve is grossly normal. No evidence of mitral valve regurgitation.  4. The aortic valve was not well visualized. Aortic valve regurgitation is not visualized.  5. The inferior vena cava is normal in size with <50% respiratory variability, suggesting right atrial pressure of 8 mmHg. Comparison(s): No prior Echocardiogram. Conclusion(s)/Recommendation(s): No evidence of valvular vegetations on this transthoracic echocardiogram. Consider a transesophageal echocardiogram to exclude infective endocarditis if clinically indicated. FINDINGS  Left Ventricle: Left ventricular ejection fraction, by estimation, is 65 to 70%. Left ventricular ejection fraction by 2D MOD biplane is 68.5 %. The left ventricle has normal function. The left ventricle has no regional wall motion abnormalities. The left ventricular internal cavity size was normal in size. There is no left ventricular hypertrophy. Left ventricular diastolic parameters were normal. Right Ventricle: The right ventricular size is normal. No increase in right ventricular wall thickness. Right ventricular systolic function is normal. Tricuspid regurgitation signal is inadequate for assessing PA pressure. Left Atrium: Left atrial size was normal in size. Right Atrium: Right atrial size was normal in size. Pericardium: There is no evidence of pericardial effusion. Mitral Valve: The mitral valve is grossly normal. No evidence of mitral valve regurgitation. MV peak gradient, 2.2 mmHg. The mean mitral valve gradient is 1.0 mmHg. Tricuspid Valve: The tricuspid valve is normal in structure. Tricuspid valve regurgitation is not demonstrated. Aortic Valve: The aortic valve was not  well visualized. Aortic valve regurgitation is not visualized. Aortic valve mean gradient measures 5.0 mmHg. Aortic valve peak gradient measures 10.8 mmHg. Aortic valve area, by VTI measures 2.26 cm. Pulmonic Valve: The pulmonic valve was normal in structure. Pulmonic valve regurgitation is not visualized. Aorta: The aortic root and ascending aorta are structurally normal, with no evidence of dilitation. Venous: The inferior vena cava is normal in size with less than 50% respiratory variability,  suggesting right atrial pressure of 8 mmHg. IAS/Shunts: No atrial level shunt detected by color flow Doppler.  LEFT VENTRICLE PLAX 2D                        Biplane EF (MOD) LVIDd:         4.60 cm         LV Biplane EF:   Left LVIDs:         3.60 cm                          ventricular LV PW:         0.80 cm                          ejection LV IVS:        0.60 cm                          fraction by LVOT diam:     2.10 cm                          2D MOD LV SV:         46                               biplane is LV SV Index:   27                               68.5 %. LVOT Area:     3.46 cm                                Diastology                                LV e' medial:    11.20 cm/s LV Volumes (MOD)               LV E/e' medial:  7.7 LV vol d, MOD    101.0 ml      LV e' lateral:   17.00 cm/s A2C:                           LV E/e' lateral: 5.1 LV vol d, MOD    126.0 ml A4C: LV vol s, MOD    25.3 ml A2C: LV vol s, MOD    50.4 ml A4C: LV SV MOD A2C:   75.7 ml LV SV MOD A4C:   126.0 ml LV SV MOD BP:    87.9 ml RIGHT VENTRICLE          IVC RV Basal diam:  2.70 cm  IVC diam: 1.50 cm TAPSE (M-mode): 2.3 cm LEFT ATRIUM             Index        RIGHT ATRIUM           Index LA Vol (A2C):   50.3 ml 29.55 ml/m  RA Area:     14.30 cm LA Vol (A4C):   44.7 ml 26.26 ml/m  RA Volume:   36.70 ml  21.56 ml/m LA Biplane Vol: 47.9 ml 28.14 ml/m  AORTIC VALVE                     PULMONIC VALVE AV Area (Vmax):    2.08 cm      PV  Vmax:       0.96 m/s AV Area (Vmean):   2.31 cm      PV Peak grad:  3.6 mmHg AV Area (VTI):     2.26 cm AV Vmax:           164.00 cm/s AV Vmean:          107.000 cm/s AV VTI:            0.204 m AV Peak Grad:      10.8 mmHg AV Mean Grad:      5.0 mmHg LVOT Vmax:         98.40 cm/s LVOT Vmean:        71.300 cm/s LVOT VTI:          0.133 m LVOT/AV VTI ratio: 0.65  AORTA Ao Root diam: 3.30 cm Ao Asc diam:  2.90 cm MITRAL VALVE MV Area (PHT): 5.46 cm    SHUNTS MV Area VTI:   2.99 cm    Systemic VTI:  0.13 m MV Peak grad:  2.2 mmHg    Systemic Diam: 2.10 cm MV Mean grad:  1.0 mmHg MV Vmax:       0.75 m/s MV Vmean:      59.1 cm/s MV Decel Time: 139 msec MV E velocity: 86.40 cm/s MV A velocity: 71.10 cm/s MV E/A ratio:  1.22 Vinie Maxcy MD Electronically signed by Vinie Maxcy MD Signature Date/Time: 06/04/2023/1:37:10 PM    Final    DG Chest Port 1 View Result Date: 06/04/2023 CLINICAL DATA:  Chest tube in place EXAM: PORTABLE CHEST 1 VIEW COMPARISON:  Yesterday FINDINGS: Numerous leads and wires project over the chest. Minimal tracheal deviation to the right. Mild cardiomegaly. A left-sided pleural pigtail catheter is unchanged in position inferiorly. No significant change in moderate left pleural effusion with extensive loculation superiorly and laterally. Relatively diffuse left-sided airspace disease is also similar. The right lung remains clear. IMPRESSION: Relatively similar appearance of loculated left-sided pleural fluid and diffuse left-sided airspace disease. Left-sided pleural catheter remaining in place. Electronically Signed   By: Rockey Kilts M.D.   On: 06/04/2023 09:34   DG Chest Port 1 View Result Date: 06/03/2023 CLINICAL DATA:  Chest tube in place. EXAM: PORTABLE CHEST 1 VIEW COMPARISON:  Same day. FINDINGS: Stable cardiomediastinal silhouette. Right lung is clear. Interval placement of pleural drainage catheter in the left lung base. Left pleural effusion is significantly enlarged compared to  prior exam with left upper lobe atelectasis or infiltrate. IMPRESSION: Interval placement of pleural drainage catheter into the left lung base. Left pleural effusion is significantly enlarged compared to prior exam with left upper lobe atelectasis or infiltrate. Electronically Signed   By: Lynwood Landy Raddle M.D.   On: 06/03/2023 16:59   DG Chest 2 View Result Date: 06/03/2023 CLINICAL DATA:  Cough EXAM: CHEST - 2 VIEW COMPARISON:  04/10/2020 FINDINGS: Loculated left pleural effusion with left lower lobe consolidation. Mild vascular congestion of the right lung. Normal cardiomediastinal contours. IMPRESSION: Loculated left pleural effusion with left lower lobe consolidation, concerning for pneumonia. Electronically Signed   By: Franky Stanford M.D.   On: 06/03/2023 02:20   CT  ABDOMEN PELVIS W CONTRAST Result Date: 06/02/2023 CLINICAL DATA:  Left flank pain EXAM: CT ABDOMEN AND PELVIS WITH CONTRAST TECHNIQUE: Multidetector CT imaging of the abdomen and pelvis was performed using the standard protocol following bolus administration of intravenous contrast. RADIATION DOSE REDUCTION: This exam was performed according to the departmental dose-optimization program which includes automated exposure control, adjustment of the mA and/or kV according to patient size and/or use of iterative reconstruction technique. CONTRAST:  OMNIPAQUE  IOHEXOL  300 MG/ML  SOLN COMPARISON:  09/25/2018 FINDINGS: Lower Chest: Loculated left pleural effusion with left lower lobe consolidation. Small area of right middle lobe consolidation anteriorly. Hepatobiliary: Normal hepatic contours. No intra- or extrahepatic biliary dilatation. The gallbladder is normal. Pancreas: Normal pancreas. No ductal dilatation or peripancreatic fluid collection. Spleen: Normal. Adrenals/Urinary Tract: The adrenal glands are normal. No hydronephrosis, nephroureterolithiasis or solid renal mass. The urinary bladder is normal for degree of distention  Stomach/Bowel: There is no hiatal hernia. Normal duodenal course and caliber. No small bowel dilatation or inflammation. No focal colonic abnormality. Normal appendix. Vascular/Lymphatic: Normal course and caliber of the major abdominal vessels. No abdominal or pelvic lymphadenopathy. Reproductive: Normal prostate size with symmetric seminal vesicles. Other: None. Musculoskeletal: No bony spinal canal stenosis or focal osseous abnormality. IMPRESSION: 1. Loculated left pleural effusion with left lower lobe consolidation, concerning for post-obstructive pneumonia. 2. Small area of right middle lobe consolidation anteriorly, also concerning for pneumonia. 3. No acute abnormality of the abdomen or pelvis. Electronically Signed   By: Franky Stanford M.D.   On: 06/02/2023 23:53    Labs:  CBC: Recent Labs    06/04/23 0803 06/05/23 0211 06/05/23 2005 06/06/23 0435 06/07/23 0445  WBC 23.9* 32.5*  --  29.2* 17.6*  HGB 10.6* 10.9* 11.1* 10.6* 9.6*  HCT 29.5* 31.5* 31.2* 31.2* 28.2*  PLT 385 411*  --  514* 513*    COAGS: Recent Labs    06/03/23 1603  INR 1.2    BMP: Recent Labs    06/04/23 0803 06/05/23 0211 06/06/23 0435 06/07/23 0445  NA 131* 130* 131* 128*  K 3.6 4.3 4.4 4.1  CL 104 102 102 97*  CO2 18* 21* 22 24  GLUCOSE 147* 97 97 111*  BUN 17 15 12 6   CALCIUM 7.6* 7.3* 7.1* 7.4*  CREATININE 0.77 0.58* 0.70 0.47*  GFRNONAA >60 >60 >60 >60    LIVER FUNCTION TESTS: Recent Labs    06/03/23 1603 06/04/23 0803 06/05/23 0211 06/06/23 0435 06/07/23 0445  BILITOT 1.1 1.1  --  0.5 0.3  AST 16 15  --  17 18  ALT 12 10  --  11 11  ALKPHOS 163* 128*  --  74 72  PROT 5.5* 5.1*  --  4.4* 4.7*  ALBUMIN <1.5* <1.5* <1.5* <1.5* <1.5*    TUMOR MARKERS: No results for input(s): AFPTM, CEA, CA199, CHROMGRNA in the last 8760 hours.  Assessment and Plan:  Pt in obvious left sided chest pain, reports no other sites of discomfort.    This patient was reviewed by Dr. Jennefer  and approved for image guided chest tube placement, possibly multiple.    Risks and benefits of chest tube placement were discussed with the patient including bleeding, infection, damage to adjacent structures, malfunction of the tube requiring additional procedures and sepsis.  All of the patient's questions were answered, patient is agreeable to proceed. Consent signed and in chart.   Thank you for allowing our service to participate in Makiah Yom 's care.  Electronically  Signed: Lavanda JAYSON Jurist, PA-C   06/07/2023, 8:46 AM    I spent a total of 20 Minutes    in face to face in clinical consultation, greater than 50% of which was counseling/coordinating care for image guided chest tube(s) placement.

## 2023-06-07 NOTE — Progress Notes (Signed)
   06/07/23 1510  TOC Brief Assessment  Insurance and Status Lapsed (Medicaid Pending)  Patient has primary care physician Yes Dwight, [name], [name])  Home environment has been reviewed from home  Prior level of function: independent  Prior/Current Home Services No current home services  Social Drivers of Health Review SDOH reviewed interventions complete (smoking cessation attached)  Readmission risk has been reviewed Yes  Transition of care needs transition of care needs identified, TOC will continue to follow [facility])   MATCH letter processed for patient  medicaid pending  TOC will continue to follow patient for any additional discharge needs

## 2023-06-07 NOTE — Evaluation (Signed)
 Physical Therapy Evaluation Patient Details Name: Brent Bryant MRN: [id number] DOB: [birth year] Today's Date: 06/07/2023  History of Present Illness  Pt is a 33 y/o male presenting on 1/31 with 3 days of cough and chest pain. Work up revealed L pleural space and dense pneumonia. 2/1 s/p small bored chest tube.  Chest CT with large loculated left effusion.2/5 IR for guided chest tube placement. PMH includes: hepC, substance abuse.  Clinical Impression  Pt presents with admitting diagnosis above. Co-treat with OT. Session today was limited as pt refused OOB mobility due to pain. Pt was able to scoot himself up in bed with supervision. 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 DC recs TBD pending formal OOB assessment. PT will continue to follow.          If plan is discharge home, recommend the following:     Can travel by private vehicle        Equipment Recommendations Other (comment) (TBD)  Recommendations for Other Services       Functional Status Assessment Patient has had a recent decline in their functional status and demonstrates the ability to make significant improvements in function in a reasonable and predictable amount of time.     Precautions / Restrictions Precautions Precautions: Fall Precaution Comments: L chest tube Restrictions Weight Bearing Restrictions Per Provider Order: No      Mobility  Bed Mobility Overal bed mobility: Needs Assistance             General bed mobility comments: transitioned to long sitting with supervision pulling on rails, able to boost hips up in bed with supervision but declined further mobility    Transfers                   General transfer comment: pt declined    Ambulation/Gait               General Gait Details: Declined  Stairs            Wheelchair Mobility     Tilt Bed    Modified Rankin (Stroke Patients Only)       Balance                                              Pertinent Vitals/Pain Pain Assessment Pain Assessment: 0-10 Pain Score: 5  Pain Location: L flank Pain Descriptors / Indicators: Discomfort, Grimacing Pain Intervention(s): Limited activity within patient's tolerance, Monitored during session, RN gave pain meds during session    Home Living Family/patient expects to be discharged to:: Private residence Living Arrangements: Parent Available Help at Discharge: Family;Available 24 hours/day Type of Home: Apartment Home Access: Level entry       Home Layout: One level Home Equipment: None      Prior Function Prior Level of Function : Independent/Modified Independent;Working/employed;Driving               ADLs Comments: works in [employer] Extremity Assessment Upper Extremity Assessment: Overall WFL for tasks assessed    Lower Extremity Assessment Lower Extremity Assessment: Overall WFL for tasks assessed       Communication   Communication Communication: No apparent difficulties  Cognition Arousal: Alert Behavior During Therapy: WFL for tasks assessed/performed Overall Cognitive Status: Within Functional Limits for tasks assessed  General Comments: appears [facility]        General Comments General comments (skin integrity, edema, etc.): VSS    Exercises     Assessment/Plan    PT Assessment Patient needs continued PT services  PT Problem List Decreased strength;Decreased range of motion;Decreased activity tolerance;Decreased balance;Decreased mobility;Decreased coordination;Decreased knowledge of use of DME;Decreased safety awareness;Decreased knowledge of precautions;Cardiopulmonary status limiting activity       PT Treatment Interventions DME instruction;Gait training;Stair training;Functional mobility training;Therapeutic activities;Therapeutic exercise;Balance training;Neuromuscular  re-education;Patient/family education    PT Goals (Current goals can be found in the Care Plan section)  Acute Rehab PT Goals Patient Stated Goal: to go home PT Goal Formulation: With patient Time For Goal Achievement: 06/21/23 Potential to Achieve Goals: Good    Frequency Min 1X/week     Co-evaluation PT/OT/SLP Co-Evaluation/Treatment: Yes Reason for Co-Treatment: Other (comment) (tolerance, pain) PT goals addressed during session: Mobility/safety with mobility;Proper use of DME OT goals addressed during session: ADL's and self-care       AM-PAC PT 6 Clicks Mobility  Outcome Measure Help needed turning from your back to your side while in a flat bed without using bedrails?: A Little Help needed moving from lying on your back to sitting on the side of a flat bed without using bedrails?: A Little Help needed moving to and from a bed to a chair (including a wheelchair)?: A Little Help needed standing up from a chair using your arms (e.g., wheelchair or bedside chair)?: A Little Help needed to walk in hospital room?: A Little Help needed climbing 3-5 steps with a railing? : A Little 6 Click Score: 18    End of Session   Activity Tolerance: Patient limited by pain Patient left: in bed;with call bell/phone within reach;with nursing/sitter in room Nurse Communication: Mobility status PT Visit Diagnosis: Other abnormalities of gait and mobility (R26.89)    Time: 8854-8841 PT Time Calculation (min) (ACUTE ONLY): 13 min   Charges:   PT Evaluation $PT Eval Moderate Complexity: 1 Mod   PT General Charges $$ ACUTE PT VISIT: 1 Visit         Savayah Waltrip B, PT, DPT Acute Rehab Services [phone]   Fallon Howerter 06/07/2023, 2:12 PM

## 2023-06-07 NOTE — Progress Notes (Signed)
 Pharmacy Antibiotic Note  Brent Bryant is a 33 y.o. male admitted on 06/02/2023 with pneumonia.  Pharmacy has been consulted for vancomycin  and unasyn  dosing.   Pt is on D5 unasyn  for PNA/empyema. Plan for chest tube by IR. Scr stable   Plan: Continue Unasyn  3g IV q6h  Height: 5' 8 (172.7 cm) Weight: 59.9 kg (132 lb 0.9 oz) IBW/kg (Calculated) : 68.4  Temp (24hrs), Avg:98.5 F (36.9 C), Min:97.5 F (36.4 C), Max:98.9 F (37.2 C)  Recent Labs  Lab 06/03/23 1603 06/04/23 0803 06/05/23 0211 06/06/23 0435 06/07/23 0445  WBC 31.2* 23.9* 32.5* 29.2* 17.6*  CREATININE 0.67 0.77 0.58* 0.70 0.47*    Estimated Creatinine Clearance: 110.2 mL/min (A) (by C-G formula based on SCr of 0.47 mg/dL (L)).    Allergies  Allergen Reactions   Tylenol  [Acetaminophen ] Rash   Vicodin [Hydrocodone-Acetaminophen ] Rash    Antimicrobials this admission: Vanc 2/1 >> 2/3  Unasyn  2/1 >> Azithro IV 2/1 >> 2/3  Dose adjustments this admission:  Microbiology results: 2/1 ARk:whui 2/1 Sputum: rare Strep Group F, sens beta-lactams 2/1 Pleural: ngF 2/2 MRSA PCR: neg 2/4 blood>>ngtd  Sergio Batch, PharmD, BCIDP, AAHIVP, CPP Infectious Disease Pharmacist 06/07/2023 10:40 AM

## 2023-06-07 NOTE — Progress Notes (Addendum)
 PROGRESS NOTE        PATIENT DETAILS Name: Brent Bryant Age: 33 y.o. Sex: male Date of Birth: [birth year] Admit Date: 06/02/2023 Admitting Physician [name], MD ERE:[name], [name], [name]  Brief Summary: Patient is a 33 y.o.  male with history polysubstance abuse, chronic HCV-who presented with severe left-sided chest pain/flank pain-found to have sepsis secondary to left lower lobe pneumonia with empyema  Significant events: 2/01>> admit to [facility] consult-chest tube placed (pleural fluid-LDH 1174, WBC 7700) 2/2>> intrapleural lytics 2/3>> intrapleural lytics 2/4>> CT chest: Worsening left pleural effusion 2/5>> IR consult for second chest tube  Significant studies: 1/31>> CT abdomen/pelvis: Loculated pleural effusion-hide left lower PNA. 2/02>> TTE: EF 65-70% 2/04>> CT chest: Worsening loculated left pleural effusion despite placement of pigtail 2/04>> CT maxillofacial: Advanced dental/periodontal disease.  Significant microbiology data: 1/31>> COVID/influenza/RSV PCR: Negative 2/01>> pleural fluid culture: No growth 2/01>> sputum culture: Negative 2/01>> blood culture: No growth 2/04>> blood culture: No growth  Procedures: 2/01>> chest tube placement by PCCM  Consults: PCCM ID  Subjective: Unchanged-comfortable-not in any distress-continues to have pain in his left side of his chest.  Objective: Vitals: Blood pressure 116/77, pulse (!) 111, temperature 98.7 F (37.1 C), temperature source Oral, resp. rate (!) 30, height 5' 8 (1.727 m), weight 59.9 kg, SpO2 93%.   Exam: Gen Exam:Alert awake-not in any distress HEENT:atraumatic, normocephalic Chest: Moving air well except diminished air entry on the left-some bibasilar rales. CVS:S1S2 regular Abdomen:soft non tender, non distended Extremities:no edema Neurology: Non focal Skin: no rash  Pertinent Labs/Radiology:    Latest Ref Rng & Units 06/07/2023    4:45 AM 06/06/2023     4:35 AM 06/05/2023    8:05 PM  CBC  WBC 4.0 - 10.5 K/uL 17.6  29.2    Hemoglobin 13.0 - 17.0 g/dL 9.6  89.3  88.8   Hematocrit 39.0 - 52.0 % 28.2  31.2  31.2   Platelets 150 - 400 K/uL 513  514      Lab Results  Component Value Date   NA 128 (L) 06/07/2023   K 4.1 06/07/2023   CL 97 (L) 06/07/2023   CO2 24 06/07/2023    Assessment/Plan: Sepsis secondary to left lower lobe PNA with empyema-s/p chest tube placement by PCCM on 2/1 Sepsis physiology slowly improving-continues to have persistent leukocytosis CT chest on 2/4 with enlarging left pleural effusion-IR consulted by PCCM for second chest tube to be placed in the upper part of the left lung PCCM placed initial chest tube-and is s/p intrapleural lytics on 2/2 and Remains on Unasyn  PCCM/ID following.    Dental caries/poor oral hygiene Outpatient dentistry follow-up-no inpatient dentist coverage at [facility].  Chronic HCV Viral load pending  Normocytic anemia Secondary to acute illness Follow CBC  Hyponatremia Mild Asymptomatic Repeat electrolytes tomorrow  Polysubstance abuse-UDS positive for opiates/cocaine/THC Watch for withdrawal symptoms.  Underweight: Cachectic/emaciated-will get nutrition evaluation HIV negative TSH stable  Nutrition Status: Nutrition Problem: Severe Malnutrition Etiology: social / environmental circumstances Signs/Symptoms: severe muscle depletion, severe fat depletion Interventions: MVI, Hormel Shake    Estimated body mass index is 20.08 kg/m as calculated from the following:   Height as of this encounter: 5' 8 (1.727 m).   Weight as of this encounter: 59.9 kg.   Code status:   Code Status: Full Code   DVT Prophylaxis: Place and maintain sequential  compression device Start: 06/05/23 2016   Family Communication: None at bedside   Disposition Plan: Status is: Inpatient Remains inpatient appropriate because:    Planned Discharge Destination:Home   Diet: Diet Order              Diet NPO time specified  Diet effective now                     Antimicrobial agents: Anti-infectives (From admission, onward)    Start     Dose/Rate Route Frequency Ordered Stop   06/03/23 2200  azithromycin  (ZITHROMAX ) 500 mg in sodium chloride  0.9 % 250 mL IVPB        500 mg 250 mL/hr over 60 Minutes Intravenous Every 24 hours 06/03/23 1529 06/05/23 1515   06/03/23 1730  vancomycin  (VANCOCIN ) IVPB 1000 mg/200 mL premix  Status:  Discontinued        1,000 mg 200 mL/hr over 60 Minutes Intravenous Every 12 hours 06/03/23 1541 06/05/23 1407   06/03/23 1630  Ampicillin -Sulbactam (UNASYN ) 3 g in sodium chloride  0.9 % 100 mL IVPB        3 g 200 mL/hr over 30 Minutes Intravenous Every 6 hours 06/03/23 1541     06/03/23 0000  azithromycin  (ZITHROMAX ) 500 mg in sodium chloride  0.9 % 250 mL IVPB        500 mg 250 mL/hr over 60 Minutes Intravenous  Once 06/02/23 2351 06/03/23 0143   06/02/23 2345  cefTRIAXone  (ROCEPHIN ) 1 g in sodium chloride  0.9 % 100 mL IVPB        1 g 200 mL/hr over 30 Minutes Intravenous  Once 06/02/23 2342 06/03/23 0024   06/02/23 2345  metroNIDAZOLE  (FLAGYL ) IVPB 500 mg  Status:  Discontinued        500 mg 100 mL/hr over 60 Minutes Intravenous  Once 06/02/23 2342 06/02/23 2350        MEDICATIONS: Scheduled Meds:  feeding supplement  237 mL Oral BID BM   guaiFENesin   600 mg Oral BID   lidocaine   1 patch Transdermal Q24H   multivitamin with minerals  1 tablet Oral Daily   sodium chloride  flush  10 mL Intrapleural Q8H   Continuous Infusions:  ampicillin -sulbactam (UNASYN ) IV 3 g (06/07/23 1124)   PRN Meds:.acetaminophen  **OR** acetaminophen , levalbuterol , LORazepam , metoprolol  tartrate, morphine  injection, ondansetron  **OR** ondansetron  (ZOFRAN ) IV, oxyCODONE    I have personally reviewed following labs and imaging studies  LABORATORY DATA: CBC: Recent Labs  Lab 06/02/23 1904 06/03/23 0223 06/03/23 1603 06/04/23 0803 06/05/23 0211  06/05/23 2005 06/06/23 0435 06/07/23 0445  WBC 35.9*  --  31.2* 23.9* 32.5*  --  29.2* 17.6*  NEUTROABS 31.6*  --  26.5*  --  25.0*  --   --  13.1*  HGB 13.3   < > 11.3* 10.6* 10.9* 11.1* 10.6* 9.6*  HCT 38.7*   < > 32.8* 29.5* 31.5* 31.2* 31.2* 28.2*  MCV 88.4  --  89.4 87.5 88.7  --  89.7 91.0  PLT 348  --  359 385 411*  --  514* 513*   < > = values in this interval not displayed.    Basic Metabolic Panel: Recent Labs  Lab 06/02/23 1904 06/03/23 0223 06/03/23 1603 06/04/23 0803 06/05/23 0211 06/06/23 0435 06/07/23 0445  NA 133*   < > 126* 131* 130* 131* 128*  K 3.2*   < > 3.9 3.6 4.3 4.4 4.1  CL 98  --  99 104 102 102 97*  CO2  22  --  17* 18* 21* 22 24  GLUCOSE 107*  --  79 147* 97 97 111*  BUN 37*  --  18 17 15 12 6   CREATININE 0.92  --  0.67 0.77 0.58* 0.70 0.47*  CALCIUM 8.6*  --  7.6* 7.6* 7.3* 7.1* 7.4*  MG 2.6*  --   --   --  1.8  --  1.6*  PHOS  --   --   --   --  5.5*  --  3.8   < > = values in this interval not displayed.    GFR: Estimated Creatinine Clearance: 110.2 mL/min (A) (by C-G formula based on SCr of 0.47 mg/dL (L)).  Liver Function Tests: Recent Labs  Lab 06/02/23 1904 06/03/23 1603 06/04/23 0803 06/05/23 0211 06/06/23 0435 06/07/23 0445  AST 18 16 15   --  17 18  ALT 13 12 10   --  11 11  ALKPHOS 260* 163* 128*  --  74 72  BILITOT 0.7 1.1 1.1  --  0.5 0.3  PROT 7.0 5.5* 5.1*  --  4.4* 4.7*  ALBUMIN 2.8* <1.5* <1.5* <1.5* <1.5* <1.5*   No results for input(s): LIPASE, AMYLASE in the last 168 hours. No results for input(s): AMMONIA in the last 168 hours.  Coagulation Profile: Recent Labs  Lab 06/03/23 1603  INR 1.2    Cardiac Enzymes: No results for input(s): CKTOTAL, CKMB, CKMBINDEX, TROPONINI in the last 168 hours.  BNP (last 3 results) No results for input(s): PROBNP in the last 8760 hours.  Lipid Profile: No results for input(s): CHOL, HDL, LDLCALC, TRIG, CHOLHDL, LDLDIRECT in the last 72  hours.  Thyroid Function Tests: Recent Labs    06/07/23 0445  TSH 4.249    Anemia Panel: No results for input(s): VITAMINB12, FOLATE, FERRITIN, TIBC, IRON, RETICCTPCT in the last 72 hours.  Urine analysis:    Component Value Date/Time   COLORURINE YELLOW 06/02/2023 2216   APPEARANCEUR CLEAR 06/02/2023 2216   LABSPEC 1.021 06/02/2023 2216   PHURINE 6.0 06/02/2023 2216   GLUCOSEU NEGATIVE 06/02/2023 2216   HGBUR NEGATIVE 06/02/2023 2216   BILIRUBINUR NEGATIVE 06/02/2023 2216   KETONESUR NEGATIVE 06/02/2023 2216   PROTEINUR TRACE (A) 06/02/2023 2216   NITRITE NEGATIVE 06/02/2023 2216   LEUKOCYTESUR NEGATIVE 06/02/2023 2216    Sepsis Labs: Lactic Acid, Venous No results found for: LATICACIDVEN  MICROBIOLOGY: Recent Results (from the past 240 hours)  Resp panel by RT-PCR (RSV, Flu A&B, Covid) Anterior Nasal Swab     Status: None   Collection Time: 06/02/23  6:16 PM   Specimen: Anterior Nasal Swab  Result Value Ref Range Status   SARS Coronavirus 2 by RT PCR NEGATIVE NEGATIVE Final    Comment: (NOTE) SARS-CoV-2 target nucleic acids are NOT DETECTED.  The SARS-CoV-2 RNA is generally detectable in upper respiratory specimens during the acute phase of infection. The lowest concentration of SARS-CoV-2 viral copies this assay can detect is 138 copies/mL. A negative result does not preclude SARS-Cov-2 infection and should not be used as the sole basis for treatment or other patient management decisions. A negative result may occur with  improper specimen collection/handling, submission of specimen other than nasopharyngeal swab, presence of viral mutation(s) within the areas targeted by this assay, and inadequate number of viral copies(<138 copies/mL). A negative result must be combined with clinical observations, patient history, and epidemiological information. The expected result is Negative.  Fact Sheet for Patients:   bloggercourse.com  Fact Sheet for Healthcare  Providers:  seriousbroker.it  This test is no t yet approved or cleared by the [place]  FDA and  has been authorized for detection and/or diagnosis of SARS-CoV-2 by FDA under an Emergency Use Authorization (EUA). This EUA will remain  in effect (meaning this test can be used) for the duration of the COVID-19 declaration under Section 564(b)(1) of the Act, 21 U.S.C.section 360bbb-3(b)(1), unless the authorization is terminated  or revoked sooner.       Influenza A by PCR NEGATIVE NEGATIVE Final   Influenza B by PCR NEGATIVE NEGATIVE Final    Comment: (NOTE) The Xpert Xpress SARS-CoV-2/FLU/RSV plus assay is intended as an aid in the diagnosis of influenza from Nasopharyngeal swab specimens and should not be used as a sole basis for treatment. Nasal washings and aspirates are unacceptable for Xpert Xpress SARS-CoV-2/FLU/RSV testing.  Fact Sheet for Patients: bloggercourse.com  Fact Sheet for Healthcare Providers: seriousbroker.it  This test is not yet approved or cleared by the [place]  FDA and has been authorized for detection and/or diagnosis of SARS-CoV-2 by FDA under an Emergency Use Authorization (EUA). This EUA will remain in effect (meaning this test can be used) for the duration of the COVID-19 declaration under Section 564(b)(1) of the Act, 21 U.S.C. section 360bbb-3(b)(1), unless the authorization is terminated or revoked.     Resp Syncytial Virus by PCR NEGATIVE NEGATIVE Final    Comment: (NOTE) Fact Sheet for Patients: bloggercourse.com  Fact Sheet for Healthcare Providers: seriousbroker.it  This test is not yet approved or cleared by the [place]  FDA and has been authorized for detection and/or diagnosis of SARS-CoV-2 by FDA under an Emergency Use  Authorization (EUA). This EUA will remain in effect (meaning this test can be used) for the duration of the COVID-19 declaration under Section 564(b)(1) of the Act, 21 U.S.C. section 360bbb-3(b)(1), unless the authorization is terminated or revoked.  Performed at [facility], [address], [place], [place] [address]   Culture, blood (Routine X 2) w Reflex to ID Panel     Status: None (Preliminary result)   Collection Time: 06/03/23  1:30 AM   Specimen: BLOOD LEFT FOREARM  Result Value Ref Range Status   Specimen Description   Final    BLOOD LEFT FOREARM Performed at [facility], [address] [place], [place] [address]    Special Requests   Final    BOTTLES DRAWN AEROBIC AND ANAEROBIC Blood Culture results may not be optimal due to an inadequate volume of blood received in culture bottles Performed at [facility], [address] [place], [place] [address]    Culture   Final    NO GROWTH 4 DAYS Performed at [facility], [address], [place], [place] [address]    Report Status PENDING  Incomplete  Culture, blood (Routine X 2) w Reflex to ID Panel     Status: None (Preliminary result)   Collection Time: 06/03/23  1:39 AM   Specimen: BLOOD LEFT HAND  Result Value Ref Range Status   Specimen Description   Final    BLOOD LEFT HAND Performed at [facility], [address] [place], [place] [address]    Special Requests   Final    BOTTLES DRAWN AEROBIC AND ANAEROBIC Blood Culture adequate volume Performed at [facility], [address] [place], [place] [address]    Culture   Final    NO GROWTH 4 DAYS Performed at [facility], [address] [place], [place] [address]    Report Status PENDING  Incomplete  Expectorated Sputum Assessment w Gram Stain, Rflx to Resp Cult     Status: None   Collection Time: 06/03/23  4:48 AM   Specimen: Sputum  Result Value  Ref Range Status   Specimen Description   Final    SPUTUM Performed at [facility], [address] [place], [place] [address]    Special Requests   Final    Normal Performed at [facility], [address] [place], [place] [address]    Sputum evaluation   Final    THIS SPECIMEN IS ACCEPTABLE FOR SPUTUM CULTURE Performed at [facility], [address], [place], [place] [address]    Report Status 06/03/2023 FINAL  Final  Culture, Respiratory w Gram Stain     Status: None   Collection Time: 06/03/23  4:48 AM   Specimen: SPU  Result Value Ref Range Status   Specimen Description SPUTUM  Final   Special Requests Normal Reflexed from [id number]  Final   Gram Stain   Final    MODERATE WBC PRESENT,BOTH PMN AND MONONUCLEAR FEW GRAM POSITIVE COCCI IN PAIRS FEW GRAM NEGATIVE RODS RARE YEAST WITH PSEUDOHYPHAE    Culture   Final    RARE STREPTOCOCCUS GROUP F Beta hemolytic streptococci are predictably susceptible to penicillin and other beta lactams. Susceptibility testing not routinely performed. WITHIN NORMAL RESPIRATORY FLORA Performed at [facility], [address] [place], [place] [address]    Report Status 06/06/2023 FINAL  Final  Body fluid culture w Gram Stain     Status: None   Collection Time: 06/03/23  4:35 PM   Specimen: Pleural Fluid  Result Value Ref Range Status   Specimen Description FLUID PLEURAL  Final   Special Requests NONE  Final   Gram Stain   Final    FEW WBC PRESENT,BOTH PMN AND MONONUCLEAR NO ORGANISMS SEEN    Culture   Final    NO GROWTH 3 DAYS Performed at [facility], [address] [place], [place] [address]    Report Status 06/07/2023 FINAL  Final  MRSA Next Gen by PCR, Nasal     Status: None   Collection Time: 06/04/23 12:20 AM   Specimen: Nasal Mucosa; Nasal Swab  Result Value Ref Range Status   MRSA by PCR Next Gen NOT DETECTED NOT DETECTED Final    Comment: (NOTE) The GeneXpert MRSA  Assay (FDA approved for NASAL specimens only), is one component of a comprehensive MRSA colonization surveillance program. It is not intended to diagnose MRSA infection nor to guide or monitor treatment for MRSA infections. Test performance is not FDA approved in patients less than 63 years old. Performed at [facility], [address] [place], [place] [address]   Culture, blood (Routine X 2) w Reflex to ID Panel     Status: None (Preliminary result)   Collection Time: 06/06/23  7:46 AM   Specimen: BLOOD RIGHT HAND  Result Value Ref Range Status   Specimen Description BLOOD RIGHT HAND  Final   Special Requests   Final    BOTTLES DRAWN AEROBIC AND ANAEROBIC Blood Culture results may not be optimal due to an inadequate volume of blood received in culture bottles   Culture   Final    NO GROWTH < 24 HOURS Performed at [facility], [address], [place], [place] [address]    Report Status  PENDING  Incomplete  Culture, blood (Routine X 2) w Reflex to ID Panel     Status: None (Preliminary result)   Collection Time: 06/06/23  7:46 AM   Specimen: BLOOD LEFT ARM  Result Value Ref Range Status   Specimen Description BLOOD LEFT ARM  Final   Special Requests   Final    BOTTLES DRAWN AEROBIC AND ANAEROBIC Blood Culture results may not be optimal due to an inadequate volume of blood received in culture bottles   Culture   Final    NO GROWTH < 24 HOURS Performed at [facility], [address], [place], [place] [address]    Report Status PENDING  Incomplete    RADIOLOGY STUDIES/RESULTS: CT MAXILLOFACIAL W CONTRAST Result Date: 06/06/2023 CLINICAL DATA:  Dental caries EXAM: CT MAXILLOFACIAL WITH CONTRAST TECHNIQUE: Multidetector CT imaging of the maxillofacial structures was performed with intravenous contrast. Multiplanar CT image reconstructions were also generated. RADIATION DOSE REDUCTION: This exam was performed according to the departmental dose-optimization program  which includes automated exposure control, adjustment of the mA and/or kV according to patient size and/or use of iterative reconstruction technique. CONTRAST:  75mL OMNIPAQUE  IOHEXOL  350 MG/ML SOLN COMPARISON:  None Available. FINDINGS: Osseous: No traumatic facial bone finding. Dental: Unerupted right maxillary tooth 1. Multiple caries evident affecting the anterior maxillary dentition from tooth 4 through tooth 12. Lucency around the roots particularly at teeth 8, 9 and 10. With respect to the mandibular dentition, there is advanced decay with multiple caries. Unfortunately there is a good bit of motion degradation. Pronounced lucency adjacent to the roots of the left mandibular dentition with a confluent root abscess in the right mandible affecting teeth 30 and 31. Orbits: Normal Sinuses: Clear Soft tissues: Negative Limited intracranial: Normal IMPRESSION: 1. Advanced dental and periodontal disease as outlined above. Electronically Signed   By: Oneil Officer M.D.   On: 06/06/2023 22:39   CT CHEST WO CONTRAST Result Date: 06/06/2023 CLINICAL DATA:  Pneumonia complication EXAM: CT CHEST WITHOUT CONTRAST TECHNIQUE: Multidetector CT imaging of the chest was performed following the standard protocol without IV contrast. RADIATION DOSE REDUCTION: This exam was performed according to the departmental dose-optimization program which includes automated exposure control, adjustment of the mA and/or kV according to patient size and/or use of iterative reconstruction technique. COMPARISON:  CT abdomen 06/02/2023 FINDINGS: Cardiovascular: Heart size normal. Blood pool is hypodense compared to the interventricular septum suggesting anemia. Trace pericardial fluid. Central great vessels unremarkable. Mediastinum/Nodes: Anterior mediastinal soft tissue density presumably residual thymic tissue. No definite mass or adenopathy, sensitivity decreased without contrast. Lungs/Pleura: Progressive moderate partially loculated left  pleural effusion despite placement of pigtail chest tube anteromedially at the lung base. Scattered loculated pleural gas bubbles may be related to chest tube placement versus infection. Persistent dense consolidation of the left lower lobe. Worsening dependent consolidation/atelectasis in the lingula and left upper lobe. Small right pleural effusion has developed. Consolidation with air bronchograms posteriorly in the right lower lobe. Subsegmental consolidation/atelectasis anteromedially in the right upper lobe. Upper Abdomen: No acute findings. Musculoskeletal: No chest wall mass or suspicious bone lesions identified. IMPRESSION: 1. Progressive moderate partially loculated left pleural effusion despite placement of pigtail chest tube. 2. Worsening dependent consolidation/atelectasis in the lingula and left upper lobe. 3. Persistent dense consolidation of the left lower lobe. 4. New small right pleural effusion. 5. New consolidation/atelectasis posteriorly in the right lower lobe. Electronically Signed   By: JONETTA Faes M.D.   On: 06/06/2023 16:52  DG Chest Port 1V same Day Result Date: 06/06/2023 CLINICAL DATA:  [id number] SOB (shortness of breath) [id number] EXAM: PORTABLE CHEST 1 VIEW COMPARISON:  Chest x-ray 06/05/2023, CT abdomen pelvis 06/02/2023 FINDINGS: Left chest tube with pigtail overlying the lower medial left hemithorax. The heart and mediastinal contours are within normal limits. Left lung airspace opacity. No pulmonary edema. Interval increase in size of a loculated moderate to large volume left pleural effusion. No right pleural effusion. No pneumothorax. No acute osseous abnormality. IMPRESSION: 1. Interval increase in size of a loculated moderate to large volume left pleural effusion. 2. Underlying left lung airspace opacity may represent combination of infection/inflammation and atelectasis. 3. Left chest tube stable. Electronically Signed   By: Morgane  Naveau M.D.   On: 06/06/2023 07:59   DG CHEST  PORT 1 VIEW Result Date: 06/05/2023 CLINICAL DATA:  Pain EXAM: PORTABLE CHEST 1 VIEW COMPARISON:  Chest x-ray 06/05/2023 FINDINGS: Left-sided chest tube is unchanged in position. Left pleural effusion which appears partially loculated is unchanged from prior. There is no pneumothorax. There is a new band of opacity in the right suprahilar region. Patchy opacities in the left lower lung have increased. Cardiomediastinal silhouette is within normal limits. No acute fractures are seen. IMPRESSION: 1. Left-sided chest tube is unchanged in position. 2. Stable partially loculated left pleural effusion. 3. New band of opacity in the right suprahilar region. 4. Increasing left basilar air atelectasis/airspace disease. Electronically Signed   By: Greig Pique M.D.   On: 06/05/2023 18:30     LOS: 4 days   Donalda Applebaum, MD  [facility]    To contact the attending provider between 7A-7P or the covering provider during after hours 7P-7A, please log into the web site [email or web address] and access using universal [place] password for that web site. If you do not have the password, please call the hospital operator.  06/07/2023, 12:06 PM

## 2023-06-07 NOTE — Plan of Care (Signed)

## 2023-06-07 NOTE — Care Management (Signed)
 Patient is refusing to have chest tube dressing changed, stating he is scared because it hurts and it bleed last time. Patient also refusing bath and oral care/ linen change.

## 2023-06-08 ENCOUNTER — Inpatient Hospital Stay (HOSPITAL_COMMUNITY): Payer: MEDICAID

## 2023-06-08 DIAGNOSIS — J90 Pleural effusion, not elsewhere classified: Secondary | ICD-10-CM

## 2023-06-08 LAB — CULTURE, BLOOD (ROUTINE X 2)
Culture: NO GROWTH
Culture: NO GROWTH
Special Requests: ADEQUATE

## 2023-06-08 LAB — BASIC METABOLIC PANEL
Anion gap: 7 (ref 5–15)
BUN: <5 mg/dL — ABNORMAL LOW (ref 6–20)
CO2: 26 mmol/L (ref 22–32)
Calcium: 7.5 mg/dL — ABNORMAL LOW (ref 8.9–10.3)
Chloride: 97 mmol/L — ABNORMAL LOW (ref 98–111)
Creatinine, Ser: 0.51 mg/dL — ABNORMAL LOW (ref 0.61–1.24)
GFR, Estimated: >60 mL/min (ref >60)
Glucose, Bld: 90 mg/dL (ref 70–99)
Potassium: 4.3 mmol/L (ref 3.5–5.1)
Sodium: 130 mmol/L — ABNORMAL LOW (ref 135–145)

## 2023-06-08 LAB — CBC
HCT: 31.1 % — ABNORMAL LOW (ref 39.0–52.0)
Hemoglobin: 10.4 g/dL — ABNORMAL LOW (ref 13.0–17.0)
MCH: 30.6 pg (ref 26.0–34.0)
MCHC: 33.4 g/dL (ref 30.0–36.0)
MCV: 91.5 fL (ref 80.0–100.0)
Platelets: 734 K/uL — ABNORMAL HIGH (ref 150–400)
RBC: 3.4 MIL/uL — ABNORMAL LOW (ref 4.22–5.81)
RDW: 14.3 % (ref 11.5–15.5)
WBC: 16 K/uL — ABNORMAL HIGH (ref 4.0–10.5)
nRBC: 0 % (ref 0.0–0.2)

## 2023-06-08 LAB — MAGNESIUM: Magnesium: 1.9 mg/dL (ref 1.7–2.4)

## 2023-06-08 LAB — PHOSPHORUS: Phosphorus: 4.3 mg/dL (ref 2.5–4.6)

## 2023-06-08 MED ORDER — SODIUM CHLORIDE (PF) 0.9 % IJ SOLN
10.0000 mg | Freq: Once | INTRAMUSCULAR | Status: AC
  Administered 2023-06-08: 10 mg via INTRAPLEURAL
  Filled 2023-06-08: qty 10

## 2023-06-08 MED ORDER — SENNOSIDES-DOCUSATE SODIUM 8.6-50 MG PO TABS
2.0000 | ORAL_TABLET | Freq: Every day | ORAL | Status: DC
  Administered 2023-06-08 – 2023-06-10 (×3): 2 via ORAL
  Filled 2023-06-08 (×3): qty 2

## 2023-06-08 MED ORDER — BISACODYL 10 MG RE SUPP: 10.0000 mg | Freq: Every day | RECTAL | Status: DC | PRN

## 2023-06-08 MED ORDER — POLYETHYLENE GLYCOL 3350 17 G PO PACK
17.0000 g | PACK | Freq: Two times a day (BID) | ORAL | Status: DC
Start: 2023-06-08 — End: 2023-06-13
  Administered 2023-06-08 – 2023-06-09 (×2): 17 g via ORAL
  Filled 2023-06-08 (×7): qty 1

## 2023-06-08 MED ORDER — FLEET ENEMA RE ENEM: 1.0000 | ENEMA | Freq: Every day | RECTAL | Status: DC | PRN

## 2023-06-08 MED ORDER — STERILE WATER FOR INJECTION IJ SOLN
5.0000 mg | Freq: Once | RESPIRATORY_TRACT | Status: AC
  Administered 2023-06-08: 5 mg via INTRAPLEURAL
  Filled 2023-06-08 (×2): qty 5

## 2023-06-08 NOTE — Progress Notes (Cosign Needed)
 0700 pt awake and alert at start of shift. Assessment preformed. 2 chest tubes present and draining with reddish ping fluid. Pt refused dressing changes and chest tube flushing. Breath sounds diminished on left side. Skin dry with cracked lips. Lip balm encouraged.   1030 Med passed. Pt. Agitated and requested we leave the room prior to antibiotic administration. Education provided on importance of antibiotics to fight infection. Pt. Consented and med administration continued. IV flushed and infusion started.  1209 Pt. C/o pain at IV site. IV assesed and flushed w/o blood return. No swelling or reddness noted. IV removed at pt. Request due to pain. New IV placed and infusion resumed.   1543 PA rounder on pt. When they attempted to observe site client refused. Assessment on client understanding for infection exacerbation done. Client verbalized refusal of chest tube inspection and dressing change. PA notified.

## 2023-06-08 NOTE — Plan of Care (Signed)
  Problem: Clinical Measurements: Goal: Diagnostic test results will improve Outcome: Progressing Goal: Respiratory complications will improve Outcome: Progressing   Problem: Activity: Goal: Risk for activity intolerance will decrease Outcome: Progressing   Problem: Nutrition: Goal: Adequate nutrition will be maintained Outcome: Progressing   Problem: Coping: Goal: Level of anxiety will decrease Outcome: Progressing   Problem: Elimination: Goal: Will not experience complications related to bowel motility Outcome: Progressing

## 2023-06-08 NOTE — Progress Notes (Signed)
 Physical Therapy Treatment Patient Details Name: Brent Bryant MRN: [id number] DOB: 1990-09-03 Today's Date: 06/08/2023   History of Present Illness Pt is a 33 y/o male presenting on 1/31 with 3 days of cough and chest pain. Work up revealed L pleural space and dense pneumonia. 2/1 s/p small bored chest tube.  Chest CT with large loculated left effusion.2/5 IR for guided chest tube placement. PMH includes: hepC, substance abuse.    PT Comments  Pt tolerated treatment well today. Pt was able to ambulate in hallway with no AD at supervision level today. +2 for safety and management of lines and leads plus multiple chest tubes. DC recs updated to no PT follow up upon DC. PT will continue to follow.    If plan is discharge home, recommend the following: Assist for transportation   Can travel by private vehicle        Equipment Recommendations  None recommended by PT    Recommendations for Other Services       Precautions / Restrictions Precautions Precautions: Fall Precaution Comments: L chest tube x2 Restrictions Weight Bearing Restrictions Per Provider Order: No     Mobility  Bed Mobility Overal bed mobility: Modified Independent                  Transfers Overall transfer level: Needs assistance Equipment used: None Transfers: Sit to/from Stand Sit to Stand: Supervision           General transfer comment: no LOB noted.    Ambulation/Gait Ambulation/Gait assistance: Supervision, +2 safety/equipment Gait Distance (Feet): 350 Feet Assistive device: None Gait Pattern/deviations: Decreased stride length, Step-through pattern   Gait velocity interpretation: >2.62 ft/sec, indicative of community ambulatory   General Gait Details: Pt overall steady. no LOB noted. +2 for management of chest tubes and lines and leads.   Stairs             Wheelchair Mobility     Tilt Bed    Modified Rankin (Stroke Patients Only)       Balance Overall balance  assessment: No apparent balance deficits (not formally assessed)                                          Cognition Arousal: Alert Behavior During Therapy: WFL for tasks assessed/performed Overall Cognitive Status: Within Functional Limits for tasks assessed                                          Exercises      General Comments General comments (skin integrity, edema, etc.): VSS on RA      Pertinent Vitals/Pain Pain Assessment Pain Assessment: Faces Faces Pain Scale: Hurts a little bit Pain Location: L flank Pain Descriptors / Indicators: Discomfort, Grimacing Pain Intervention(s): Monitored during session, Premedicated before session    Home Living                          Prior Function            PT Goals (current goals can now be found in the care plan section) Progress towards PT goals: Progressing toward goals    Frequency    Min 1X/week      PT Plan  Co-evaluation              AM-PAC PT 6 Clicks Mobility   Outcome Measure  Help needed turning from your back to your side while in a flat bed without using bedrails?: None Help needed moving from lying on your back to sitting on the side of a flat bed without using bedrails?: None Help needed moving to and from a bed to a chair (including a wheelchair)?: None Help needed standing up from a chair using your arms (e.g., wheelchair or bedside chair)?: A Little Help needed to walk in hospital room?: A Little Help needed climbing 3-5 steps with a railing? : A Little 6 Click Score: 21    End of Session Equipment Utilized During Treatment:  (Deferred gait belt due to chest tubes) Activity Tolerance: Patient tolerated treatment well Patient left: in bed;with call bell/phone within reach;with nursing/sitter in room Nurse Communication: Mobility status PT Visit Diagnosis: Other abnormalities of gait and mobility (R26.89)     Time: 8944-8887 PT Time  Calculation (min) (ACUTE ONLY): 17 min  Charges:    $Gait Training: 8-22 mins PT General Charges $$ ACUTE PT VISIT: 1 Visit                     Sueellen NOVAK, PT, DPT Acute Rehab Services [phone]    Jaycie Kregel 06/08/2023, 11:55 AM

## 2023-06-08 NOTE — Progress Notes (Signed)
 [facility]  Date of Admission:  06/02/2023     Total days of antibiotics 7         ASSESSMENT:  Brent Bryant is s/p CT guided left thoracostomy tube placement in the setting of pneumonia and empyema. Breathing improved and tolerating antibiotics.  Blood cultures from 06/06/23 remain without growth to date. Discussed plan of care to continue with current dose of ampicillin -sulbactam with drain management per Pulmonary and IR. Remaining medical and supportive care per Internal Medicine.   PLAN:  Continue current dose of ampicillin -sulbactam.  Chest tube management per IR and Pulmonology Monitor blood cultures for bacteremia. Remaining medical and supportive care per Internal Medicine.    Principal Problem:   CAP (community acquired pneumonia) Active Problems:   Polysubstance abuse (HCC)   Severe sepsis (HCC)   Empyema lung (HCC)   Protein-calorie malnutrition, severe   Empyema (HCC)    feeding supplement  237 mL Oral BID BM   guaiFENesin   600 mg Oral BID   lidocaine   2 patch Transdermal Q24H   multivitamin with minerals  1 tablet Oral Daily   polyethylene glycol  17 g Oral BID   senna-docusate  2 tablet Oral QHS   sodium chloride  flush  10 mL Intrapleural Q8H    SUBJECTIVE:  Afebrile overnight with no acute events. Feeling better and breathing better. Left sided chest pain in area of chest tube. Tolerating antibiotics with no adverse side effects.   Allergies  Allergen Reactions   Tylenol  [Acetaminophen ] Rash   Vicodin [Hydrocodone-Acetaminophen ] Rash     Review of Systems: Review of Systems  Constitutional:  Negative for chills, fever and weight loss.  Respiratory:  Negative for cough, shortness of breath and wheezing.   Cardiovascular:  Positive for chest pain. Negative for leg swelling.  Gastrointestinal:  Negative for abdominal pain, constipation, diarrhea, nausea and vomiting.  Skin:  Negative for rash.       OBJECTIVE: Vitals:   06/07/23 2150 06/08/23 0139 06/08/23 0342 06/08/23 0908  BP: 111/76 (!) 95/58 101/62 97/63  Pulse: (!) 109 97 99 (!) 103  Resp:  (!) 21 (!) 24 20  Temp:   98.4 F (36.9 C) 97.8 F (36.6 C)  TempSrc:   Oral Oral  SpO2:  96% 93% 95%  Weight:      Height:       Body mass index is 20.08 kg/m.  Physical Exam Constitutional:      General: He is not in acute distress.    Appearance: He is well-developed.  Cardiovascular:     Rate and Rhythm: Normal rate and regular rhythm.     Heart sounds: Normal heart sounds.  Pulmonary:     Effort: Pulmonary effort is normal.     Breath sounds: Normal breath sounds.     Comments: Chest pain near insertion of chest tube which is draining and patent.  Skin:    General: Skin is warm and dry.  Neurological:     Mental Status: He is alert.     Lab Results Lab Results  Component Value Date   WBC 16.0 (H) 06/08/2023   HGB 10.4 (L) 06/08/2023   HCT 31.1 (L) 06/08/2023   MCV 91.5 06/08/2023   PLT 734 (H) 06/08/2023    Lab Results  Component Value Date   CREATININE 0.51 (L) 06/08/2023   BUN <5 (L) 06/08/2023   NA 130 (L) 06/08/2023   K 4.3 06/08/2023   CL 97 (L) 06/08/2023  CO2 26 06/08/2023    Lab Results  Component Value Date   ALT 11 06/07/2023   AST 18 06/07/2023   ALKPHOS 72 06/07/2023   BILITOT 0.3 06/07/2023     Microbiology: Recent Results (from the past 240 hours)  Resp panel by RT-PCR (RSV, Flu A&B, Covid) Anterior Nasal Swab     Status: None   Collection Time: 06/02/23  6:16 PM   Specimen: Anterior Nasal Swab  Result Value Ref Range Status   SARS Coronavirus 2 by RT PCR NEGATIVE NEGATIVE Final    Comment: (NOTE) SARS-CoV-2 target nucleic acids are NOT DETECTED.  The SARS-CoV-2 RNA is generally detectable in upper respiratory specimens during the acute phase of infection. The lowest concentration of SARS-CoV-2 viral copies this assay can detect is 138 copies/mL. A negative result  does not preclude SARS-Cov-2 infection and should not be used as the sole basis for treatment or other patient management decisions. A negative result may occur with  improper specimen collection/handling, submission of specimen other than nasopharyngeal swab, presence of viral mutation(s) within the areas targeted by this assay, and inadequate number of viral copies(<138 copies/mL). A negative result must be combined with clinical observations, patient history, and epidemiological information. The expected result is Negative.  Fact Sheet for Patients:  bloggercourse.com  Fact Sheet for Healthcare Providers:  seriousbroker.it  This test is no t yet approved or cleared by the [place]  FDA and  has been authorized for detection and/or diagnosis of SARS-CoV-2 by FDA under an Emergency Use Authorization (EUA). This EUA will remain  in effect (meaning this test can be used) for the duration of the COVID-19 declaration under Section 564(b)(1) of the Act, 21 U.S.C.section 360bbb-3(b)(1), unless the authorization is terminated  or revoked sooner.       Influenza A by PCR NEGATIVE NEGATIVE Final   Influenza B by PCR NEGATIVE NEGATIVE Final    Comment: (NOTE) The Xpert Xpress SARS-CoV-2/FLU/RSV plus assay is intended as an aid in the diagnosis of influenza from Nasopharyngeal swab specimens and should not be used as a sole basis for treatment. Nasal washings and aspirates are unacceptable for Xpert Xpress SARS-CoV-2/FLU/RSV testing.  Fact Sheet for Patients: bloggercourse.com  Fact Sheet for Healthcare Providers: seriousbroker.it  This test is not yet approved or cleared by the [place]  FDA and has been authorized for detection and/or diagnosis of SARS-CoV-2 by FDA under an Emergency Use Authorization (EUA). This EUA will remain in effect (meaning this test can be used) for  the duration of the COVID-19 declaration under Section 564(b)(1) of the Act, 21 U.S.C. section 360bbb-3(b)(1), unless the authorization is terminated or revoked.     Resp Syncytial Virus by PCR NEGATIVE NEGATIVE Final    Comment: (NOTE) Fact Sheet for Patients: bloggercourse.com  Fact Sheet for Healthcare Providers: seriousbroker.it  This test is not yet approved or cleared by the [place]  FDA and has been authorized for detection and/or diagnosis of SARS-CoV-2 by FDA under an Emergency Use Authorization (EUA). This EUA will remain in effect (meaning this test can be used) for the duration of the COVID-19 declaration under Section 564(b)(1) of the Act, 21 U.S.C. section 360bbb-3(b)(1), unless the authorization is terminated or revoked.  Performed at [facility], [address] [place], [place] [address]   Culture, blood (Routine X 2) w Reflex to ID Panel     Status: None   Collection Time: 06/03/23  1:30 AM   Specimen: BLOOD LEFT FOREARM  Result Value Ref Range  Status   Specimen Description   Final    BLOOD LEFT FOREARM Performed at [facility], [address] [place], [place] [address]    Special Requests   Final    BOTTLES DRAWN AEROBIC AND ANAEROBIC Blood Culture results may not be optimal due to an inadequate volume of blood received in culture bottles Performed at [facility], [address] [place], [place] [address]    Culture   Final    NO GROWTH 5 DAYS Performed at [facility], [address] [place], [place] [address]    Report Status 06/08/2023 FINAL  Final  Culture, blood (Routine X 2) w Reflex to ID Panel     Status: None   Collection Time: 06/03/23  1:39 AM   Specimen: BLOOD LEFT HAND  Result Value Ref Range Status   Specimen Description   Final    BLOOD LEFT HAND Performed at [facility], [address] [place], [place] [address]    Special Requests   Final    BOTTLES DRAWN AEROBIC AND ANAEROBIC Blood Culture adequate volume Performed at [facility], [address] [place], [place] [address]    Culture   Final    NO GROWTH 5 DAYS Performed at [facility], [address], [place], [place] [address]    Report Status 06/08/2023 FINAL  Final  Expectorated Sputum Assessment w Gram Stain, Rflx to Resp Cult     Status: None   Collection Time: 06/03/23  4:48 AM   Specimen: Sputum  Result Value Ref Range Status   Specimen Description   Final    SPUTUM Performed at [facility], [address], [place], [place] [address]    Special Requests   Final    Normal Performed at [facility], [address], [place], [place] [address]    Sputum evaluation   Final    THIS SPECIMEN IS ACCEPTABLE FOR SPUTUM CULTURE Performed at [facility], [address], [place], [place] [address]    Report Status 06/03/2023 FINAL  Final  Culture, Respiratory w Gram Stain     Status: None   Collection Time: 06/03/23  4:48 AM   Specimen: SPU  Result Value Ref Range Status   Specimen Description SPUTUM  Final   Special Requests Normal Reflexed from [id number]  Final   Gram Stain   Final    MODERATE WBC PRESENT,BOTH PMN AND MONONUCLEAR FEW GRAM POSITIVE COCCI IN PAIRS FEW GRAM NEGATIVE RODS RARE YEAST WITH PSEUDOHYPHAE    Culture   Final    RARE STREPTOCOCCUS GROUP F Beta hemolytic streptococci are predictably susceptible to penicillin and other beta lactams. Susceptibility testing not routinely performed. WITHIN NORMAL RESPIRATORY FLORA Performed at [facility], [address], [place], [place] [address]    Report Status 06/06/2023 FINAL  Final  Body fluid culture w Gram Stain     Status: None   Collection Time: 06/03/23  4:35 PM   Specimen: Pleural Fluid  Result Value Ref Range Status   Specimen Description FLUID PLEURAL   Final   Special Requests NONE  Final   Gram Stain   Final    FEW WBC PRESENT,BOTH PMN AND MONONUCLEAR NO ORGANISMS SEEN    Culture   Final    NO GROWTH 3 DAYS Performed at [facility], [address] [place], [place] [address]    Report Status 06/07/2023 FINAL  Final  Fungus  Culture With Stain     Status: None (Preliminary result)   Collection Time: 06/03/23  4:35 PM   Specimen: Pleural, Left; Pleural Fluid  Result Value Ref Range Status   Fungus Stain Final report  Final    Comment: (NOTE) Performed At: [facility] [address] [place], [place] [id number] Jennette Shorter MD Ey:[id number]    Fungus (Mycology) Culture PENDING  Incomplete   Fungal Source FLUID  Final    Comment: PLEURAL Performed at [facility], [address], [place], [place] [address]   Fungus Culture Result     Status: None   Collection Time: 06/03/23  4:35 PM  Result Value Ref Range Status   Result 1 Comment  Final    Comment: (NOTE) KOH/Calcofluor preparation:  no fungus observed. Performed At: [facility] [address] [place], [place] [id number] Jennette Shorter MD Ey:[id number]   MRSA Next Gen by PCR, Nasal     Status: None   Collection Time: 06/04/23 12:20 AM   Specimen: Nasal Mucosa; Nasal Swab  Result Value Ref Range Status   MRSA by PCR Next Gen NOT DETECTED NOT DETECTED Final    Comment: (NOTE) The GeneXpert MRSA Assay (FDA approved for NASAL specimens only), is one component of a comprehensive MRSA colonization surveillance program. It is not intended to diagnose MRSA infection nor to guide or monitor treatment for MRSA infections. Test performance is not FDA approved in patients less than 11 years old. Performed at [facility], [address], [place], [place] [address]   Culture, blood (Routine X 2) w Reflex to ID Panel     Status: None (Preliminary result)   Collection Time: 06/06/23  7:46 AM   Specimen: BLOOD RIGHT HAND  Result Value Ref  Range Status   Specimen Description BLOOD RIGHT HAND  Final   Special Requests   Final    BOTTLES DRAWN AEROBIC AND ANAEROBIC Blood Culture results may not be optimal due to an inadequate volume of blood received in culture bottles   Culture   Final    NO GROWTH 2 DAYS Performed at [facility], [address] [place], [place] [address]    Report Status PENDING  Incomplete  Culture, blood (Routine X 2) w Reflex to ID Panel     Status: None (Preliminary result)   Collection Time: 06/06/23  7:46 AM   Specimen: BLOOD LEFT ARM  Result Value Ref Range Status   Specimen Description BLOOD LEFT ARM  Final   Special Requests   Final    BOTTLES DRAWN AEROBIC AND ANAEROBIC Blood Culture results may not be optimal due to an inadequate volume of blood received in culture bottles   Culture   Final    NO GROWTH 2 DAYS Performed at [facility], [address], [place], [place] [address]    Report Status PENDING  Incomplete    I have personally spent 25 minutes involved in face-to-face and non-face-to-face activities for this patient on the day of the visit. Professional time spent includes the following activities: Preparing to see the patient (review of tests), Obtaining and/or reviewing separately obtained history (admission/discharge record), Performing a medically appropriate examination and/or evaluation , Ordering medications/tests/procedures, referring and communicating with other health care professionals, Documenting clinical information in the EMR, Independently interpreting results (not separately reported), Communicating results to the patient/family/caregiver, Counseling and educating the patient/family/caregiver and Care coordination (not separately reported).    Brent Chantale Leugers, NP [facility]  06/08/2023  1:38 PM

## 2023-06-08 NOTE — Plan of Care (Signed)
  Problem: Education: Goal: Knowledge of General Education information will improve Description: Including pain rating scale, medication(s)/side effects and non-pharmacologic comfort measures Outcome: Progressing   Problem: Health Behavior/Discharge Planning: Goal: Ability to manage health-related needs will improve Outcome: Progressing   Problem: Clinical Measurements: Goal: Will remain free from infection Outcome: Progressing Goal: Diagnostic test results will improve Outcome: Progressing Goal: Respiratory complications will improve Outcome: Progressing   Problem: Activity: Goal: Risk for activity intolerance will decrease Outcome: Progressing   Problem: Coping: Goal: Level of anxiety will decrease Outcome: Progressing   Problem: Pain Managment: Goal: General experience of comfort will improve and/or be controlled Outcome: Progressing   Problem: Safety: Goal: Ability to remain free from injury will improve Outcome: Progressing

## 2023-06-08 NOTE — Progress Notes (Signed)
   NAME:  Brent Bryant, MRN:  [id number], DOB:  Apr 05, 1991, LOS: 5 ADMISSION DATE:  06/02/2023, CONSULTATION DATE:  06/03/23 REFERRING MD:  Raenelle, CHIEF COMPLAINT:  flank pain   History of Present Illness:  33 year old man with hx of HepC, substance abuse presenting with 3 days of cough and chest pain.   Cough with brown sputum.  Chest pain is pleuritic.  Workup has revealed complex L pleural space and dense pneumonia.  PCCM consulted for assistance with management.  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     Pertinent  Medical History  HepC  Significant Hospital Events: Including procedures, antibiotic start and stop dates in addition to other pertinent events   2/1 admit, small bored chest tube placed  2/2 First dose pleural lytics, out of chest tune since insertion 2/3 2nd dose of lytics. After intrapleural lytics patient had severe pain associated with tachycardia and tachypnea.  2/5 2nd CT placed posteriorly by IR 2/6 repeat dose of lytics in new chest tube  Interim History / Subjective:   CT outpt 1.2 L last 24 hours in new posterior CT CXR w/ some improvement in lung expansion; some pleural effusion persist  Objective   Blood pressure 97/63, pulse (!) 103, temperature 97.8 F (36.6 C), temperature source Oral, resp. rate 20, height 5' 8 (1.727 m), weight 59.9 kg, SpO2 95%.        Intake/Output Summary (Last 24 hours) at 06/08/2023 1355 Last data filed at 06/08/2023 1111 Gross per 24 hour  Intake 20 ml  Output 560 ml  Net -540 ml   Filed Weights   06/03/23 2243 06/06/23 1400  Weight: 59 kg 59.9 kg   Physical Exam: General: NAD HEENT: MM pink/moist Neuro: Aox3; MAE CV: s1s2, RRR, no m/r/g PULM:  dim clear BS bilaterally GI: soft, bsx4 active  Extremities: warm/dry, no edema  Skin: no rashes or lesions    Resolved Hospital Problem list   N/A  Assessment & Plan:  Empyema Sepsis Hx polysubstance abuse -S/p lytics 2/2 and 2/3.  -another CT tube  posterior placed on 2/5 Plan: -repeat lytics today -cont CT to -20 cmH2O  -monitor CT outpt -CXR in am -unasyn  -follow cultures -pain management per primary    Best Practice (right click and Reselect all SmartList Selections daily)  Per primary  Critical care time: N/A    RODGER Emilio DEVONNA [facility] 06/08/2023, 3:22 PM  Please see [email or web address] for pager details.  From 7A-7P if no response, please call [phone]. After hours, please call ELink [phone].

## 2023-06-08 NOTE — Procedures (Signed)
 Pleural Fibrinolytic Administration Procedure Note  Brent Bryant  [id number]  05-Nov-1990  Date:06/08/23  Time:5:29 PM   Provider Performing:Caria Transue D Emilio   Procedure: Pleural Fibrinolysis Subsequent day [phone])  Indication(s) Fibrinolysis of complicated pleural effusion  Consent Risks of the procedure as well as the alternatives and risks of each were explained to the patient and/or caregiver.  Consent for the procedure was obtained.   Anesthesia None   Time Out Verified patient identification, verified procedure, site/side was marked, verified correct patient position, special equipment/implants available, medications/allergies/relevant history reviewed, required imaging and test results available.   Sterile Technique Hand hygiene, gloves   Procedure Description Existing pleural catheter was cleaned and accessed in sterile manner.  10mg  of tPA in 30cc of saline and 5mg  of dornase in 30cc of sterile water  were injected into pleural space using existing pleural catheter.  Catheter will be clamped for 1 hour and then placed back to suction.   Complications/Tolerance None; patient tolerated the procedure well.   EBL None   Specimen(s) None  JD Emilio RIGGERS [facility] 06/08/2023, 5:29 PM  Please see [email or web address] for pager details.  From 7A-7P if no response, please call [phone]. After hours, please call ELink [phone].

## 2023-06-08 NOTE — Progress Notes (Addendum)
 PROGRESS NOTE        PATIENT DETAILS Name: Brent Bryant Age: 33 y.o. Sex: male Date of Birth: Nov 21, 1990 Admit Date: 06/02/2023 Admitting Physician [name], MD ERE:[name], [name], [name]  Brief Summary: Patient is a 33 y.o.  male with history polysubstance abuse, chronic HCV-who presented with severe left-sided chest pain/flank pain-found to have sepsis secondary to left lower lobe pneumonia with empyema  Significant events: 2/01>> admit to [facility] consult-chest tube placed (pleural fluid-LDH 1174, WBC 7700) 2/2>> intrapleural lytics 2/3>> intrapleural lytics 2/4>> CT chest: Worsening left pleural effusion 2/5>> IR consult for second chest tube  Significant studies: 1/31>> CT abdomen/pelvis: Loculated pleural effusion-hide left lower PNA. 2/02>> TTE: EF 65-70% 2/04>> CT chest: Worsening loculated left pleural effusion despite placement of pigtail 2/04>> CT maxillofacial: Advanced dental/periodontal disease.  Significant microbiology data: 1/31>> COVID/influenza/RSV PCR: Negative 2/01>> pleural fluid culture: No growth 2/01>> sputum culture: rare strep group F 2/01>> blood culture: No growth 2/04>> blood culture: No growth  Procedures: 2/01>> chest tube placement by PCCM 2/05>> second chest tube placement by IR  Consults: PCCM ID  Subjective: Feels much better today-no other complaints.  Objective: Vitals: Blood pressure 97/63, pulse (!) 103, temperature 97.8 F (36.6 C), temperature source Oral, resp. rate 20, height 5' 8 (1.727 m), weight 59.9 kg, SpO2 95%.   Exam: Gen Exam:Alert awake-not in any distress HEENT:atraumatic, normocephalic Chest: B/L clear to auscultation anteriorly CVS:S1S2 regular Abdomen:soft non tender, non distended Extremities:no edema Neurology: Non focal Skin: no rash  Pertinent Labs/Radiology:    Latest Ref Rng & Units 06/08/2023    4:37 AM 06/07/2023    4:45 AM 06/06/2023    4:35 AM  CBC  WBC 4.0 -  10.5 K/uL 16.0  17.6  29.2   Hemoglobin 13.0 - 17.0 g/dL 89.5  9.6  89.3   Hematocrit 39.0 - 52.0 % 31.1  28.2  31.2   Platelets 150 - 400 K/uL 734  513  514     Lab Results  Component Value Date   NA 130 (L) 06/08/2023   K 4.3 06/08/2023   CL 97 (L) 06/08/2023   CO2 26 06/08/2023    Assessment/Plan: Sepsis secondary to left lower lobe PNA with empyema-s/p chest tube placement by PCCM on 2/1 Sepsis physiology improving First chest tube placed by PCCM on 2/1 and received lytics on 2/2 and 2/3-due to enlarging pleural effusion-second chest tube placed by IR on 2/5 Remains on Unasyn  PCCM following-will await further recommendations. Following.  Dental caries/poor oral hygiene Outpatient dentistry follow-up-no inpatient dentist coverage at [facility].  Chronic HCV Outpatient treatment if patient follows up in the ID clinic.  Normocytic anemia Secondary to acute illness Follow CBC  Hyponatremia Mild Asymptomatic Repeat electrolytes tomorrow  History of polysubstance abuse-UDS positive for opiates/cocaine/THC Per patient-he has been clean for more than 6 months--suspects that his UDS is positive as he was exposed to cocaine that his brother was using in his apartment.  Underweight: Cachectic/emaciated-will get nutrition evaluation HIV negative TSH stable  Nutrition Status: Nutrition Problem: Severe Malnutrition Etiology: social / environmental circumstances Signs/Symptoms: severe muscle depletion, severe fat depletion Interventions: MVI, Hormel Shake    Estimated body mass index is 20.08 kg/m as calculated from the following:   Height as of this encounter: 5' 8 (1.727 m).   Weight as of this encounter: 59.9 kg.   Code status:  Code Status: Full Code   DVT Prophylaxis: Place and maintain sequential compression device Start: 06/05/23 2016   Family Communication: None at bedside   Disposition Plan: Status is: Inpatient Remains inpatient appropriate because:     Planned Discharge Destination:Home   Diet: Diet Order             Diet regular Fluid consistency: Thin  Diet effective now                     Antimicrobial agents: Anti-infectives (From admission, onward)    Start     Dose/Rate Route Frequency Ordered Stop   06/03/23 2200  azithromycin  (ZITHROMAX ) 500 mg in sodium chloride  0.9 % 250 mL IVPB        500 mg 250 mL/hr over 60 Minutes Intravenous Every 24 hours 06/03/23 1529 06/05/23 1515   06/03/23 1730  vancomycin  (VANCOCIN ) IVPB 1000 mg/200 mL premix  Status:  Discontinued        1,000 mg 200 mL/hr over 60 Minutes Intravenous Every 12 hours 06/03/23 1541 06/05/23 1407   06/03/23 1630  Ampicillin -Sulbactam (UNASYN ) 3 g in sodium chloride  0.9 % 100 mL IVPB        3 g 200 mL/hr over 30 Minutes Intravenous Every 6 hours 06/03/23 1541     06/03/23 0000  azithromycin  (ZITHROMAX ) 500 mg in sodium chloride  0.9 % 250 mL IVPB        500 mg 250 mL/hr over 60 Minutes Intravenous  Once 06/02/23 2351 06/03/23 0143   06/02/23 2345  cefTRIAXone  (ROCEPHIN ) 1 g in sodium chloride  0.9 % 100 mL IVPB        1 g 200 mL/hr over 30 Minutes Intravenous  Once 06/02/23 2342 06/03/23 0024   06/02/23 2345  metroNIDAZOLE  (FLAGYL ) IVPB 500 mg  Status:  Discontinued        500 mg 100 mL/hr over 60 Minutes Intravenous  Once 06/02/23 2342 06/02/23 2350        MEDICATIONS: Scheduled Meds:  feeding supplement  237 mL Oral BID BM   guaiFENesin   600 mg Oral BID   lidocaine   2 patch Transdermal Q24H   multivitamin with minerals  1 tablet Oral Daily   polyethylene glycol  17 g Oral BID   senna-docusate  2 tablet Oral QHS   sodium chloride  flush  10 mL Intrapleural Q8H   Continuous Infusions:  ampicillin -sulbactam (UNASYN ) IV 3 g (06/08/23 1030)   PRN Meds:.acetaminophen  **OR** acetaminophen , bisacodyl , levalbuterol , LORazepam , metoprolol  tartrate, morphine  injection, ondansetron  **OR** ondansetron  (ZOFRAN ) IV, oxyCODONE , sodium phosphate   I  have personally reviewed following labs and imaging studies  LABORATORY DATA: CBC: Recent Labs  Lab 06/02/23 1904 06/03/23 0223 06/03/23 1603 06/04/23 0803 06/05/23 0211 06/05/23 2005 06/06/23 0435 06/07/23 0445 06/08/23 0437  WBC 35.9*  --  31.2* 23.9* 32.5*  --  29.2* 17.6* 16.0*  NEUTROABS 31.6*  --  26.5*  --  25.0*  --   --  13.1*  --   HGB 13.3   < > 11.3* 10.6* 10.9* 11.1* 10.6* 9.6* 10.4*  HCT 38.7*   < > 32.8* 29.5* 31.5* 31.2* 31.2* 28.2* 31.1*  MCV 88.4  --  89.4 87.5 88.7  --  89.7 91.0 91.5  PLT 348  --  359 385 411*  --  514* 513* 734*   < > = values in this interval not displayed.    Basic Metabolic Panel: Recent Labs  Lab 06/02/23 1904 06/03/23 9776 06/04/23 0803 06/05/23 0211  06/06/23 0435 06/07/23 0445 06/08/23 0437  NA 133*   < > 131* 130* 131* 128* 130*  K 3.2*   < > 3.6 4.3 4.4 4.1 4.3  CL 98   < > 104 102 102 97* 97*  CO2 22   < > 18* 21* 22 24 26   GLUCOSE 107*   < > 147* 97 97 111* 90  BUN 37*   < > 17 15 12 6  <5*  CREATININE 0.92   < > 0.77 0.58* 0.70 0.47* 0.51*  CALCIUM 8.6*   < > 7.6* 7.3* 7.1* 7.4* 7.5*  MG 2.6*  --   --  1.8  --  1.6* 1.9  PHOS  --   --   --  5.5*  --  3.8 4.3   < > = values in this interval not displayed.    GFR: Estimated Creatinine Clearance: 110.2 mL/min (A) (by C-G formula based on SCr of 0.51 mg/dL (L)).  Liver Function Tests: Recent Labs  Lab 06/02/23 1904 06/03/23 1603 06/04/23 0803 06/05/23 0211 06/06/23 0435 06/07/23 0445  AST 18 16 15   --  17 18  ALT 13 12 10   --  11 11  ALKPHOS 260* 163* 128*  --  74 72  BILITOT 0.7 1.1 1.1  --  0.5 0.3  PROT 7.0 5.5* 5.1*  --  4.4* 4.7*  ALBUMIN 2.8* <1.5* <1.5* <1.5* <1.5* <1.5*   No results for input(s): LIPASE, AMYLASE in the last 168 hours. No results for input(s): AMMONIA in the last 168 hours.  Coagulation Profile: Recent Labs  Lab 06/03/23 1603  INR 1.2    Cardiac Enzymes: No results for input(s): CKTOTAL, CKMB, CKMBINDEX,  TROPONINI in the last 168 hours.  BNP (last 3 results) No results for input(s): PROBNP in the last 8760 hours.  Lipid Profile: No results for input(s): CHOL, HDL, LDLCALC, TRIG, CHOLHDL, LDLDIRECT in the last 72 hours.  Thyroid Function Tests: Recent Labs    06/07/23 0445  TSH 4.249    Anemia Panel: No results for input(s): VITAMINB12, FOLATE, FERRITIN, TIBC, IRON, RETICCTPCT in the last 72 hours.  Urine analysis:    Component Value Date/Time   COLORURINE YELLOW 06/02/2023 2216   APPEARANCEUR CLEAR 06/02/2023 2216   LABSPEC 1.021 06/02/2023 2216   PHURINE 6.0 06/02/2023 2216   GLUCOSEU NEGATIVE 06/02/2023 2216   HGBUR NEGATIVE 06/02/2023 2216   BILIRUBINUR NEGATIVE 06/02/2023 2216   KETONESUR NEGATIVE 06/02/2023 2216   PROTEINUR TRACE (A) 06/02/2023 2216   NITRITE NEGATIVE 06/02/2023 2216   LEUKOCYTESUR NEGATIVE 06/02/2023 2216    Sepsis Labs: Lactic Acid, Venous No results found for: LATICACIDVEN  MICROBIOLOGY: Recent Results (from the past 240 hours)  Resp panel by RT-PCR (RSV, Flu A&B, Covid) Anterior Nasal Swab     Status: None   Collection Time: 06/02/23  6:16 PM   Specimen: Anterior Nasal Swab  Result Value Ref Range Status   SARS Coronavirus 2 by RT PCR NEGATIVE NEGATIVE Final    Comment: (NOTE) SARS-CoV-2 target nucleic acids are NOT DETECTED.  The SARS-CoV-2 RNA is generally detectable in upper respiratory specimens during the acute phase of infection. The lowest concentration of SARS-CoV-2 viral copies this assay can detect is 138 copies/mL. A negative result does not preclude SARS-Cov-2 infection and should not be used as the sole basis for treatment or other patient management decisions. A negative result may occur with  improper specimen collection/handling, submission of specimen other than nasopharyngeal swab, presence of viral mutation(s) within  the areas targeted by this assay, and inadequate number of  viral copies(<138 copies/mL). A negative result must be combined with clinical observations, patient history, and epidemiological information. The expected result is Negative.  Fact Sheet for Patients:  bloggercourse.com  Fact Sheet for Healthcare Providers:  seriousbroker.it  This test is no t yet approved or cleared by the [place]  FDA and  has been authorized for detection and/or diagnosis of SARS-CoV-2 by FDA under an Emergency Use Authorization (EUA). This EUA will remain  in effect (meaning this test can be used) for the duration of the COVID-19 declaration under Section 564(b)(1) of the Act, 21 U.S.C.section 360bbb-3(b)(1), unless the authorization is terminated  or revoked sooner.       Influenza A by PCR NEGATIVE NEGATIVE Final   Influenza B by PCR NEGATIVE NEGATIVE Final    Comment: (NOTE) The Xpert Xpress SARS-CoV-2/FLU/RSV plus assay is intended as an aid in the diagnosis of influenza from Nasopharyngeal swab specimens and should not be used as a sole basis for treatment. Nasal washings and aspirates are unacceptable for Xpert Xpress SARS-CoV-2/FLU/RSV testing.  Fact Sheet for Patients: bloggercourse.com  Fact Sheet for Healthcare Providers: seriousbroker.it  This test is not yet approved or cleared by the [place]  FDA and has been authorized for detection and/or diagnosis of SARS-CoV-2 by FDA under an Emergency Use Authorization (EUA). This EUA will remain in effect (meaning this test can be used) for the duration of the COVID-19 declaration under Section 564(b)(1) of the Act, 21 U.S.C. section 360bbb-3(b)(1), unless the authorization is terminated or revoked.     Resp Syncytial Virus by PCR NEGATIVE NEGATIVE Final    Comment: (NOTE) Fact Sheet for Patients: bloggercourse.com  Fact Sheet for Healthcare  Providers: seriousbroker.it  This test is not yet approved or cleared by the [place]  FDA and has been authorized for detection and/or diagnosis of SARS-CoV-2 by FDA under an Emergency Use Authorization (EUA). This EUA will remain in effect (meaning this test can be used) for the duration of the COVID-19 declaration under Section 564(b)(1) of the Act, 21 U.S.C. section 360bbb-3(b)(1), unless the authorization is terminated or revoked.  Performed at [facility], [address] [place], [place] [address]   Culture, blood (Routine X 2) w Reflex to ID Panel     Status: None   Collection Time: 06/03/23  1:30 AM   Specimen: BLOOD LEFT FOREARM  Result Value Ref Range Status   Specimen Description   Final    BLOOD LEFT FOREARM Performed at [facility], [address] [place], [place] [address]    Special Requests   Final    BOTTLES DRAWN AEROBIC AND ANAEROBIC Blood Culture results may not be optimal due to an inadequate volume of blood received in culture bottles Performed at [facility], [address], [place], [place] [address]    Culture   Final    NO GROWTH 5 DAYS Performed at [facility], [address], [place], [place] [address]    Report Status 06/08/2023 FINAL  Final  Culture, blood (Routine X 2) w Reflex to ID Panel     Status: None   Collection Time: 06/03/23  1:39 AM   Specimen: BLOOD LEFT HAND  Result Value Ref Range Status   Specimen Description   Final    BLOOD LEFT HAND Performed at [facility], [address] [place], [place] [address]    Special Requests   Final    BOTTLES  DRAWN AEROBIC AND ANAEROBIC Blood Culture adequate volume Performed at [facility], [address], [place], [place] [address]    Culture   Final    NO GROWTH 5 DAYS Performed at [facility], [address] [place], [place] [address]     Report Status 06/08/2023 FINAL  Final  Expectorated Sputum Assessment w Gram Stain, Rflx to Resp Cult     Status: None   Collection Time: 06/03/23  4:48 AM   Specimen: Sputum  Result Value Ref Range Status   Specimen Description   Final    SPUTUM Performed at [facility], [address], [place], [place] [address]    Special Requests   Final    Normal Performed at [facility], [address], [place], [place] [address]    Sputum evaluation   Final    THIS SPECIMEN IS ACCEPTABLE FOR SPUTUM CULTURE Performed at [facility], [address], [place], [place] [address]    Report Status 06/03/2023 FINAL  Final  Culture, Respiratory w Gram Stain     Status: None   Collection Time: 06/03/23  4:48 AM   Specimen: SPU  Result Value Ref Range Status   Specimen Description SPUTUM  Final   Special Requests Normal Reflexed from [id number]  Final   Gram Stain   Final    MODERATE WBC PRESENT,BOTH PMN AND MONONUCLEAR FEW GRAM POSITIVE COCCI IN PAIRS FEW GRAM NEGATIVE RODS RARE YEAST WITH PSEUDOHYPHAE    Culture   Final    RARE STREPTOCOCCUS GROUP F Beta hemolytic streptococci are predictably susceptible to penicillin and other beta lactams. Susceptibility testing not routinely performed. WITHIN NORMAL RESPIRATORY FLORA Performed at [facility], [address], [place], [place] [address]    Report Status 06/06/2023 FINAL  Final  Body fluid culture w Gram Stain     Status: None   Collection Time: 06/03/23  4:35 PM   Specimen: Pleural Fluid  Result Value Ref Range Status   Specimen Description FLUID PLEURAL  Final   Special Requests NONE  Final   Gram Stain   Final    FEW WBC PRESENT,BOTH PMN AND MONONUCLEAR NO ORGANISMS SEEN    Culture   Final    NO GROWTH 3 DAYS Performed at [facility], [address], [place], [place] [address]    Report Status 06/07/2023 FINAL  Final  Fungus Culture With Stain     Status: None (Preliminary  result)   Collection Time: 06/03/23  4:35 PM   Specimen: Pleural, Left; Pleural Fluid  Result Value Ref Range Status   Fungus Stain Final report  Final    Comment: (NOTE) Performed At: [facility] [address] [place], [place] [id number] Jennette Shorter MD Ey:[id number]    Fungus (Mycology) Culture PENDING  Incomplete   Fungal Source FLUID  Final    Comment: PLEURAL Performed at [facility], [address] [place], [place] [address]   Fungus Culture Result     Status: None   Collection Time: 06/03/23  4:35 PM  Result Value Ref Range Status   Result 1 Comment  Final    Comment: (NOTE) KOH/Calcofluor preparation:  no fungus observed. Performed At: [facility] [address] [place], [place] [id number] Jennette Shorter MD Ey:[id number]   MRSA Next Gen by PCR, Nasal     Status: None   Collection Time: 06/04/23 12:20 AM   Specimen: Nasal Mucosa; Nasal Swab  Result  Value Ref Range Status   MRSA by PCR Next Gen NOT DETECTED NOT DETECTED Final    Comment: (NOTE) The GeneXpert MRSA Assay (FDA approved for NASAL specimens only), is one component of a comprehensive MRSA colonization surveillance program. It is not intended to diagnose MRSA infection nor to guide or monitor treatment for MRSA infections. Test performance is not FDA approved in patients less than 24 years old. Performed at [facility], [address] [place], [place] [address]   Culture, blood (Routine X 2) w Reflex to ID Panel     Status: None (Preliminary result)   Collection Time: 06/06/23  7:46 AM   Specimen: BLOOD RIGHT HAND  Result Value Ref Range Status   Specimen Description BLOOD RIGHT HAND  Final   Special Requests   Final    BOTTLES DRAWN AEROBIC AND ANAEROBIC Blood Culture results may not be optimal due to an inadequate volume of blood received in culture bottles   Culture   Final    NO GROWTH 2 DAYS Performed at [facility], [address], [place], [place]  [address]    Report Status PENDING  Incomplete  Culture, blood (Routine X 2) w Reflex to ID Panel     Status: None (Preliminary result)   Collection Time: 06/06/23  7:46 AM   Specimen: BLOOD LEFT ARM  Result Value Ref Range Status   Specimen Description BLOOD LEFT ARM  Final   Special Requests   Final    BOTTLES DRAWN AEROBIC AND ANAEROBIC Blood Culture results may not be optimal due to an inadequate volume of blood received in culture bottles   Culture   Final    NO GROWTH 2 DAYS Performed at [facility], [address], [place], [place] [address]    Report Status PENDING  Incomplete    RADIOLOGY STUDIES/RESULTS: CT [facility] PLEURAL DRAIN W/INDWELL CATH W/IMG GUIDE Result Date: 06/07/2023 INDICATION: 33 year old male with history of complex left empyema. EXAM: CT PERC PLEURAL DRAIN W/INDWELL CATH W/IMG GUIDE COMPARISON:  None Available. MEDICATIONS: The patient is currently admitted to the hospital and receiving intravenous antibiotics. The antibiotics were administered within an appropriate time frame prior to the initiation of the procedure. ANESTHESIA/SEDATION: Moderate (conscious) sedation was employed during this procedure. A total of Versed  1 mg and Fentanyl  50 mcg was administered intravenously. Moderate Sedation Time: 10 minutes. The patient's level of consciousness and vital signs were monitored continuously by radiology nursing throughout the procedure under my direct supervision. CONTRAST:  None COMPLICATIONS: None immediate. PROCEDURE: RADIATION DOSE REDUCTION: This exam was performed according to the departmental dose-optimization program which includes automated exposure control, adjustment of the mA and/or kV according to patient size and/or use of iterative reconstruction technique. Informed written consent was obtained from the patient after a discussion of the risks, benefits and alternatives to treatment. The patient was placed supine, partially right lateral decubitus on the CT  gantry and a pre procedural CT was performed re-demonstrating the known fluid collection within the left pleural space. The procedure was planned. A timeout was performed prior to the initiation of the procedure. The left posterolateral and inferior thorax was prepped and draped in the usual sterile fashion. The overlying soft tissues were anesthetized with 1% lidocaine  with epinephrine . Appropriate trajectory was planned with the use of a 22 gauge spinal needle. An 18 gauge trocar needle was advanced into the pleural space and a short Amplatz super stiff wire was coiled within the collection. Appropriate  positioning was confirmed with a limited CT scan. The tract was serially dilated allowing placement of a 14 French all-purpose drainage catheter. Appropriate positioning was confirmed with a limited postprocedural CT scan. The tube was connected to a pleurovac and sutured in place. A dressing was placed. The patient tolerated the procedure well without immediate post procedural complication. IMPRESSION: Successful CT guided placement of a 78 French all purpose drain catheter into the posterolateral and inferior left pleural space with aspiration of serous fluid. Ester Sides, MD [facility] Radiology Electronically Signed   By: Ester Sides M.D.   On: 06/07/2023 16:53   CT MAXILLOFACIAL W CONTRAST Result Date: 06/06/2023 CLINICAL DATA:  Dental caries EXAM: CT MAXILLOFACIAL WITH CONTRAST TECHNIQUE: Multidetector CT imaging of the maxillofacial structures was performed with intravenous contrast. Multiplanar CT image reconstructions were also generated. RADIATION DOSE REDUCTION: This exam was performed according to the departmental dose-optimization program which includes automated exposure control, adjustment of the mA and/or kV according to patient size and/or use of iterative reconstruction technique. CONTRAST:  75mL OMNIPAQUE  IOHEXOL  350 MG/ML SOLN COMPARISON:   None Available. FINDINGS: Osseous: No traumatic facial bone finding. Dental: Unerupted right maxillary tooth 1. Multiple caries evident affecting the anterior maxillary dentition from tooth 4 through tooth 12. Lucency around the roots particularly at teeth 8, 9 and 10. With respect to the mandibular dentition, there is advanced decay with multiple caries. Unfortunately there is a good bit of motion degradation. Pronounced lucency adjacent to the roots of the left mandibular dentition with a confluent root abscess in the right mandible affecting teeth 30 and 31. Orbits: Normal Sinuses: Clear Soft tissues: Negative Limited intracranial: Normal IMPRESSION: 1. Advanced dental and periodontal disease as outlined above. Electronically Signed   By: Oneil Officer M.D.   On: 06/06/2023 22:39   CT CHEST WO CONTRAST Result Date: 06/06/2023 CLINICAL DATA:  Pneumonia complication EXAM: CT CHEST WITHOUT CONTRAST TECHNIQUE: Multidetector CT imaging of the chest was performed following the standard protocol without IV contrast. RADIATION DOSE REDUCTION: This exam was performed according to the departmental dose-optimization program which includes automated exposure control, adjustment of the mA and/or kV according to patient size and/or use of iterative reconstruction technique. COMPARISON:  CT abdomen 06/02/2023 FINDINGS: Cardiovascular: Heart size normal. Blood pool is hypodense compared to the interventricular septum suggesting anemia. Trace pericardial fluid. Central great vessels unremarkable. Mediastinum/Nodes: Anterior mediastinal soft tissue density presumably residual thymic tissue. No definite mass or adenopathy, sensitivity decreased without contrast. Lungs/Pleura: Progressive moderate partially loculated left pleural effusion despite placement of pigtail chest tube anteromedially at the lung base. Scattered loculated pleural gas bubbles may be related to chest tube placement versus infection. Persistent dense  consolidation of the left lower lobe. Worsening dependent consolidation/atelectasis in the lingula and left upper lobe. Small right pleural effusion has developed. Consolidation with air bronchograms posteriorly in the right lower lobe. Subsegmental consolidation/atelectasis anteromedially in the right upper lobe. Upper Abdomen: No acute findings. Musculoskeletal: No chest wall mass or suspicious bone lesions identified. IMPRESSION: 1. Progressive moderate partially loculated left pleural effusion despite placement of pigtail chest tube. 2. Worsening dependent consolidation/atelectasis in the lingula and left upper lobe. 3. Persistent dense consolidation of the left lower lobe. 4. New small right pleural effusion. 5. New consolidation/atelectasis posteriorly in the right lower lobe. Electronically Signed   By: JONETTA Faes M.D.   On: 06/06/2023 16:52     LOS: 5 days   Donalda Applebaum, MD  [facility]  To contact the attending provider between 7A-7P or the covering provider during after hours 7P-7A, please log into the web site [email or web address] and access using universal [place] password for that web site. If you do not have the password, please call the hospital operator.  06/08/2023, 11:31 AM

## 2023-06-08 NOTE — Progress Notes (Signed)
 Referring Provider(s): Dr. JAYSON Staff, MD  Supervising Physician: Jennefer Rover  Patient Status:  [facility] - In-pt  Chief Complaint:  Sepsis with left lower lobe pneumonia with empyema  Subjective:  Patient laying comfortably in bed.  Patient states he is much improved since placement of new chest tube yesterday. No new concerns. Patient denies fevers/chills, nausea/vomiting, chest pains, shortness of breath.    Allergies: Tylenol  [acetaminophen ] and Vicodin [hydrocodone-acetaminophen ]  Medications: Prior to Admission medications   Medication Sig Start Date End Date Taking? Authorizing Provider  acetaminophen  (TYLENOL ) 500 MG tablet Take 1,000 mg by mouth every 6 (six) hours as needed for mild pain.   Yes [provider]  naproxen  (NAPROSYN ) 500 MG tablet Take 1 tablet (500 mg total) by mouth 2 (two) times daily with a meal. As needed for pain 09/04/20  Yes Randol Simmonds, MD     Vital Signs: BP 97/63 (BP Location: Right Arm)   Pulse (!) 103   Temp 97.8 F (36.6 C) (Oral)   Resp 20   Ht 5' 8 (1.727 m)   Wt 132 lb 0.9 oz (59.9 kg)   SpO2 95%   BMI 20.08 kg/m   Physical Exam Constitutional:      Appearance: He is ill-appearing.  Pulmonary:     Effort: Pulmonary effort is normal.  Skin:    General: Skin is warm and dry.  Neurological:     Mental Status: He is alert and oriented to person, place, and time.      Labs:  CBC: Recent Labs    06/05/23 0211 06/05/23 2005 06/06/23 0435 06/07/23 0445 06/08/23 0437  WBC 32.5*  --  29.2* 17.6* 16.0*  HGB 10.9* 11.1* 10.6* 9.6* 10.4*  HCT 31.5* 31.2* 31.2* 28.2* 31.1*  PLT 411*  --  514* 513* 734*    COAGS: Recent Labs    06/03/23 1603  INR 1.2    BMP: Recent Labs    06/05/23 0211 06/06/23 0435 06/07/23 0445 06/08/23 0437  NA 130* 131* 128* 130*  K 4.3 4.4 4.1 4.3  CL 102 102 97* 97*  CO2 21* 22 24 26   GLUCOSE 97 97 111* 90  BUN 15 12 6  <5*  CALCIUM 7.3* 7.1* 7.4* 7.5*  CREATININE  0.58* 0.70 0.47* 0.51*  GFRNONAA >60 >60 >60 >60    LIVER FUNCTION TESTS: Recent Labs    06/03/23 1603 06/04/23 0803 06/05/23 0211 06/06/23 0435 06/07/23 0445  BILITOT 1.1 1.1  --  0.5 0.3  AST 16 15  --  17 18  ALT 12 10  --  11 11  ALKPHOS 163* 128*  --  74 72  PROT 5.5* 5.1*  --  4.4* 4.7*  ALBUMIN <1.5* <1.5* <1.5* <1.5* <1.5*    Assessment and Plan:  Drain Location: Left posterior thorax, midaxillary Size: Fr size: 14 Fr Date of placement: 06/07/23  Currently to: Drain collection device: Pleurevac 24 hour output:  Output by Drain (mL) 06/06/23 0701 - 06/06/23 1900 06/06/23 1901 - 06/07/23 0700 06/07/23 0701 - 06/07/23 1900 06/07/23 1901 - 06/08/23 0700 06/08/23 0701 - 06/08/23 1547  Requested LDAs do not have output data documented.    Interval imaging/drain manipulation:  none  Current examination: Patient was initially amenable to physical examination. However, patient states he experienced severe pain on attempt to palpate around the dressing, and angrily asked this provider to leave. Patient refused request to allow an examination of the insertion site under the dressing. Unable to assess insertion site,  nor presence of concerning findings.  Site appears to be dressed appropriately, and dressing appears dry and clean. Serosanguinous output into cassette. Per nursing staff, drain flushes well.   Plan: Record output Q shift. Dressing changes QD or PRN if soiled.  Call IR APP or on call IR MD if difficulty flushing or sudden change in drain output.  Repeat imaging/possible drain injection once output < 10 mL/QD (excluding flush material). Consideration for drain removal if output is < 10 mL/QD (excluding flush material), pending discussion with the providing surgical service.   IR will continue to follow - please call with questions or concerns.    Electronically Signed: Carlin DELENA Griffon, PA-C 06/08/2023, 3:47 PM     I spent a total of 15 Minutes at the the  patient's bedside AND on the patient's hospital floor or unit, greater than 50% of which was counseling/coordinating care for left thoracostomy tube.

## 2023-06-09 ENCOUNTER — Inpatient Hospital Stay (HOSPITAL_COMMUNITY): Payer: MEDICAID

## 2023-06-09 LAB — BASIC METABOLIC PANEL
Anion gap: 8 (ref 5–15)
BUN: 5 mg/dL — ABNORMAL LOW (ref 6–20)
CO2: 26 mmol/L (ref 22–32)
Calcium: 8 mg/dL — ABNORMAL LOW (ref 8.9–10.3)
Chloride: 95 mmol/L — ABNORMAL LOW (ref 98–111)
Creatinine, Ser: 0.63 mg/dL (ref 0.61–1.24)
GFR, Estimated: >60 mL/min (ref >60)
Glucose, Bld: 94 mg/dL (ref 70–99)
Potassium: 4.6 mmol/L (ref 3.5–5.1)
Sodium: 129 mmol/L — ABNORMAL LOW (ref 135–145)

## 2023-06-09 LAB — CBC
HCT: 28.9 % — ABNORMAL LOW (ref 39.0–52.0)
Hemoglobin: 9.7 g/dL — ABNORMAL LOW (ref 13.0–17.0)
MCH: 30.8 pg (ref 26.0–34.0)
MCHC: 33.6 g/dL (ref 30.0–36.0)
MCV: 91.7 fL (ref 80.0–100.0)
Platelets: 831 10*3/uL — ABNORMAL HIGH (ref 150–400)
RBC: 3.15 MIL/uL — ABNORMAL LOW (ref 4.22–5.81)
RDW: 14.1 % (ref 11.5–15.5)
WBC: 14.1 10*3/uL — ABNORMAL HIGH (ref 4.0–10.5)
nRBC: 0 % (ref 0.0–0.2)

## 2023-06-09 MED ORDER — BISACODYL 10 MG RE SUPP
10.0000 mg | Freq: Once | RECTAL | Status: DC
  Filled 2023-06-09: qty 1

## 2023-06-09 NOTE — Progress Notes (Addendum)
 PROGRESS NOTE        PATIENT DETAILS Name: Brent Bryant Age: 33 y.o. Sex: male Date of Birth: 25-Jan-1991 Admit Date: 06/02/2023 Admitting Physician [name], MD ERE:[name], [name], [name]  Brief Summary: Patient is a 33 y.o.  male with history polysubstance abuse, chronic HCV-who presented with severe left-sided chest pain/flank pain-found to have sepsis secondary to left lower lobe pneumonia with empyema  Significant events: 2/01>> admit to [facility] consult-chest tube placed (pleural fluid-LDH 1174, WBC 7700) 2/2>> intrapleural lytics 2/3>> intrapleural lytics 2/4>> CT chest: Worsening left pleural effusion 2/5>> second chest tube placed by IR 2/6>> intrapleural lytics  Significant studies: 1/31>> CT abdomen/pelvis: Loculated pleural effusion-hide left lower PNA. 2/02>> TTE: EF 65-70% 2/04>> CT chest: Worsening loculated left pleural effusion despite placement of pigtail 2/04>> CT maxillofacial: Advanced dental/periodontal disease.  Significant microbiology data: 1/31>> COVID/influenza/RSV PCR: Negative 2/01>> pleural fluid culture: No growth 2/01>> sputum culture: rare strep group F 2/01>> blood culture: No growth 2/04>> blood culture: No growth  Procedures: 2/01>> chest tube placement by PCCM 2/05>> second chest tube placement by IR  Consults: PCCM ID  Subjective: Constipated-no BM since admission-no other complaints.  Looks much more awake and alert  Objective: Vitals: Blood pressure 110/71, pulse (!) 116, temperature 97.6 F (36.4 C), temperature source Oral, resp. rate (!) 26, height 5' 8 (1.727 m), weight 59.9 kg, SpO2 91%.   Exam: Gen Exam:Alert awake-not in any distress HEENT:atraumatic, normocephalic Chest: B/L clear to auscultation anteriorly CVS:S1S2 regular Abdomen:soft non tender, non distended Extremities:no edema Neurology: Non focal Skin: no rash  Pertinent Labs/Radiology:    Latest Ref Rng & Units 06/09/2023     5:46 AM 06/08/2023    4:37 AM 06/07/2023    4:45 AM  CBC  WBC 4.0 - 10.5 K/uL 14.1  16.0  17.6   Hemoglobin 13.0 - 17.0 g/dL 9.7  89.5  9.6   Hematocrit 39.0 - 52.0 % 28.9  31.1  28.2   Platelets 150 - 400 K/uL 831  734  513     Lab Results  Component Value Date   NA 129 (L) 06/09/2023   K 4.6 06/09/2023   CL 95 (L) 06/09/2023   CO2 26 06/09/2023    Assessment/Plan: Sepsis secondary to left lower lobe PNA with empyema-s/p chest tube placement by PCCM on 2/1 Sepsis physiology continues to improve First chest tube placed by PCCM on 2/1-received lytics on 2/2 and 2/3.  Unfortunately pleural effusion continued to enlarge-requiring second chest tube placement on 2/5.  Received lytics  again on 2/6.   Remains on Unasyn  PCCM/ID following-await further recommendations with  Dental caries/poor oral hygiene Outpatient dentistry follow-up-no inpatient dentist coverage at [facility].  Chronic HCV Outpatient treatment if patient follows up in the ID clinic.  Normocytic anemia Secondary to acute illness Follow CBC  Hyponatremia Mild Asymptomatic Repeat electrolytes tomorrow  History of polysubstance abuse-UDS positive for opiates/cocaine/THC Per patient-he has been clean for more than 6 months--suspects that his UDS is positive as he was exposed to cocaine that his brother was using in his apartment.  Underweight: Cachectic/emaciated-will get nutrition evaluation HIV negative TSH stable  Constipation No response to MiraLAX /senna Dulcolax suppository x 1 today.  Nutrition Status: Nutrition Problem: Severe Malnutrition Etiology: social / environmental circumstances Signs/Symptoms: severe muscle depletion, severe fat depletion Interventions: MVI, Hormel Shake    Estimated body mass index  is 20.08 kg/m as calculated from the following:   Height as of this encounter: 5' 8 (1.727 m).   Weight as of this encounter: 59.9 kg.   Code status:   Code Status: Full Code   DVT  Prophylaxis: Place and maintain sequential compression device Start: 06/05/23 2016   Family Communication: None at bedside   Disposition Plan: Status is: Inpatient Remains inpatient appropriate because:    Planned Discharge Destination:Home   Diet: Diet Order             Diet regular Fluid consistency: Thin  Diet effective now                     Antimicrobial agents: Anti-infectives (From admission, onward)    Start     Dose/Rate Route Frequency Ordered Stop   06/03/23 2200  azithromycin  (ZITHROMAX ) 500 mg in sodium chloride  0.9 % 250 mL IVPB        500 mg 250 mL/hr over 60 Minutes Intravenous Every 24 hours 06/03/23 1529 06/05/23 1515   06/03/23 1730  vancomycin  (VANCOCIN ) IVPB 1000 mg/200 mL premix  Status:  Discontinued        1,000 mg 200 mL/hr over 60 Minutes Intravenous Every 12 hours 06/03/23 1541 06/05/23 1407   06/03/23 1630  Ampicillin -Sulbactam (UNASYN ) 3 g in sodium chloride  0.9 % 100 mL IVPB        3 g 200 mL/hr over 30 Minutes Intravenous Every 6 hours 06/03/23 1541     06/03/23 0000  azithromycin  (ZITHROMAX ) 500 mg in sodium chloride  0.9 % 250 mL IVPB        500 mg 250 mL/hr over 60 Minutes Intravenous  Once 06/02/23 2351 06/03/23 0143   06/02/23 2345  cefTRIAXone  (ROCEPHIN ) 1 g in sodium chloride  0.9 % 100 mL IVPB        1 g 200 mL/hr over 30 Minutes Intravenous  Once 06/02/23 2342 06/03/23 0024   06/02/23 2345  metroNIDAZOLE  (FLAGYL ) IVPB 500 mg  Status:  Discontinued        500 mg 100 mL/hr over 60 Minutes Intravenous  Once 06/02/23 2342 06/02/23 2350        MEDICATIONS: Scheduled Meds:  feeding supplement  237 mL Oral BID BM   guaiFENesin   600 mg Oral BID   lidocaine   2 patch Transdermal Q24H   multivitamin with minerals  1 tablet Oral Daily   polyethylene glycol  17 g Oral BID   senna-docusate  2 tablet Oral QHS   sodium chloride  flush  10 mL Intrapleural Q8H   Continuous Infusions:  ampicillin -sulbactam (UNASYN ) IV 3 g (06/09/23  0921)   PRN Meds:.acetaminophen  **OR** acetaminophen , bisacodyl , levalbuterol , LORazepam , metoprolol  tartrate, morphine  injection, ondansetron  **OR** ondansetron  (ZOFRAN ) IV, oxyCODONE , sodium phosphate   I have personally reviewed following labs and imaging studies  LABORATORY DATA: CBC: Recent Labs  Lab 06/02/23 1904 06/03/23 0223 06/03/23 1603 06/04/23 0803 06/05/23 0211 06/05/23 2005 06/06/23 0435 06/07/23 0445 06/08/23 0437 06/09/23 0546  WBC 35.9*  --  31.2*   < > 32.5*  --  29.2* 17.6* 16.0* 14.1*  NEUTROABS 31.6*  --  26.5*  --  25.0*  --   --  13.1*  --   --   HGB 13.3   < > 11.3*   < > 10.9* 11.1* 10.6* 9.6* 10.4* 9.7*  HCT 38.7*   < > 32.8*   < > 31.5* 31.2* 31.2* 28.2* 31.1* 28.9*  MCV 88.4  --  89.4   < >  88.7  --  89.7 91.0 91.5 91.7  PLT 348  --  359   < > 411*  --  514* 513* 734* 831*   < > = values in this interval not displayed.    Basic Metabolic Panel: Recent Labs  Lab 06/02/23 1904 06/03/23 0223 06/05/23 0211 06/06/23 0435 06/07/23 0445 06/08/23 0437 06/09/23 0546  NA 133*   < > 130* 131* 128* 130* 129*  K 3.2*   < > 4.3 4.4 4.1 4.3 4.6  CL 98   < > 102 102 97* 97* 95*  CO2 22   < > 21* 22 24 26 26   GLUCOSE 107*   < > 97 97 111* 90 94  BUN 37*   < > 15 12 6  <5* 5*  CREATININE 0.92   < > 0.58* 0.70 0.47* 0.51* 0.63  CALCIUM 8.6*   < > 7.3* 7.1* 7.4* 7.5* 8.0*  MG 2.6*  --  1.8  --  1.6* 1.9  --   PHOS  --   --  5.5*  --  3.8 4.3  --    < > = values in this interval not displayed.    GFR: Estimated Creatinine Clearance: 110.2 mL/min (by C-G formula based on SCr of 0.63 mg/dL).  Liver Function Tests: Recent Labs  Lab 06/02/23 1904 06/03/23 1603 06/04/23 0803 06/05/23 0211 06/06/23 0435 06/07/23 0445  AST 18 16 15   --  17 18  ALT 13 12 10   --  11 11  ALKPHOS 260* 163* 128*  --  74 72  BILITOT 0.7 1.1 1.1  --  0.5 0.3  PROT 7.0 5.5* 5.1*  --  4.4* 4.7*  ALBUMIN 2.8* <1.5* <1.5* <1.5* <1.5* <1.5*   No results for input(s):  LIPASE, AMYLASE in the last 168 hours. No results for input(s): AMMONIA in the last 168 hours.  Coagulation Profile: Recent Labs  Lab 06/03/23 1603  INR 1.2    Cardiac Enzymes: No results for input(s): CKTOTAL, CKMB, CKMBINDEX, TROPONINI in the last 168 hours.  BNP (last 3 results) No results for input(s): PROBNP in the last 8760 hours.  Lipid Profile: No results for input(s): CHOL, HDL, LDLCALC, TRIG, CHOLHDL, LDLDIRECT in the last 72 hours.  Thyroid Function Tests: Recent Labs    06/07/23 0445  TSH 4.249    Anemia Panel: No results for input(s): VITAMINB12, FOLATE, FERRITIN, TIBC, IRON, RETICCTPCT in the last 72 hours.  Urine analysis:    Component Value Date/Time   COLORURINE YELLOW 06/02/2023 2216   APPEARANCEUR CLEAR 06/02/2023 2216   LABSPEC 1.021 06/02/2023 2216   PHURINE 6.0 06/02/2023 2216   GLUCOSEU NEGATIVE 06/02/2023 2216   HGBUR NEGATIVE 06/02/2023 2216   BILIRUBINUR NEGATIVE 06/02/2023 2216   KETONESUR NEGATIVE 06/02/2023 2216   PROTEINUR TRACE (A) 06/02/2023 2216   NITRITE NEGATIVE 06/02/2023 2216   LEUKOCYTESUR NEGATIVE 06/02/2023 2216    Sepsis Labs: Lactic Acid, Venous No results found for: LATICACIDVEN  MICROBIOLOGY: Recent Results (from the past 240 hours)  Resp panel by RT-PCR (RSV, Flu A&B, Covid) Anterior Nasal Swab     Status: None   Collection Time: 06/02/23  6:16 PM   Specimen: Anterior Nasal Swab  Result Value Ref Range Status   SARS Coronavirus 2 by RT PCR NEGATIVE NEGATIVE Final    Comment: (NOTE) SARS-CoV-2 target nucleic acids are NOT DETECTED.  The SARS-CoV-2 RNA is generally detectable in upper respiratory specimens during the acute phase of infection. The lowest concentration of SARS-CoV-2 viral  copies this assay can detect is 138 copies/mL. A negative result does not preclude SARS-Cov-2 infection and should not be used as the sole basis for treatment or other patient management  decisions. A negative result may occur with  improper specimen collection/handling, submission of specimen other than nasopharyngeal swab, presence of viral mutation(s) within the areas targeted by this assay, and inadequate number of viral copies(<138 copies/mL). A negative result must be combined with clinical observations, patient history, and epidemiological information. The expected result is Negative.  Fact Sheet for Patients:  bloggercourse.com  Fact Sheet for Healthcare Providers:  seriousbroker.it  This test is no t yet approved or cleared by the [place]  FDA and  has been authorized for detection and/or diagnosis of SARS-CoV-2 by FDA under an Emergency Use Authorization (EUA). This EUA will remain  in effect (meaning this test can be used) for the duration of the COVID-19 declaration under Section 564(b)(1) of the Act, 21 U.S.C.section 360bbb-3(b)(1), unless the authorization is terminated  or revoked sooner.       Influenza A by PCR NEGATIVE NEGATIVE Final   Influenza B by PCR NEGATIVE NEGATIVE Final    Comment: (NOTE) The Xpert Xpress SARS-CoV-2/FLU/RSV plus assay is intended as an aid in the diagnosis of influenza from Nasopharyngeal swab specimens and should not be used as a sole basis for treatment. Nasal washings and aspirates are unacceptable for Xpert Xpress SARS-CoV-2/FLU/RSV testing.  Fact Sheet for Patients: bloggercourse.com  Fact Sheet for Healthcare Providers: seriousbroker.it  This test is not yet approved or cleared by the [place]  FDA and has been authorized for detection and/or diagnosis of SARS-CoV-2 by FDA under an Emergency Use Authorization (EUA). This EUA will remain in effect (meaning this test can be used) for the duration of the COVID-19 declaration under Section 564(b)(1) of the Act, 21 U.S.C. section 360bbb-3(b)(1), unless the  authorization is terminated or revoked.     Resp Syncytial Virus by PCR NEGATIVE NEGATIVE Final    Comment: (NOTE) Fact Sheet for Patients: bloggercourse.com  Fact Sheet for Healthcare Providers: seriousbroker.it  This test is not yet approved or cleared by the [place]  FDA and has been authorized for detection and/or diagnosis of SARS-CoV-2 by FDA under an Emergency Use Authorization (EUA). This EUA will remain in effect (meaning this test can be used) for the duration of the COVID-19 declaration under Section 564(b)(1) of the Act, 21 U.S.C. section 360bbb-3(b)(1), unless the authorization is terminated or revoked.  Performed at [facility], [address] [place], [place] [address]   Culture, blood (Routine X 2) w Reflex to ID Panel     Status: None   Collection Time: 06/03/23  1:30 AM   Specimen: BLOOD LEFT FOREARM  Result Value Ref Range Status   Specimen Description   Final    BLOOD LEFT FOREARM Performed at [facility], [address] [place], [place] [address]    Special Requests   Final    BOTTLES DRAWN AEROBIC AND ANAEROBIC Blood Culture results may not be optimal due to an inadequate volume of blood received in culture bottles Performed at [facility], [address] [place], [place] [address]    Culture   Final    NO GROWTH 5 DAYS Performed at [facility], [address] [place], [place] [address]    Report Status 06/08/2023 FINAL  Final  Culture, blood (Routine X 2) w Reflex to ID Panel     Status: None   Collection  Time: 06/03/23  1:39 AM   Specimen: BLOOD LEFT HAND  Result Value Ref Range Status   Specimen Description   Final    BLOOD LEFT HAND Performed at [facility], [address], [place], [place] [address]    Special Requests   Final    BOTTLES DRAWN AEROBIC AND ANAEROBIC Blood Culture adequate  volume Performed at [facility], [address] [place], [place] [address]    Culture   Final    NO GROWTH 5 DAYS Performed at [facility], [address], [place], [place] [address]    Report Status 06/08/2023 FINAL  Final  Expectorated Sputum Assessment w Gram Stain, Rflx to Resp Cult     Status: None   Collection Time: 06/03/23  4:48 AM   Specimen: Sputum  Result Value Ref Range Status   Specimen Description   Final    SPUTUM Performed at [facility], [address] [place], [place] [address]    Special Requests   Final    Normal Performed at [facility], [address], [place], [place] [address]    Sputum evaluation   Final    THIS SPECIMEN IS ACCEPTABLE FOR SPUTUM CULTURE Performed at [facility], [address] [place], [place] [address]    Report Status 06/03/2023 FINAL  Final  Culture, Respiratory w Gram Stain     Status: None   Collection Time: 06/03/23  4:48 AM   Specimen: SPU  Result Value Ref Range Status   Specimen Description SPUTUM  Final   Special Requests Normal Reflexed from [id number]  Final   Gram Stain   Final    MODERATE WBC PRESENT,BOTH PMN AND MONONUCLEAR FEW GRAM POSITIVE COCCI IN PAIRS FEW GRAM NEGATIVE RODS RARE YEAST WITH PSEUDOHYPHAE    Culture   Final    RARE STREPTOCOCCUS GROUP F Beta hemolytic streptococci are predictably susceptible to penicillin and other beta lactams. Susceptibility testing not routinely performed. WITHIN NORMAL RESPIRATORY FLORA Performed at [facility], [address] [place], [place] [address]    Report Status 06/06/2023 FINAL  Final  Body fluid culture w Gram Stain     Status: None   Collection Time: 06/03/23  4:35 PM   Specimen: Pleural Fluid  Result Value Ref Range Status   Specimen Description FLUID PLEURAL  Final   Special Requests NONE  Final   Gram Stain   Final    FEW WBC PRESENT,BOTH PMN AND MONONUCLEAR NO ORGANISMS  SEEN    Culture   Final    NO GROWTH 3 DAYS Performed at [facility], [address], [place], [place] [address]    Report Status 06/07/2023 FINAL  Final  Fungus Culture With Stain     Status: None (Preliminary result)   Collection Time: 06/03/23  4:35 PM   Specimen: Pleural, Left; Pleural Fluid  Result Value Ref Range Status   Fungus Stain Final report  Final    Comment: (NOTE) Performed At: [facility] [address] [place], [place] [id number] Jennette Shorter MD Ey:[id number]    Fungus (Mycology) Culture PENDING  Incomplete   Fungal Source FLUID  Final    Comment: PLEURAL Performed at [facility], [address], [place], [place] [address]   Fungus Culture Result     Status: None   Collection Time: 06/03/23  4:35 PM  Result Value Ref Range Status   Result 1 Comment  Final  Comment: (NOTE) KOH/Calcofluor preparation:  no fungus observed. Performed At: [facility] [address] [place], [place] [id number] Jennette Shorter MD Ey:[id number]   MRSA Next Gen by PCR, Nasal     Status: None   Collection Time: 06/04/23 12:20 AM   Specimen: Nasal Mucosa; Nasal Swab  Result Value Ref Range Status   MRSA by PCR Next Gen NOT DETECTED NOT DETECTED Final    Comment: (NOTE) The GeneXpert MRSA Assay (FDA approved for NASAL specimens only), is one component of a comprehensive MRSA colonization surveillance program. It is not intended to diagnose MRSA infection nor to guide or monitor treatment for MRSA infections. Test performance is not FDA approved in patients less than 39 years old. Performed at [facility], [address] [place], [place] [address]   Culture, blood (Routine X 2) w Reflex to ID Panel     Status: None (Preliminary result)   Collection Time: 06/06/23  7:46 AM   Specimen: BLOOD RIGHT HAND  Result Value Ref Range Status   Specimen Description BLOOD RIGHT HAND  Final   Special Requests   Final    BOTTLES DRAWN AEROBIC AND  ANAEROBIC Blood Culture results may not be optimal due to an inadequate volume of blood received in culture bottles   Culture   Final    NO GROWTH 3 DAYS Performed at [facility], [address] [place], [place] [address]    Report Status PENDING  Incomplete  Culture, blood (Routine X 2) w Reflex to ID Panel     Status: None (Preliminary result)   Collection Time: 06/06/23  7:46 AM   Specimen: BLOOD LEFT ARM  Result Value Ref Range Status   Specimen Description BLOOD LEFT ARM  Final   Special Requests   Final    BOTTLES DRAWN AEROBIC AND ANAEROBIC Blood Culture results may not be optimal due to an inadequate volume of blood received in culture bottles   Culture   Final    NO GROWTH 3 DAYS Performed at [facility], [address], [place], [place] [address]    Report Status PENDING  Incomplete    RADIOLOGY STUDIES/RESULTS: DG Chest Port 1 View Result Date: 06/08/2023 CLINICAL DATA:  Empyema. EXAM: PORTABLE CHEST 1 VIEW COMPARISON:  06/06/2023. FINDINGS: Redemonstration of heterogeneous opacification of the left hemithorax, overall decreased in density since the prior study, suggesting interval decrease in the loculated left pleural effusion, likely status post additional left pleural drainage placement. No pneumothorax. There also superimposed areas of atelectasis and or pneumonia. Please refer to recent chest CT scan for additional details. There are 2 left-sided pleural drainage catheters. Right lung is grossly clear. No dense consolidation or lung collapse. Right lateral costophrenic angle is clear. No pneumothorax. Stable cardio-mediastinal silhouette. No acute osseous abnormalities. The soft tissues are within normal limits. IMPRESSION: *Interval decrease in the loculated left pleural effusion, status post left-sided additional pleural drainage catheter placement. In total, there are now 2 left-sided pleural drainage catheters. No pneumothorax seen. Electronically Signed   By:  Ree Molt M.D.   On: 06/08/2023 15:53   CT [facility] PLEURAL DRAIN W/INDWELL CATH W/IMG GUIDE Result Date: 06/07/2023 INDICATION: 33 year old male with history of complex left empyema. EXAM: CT PERC PLEURAL DRAIN W/INDWELL CATH W/IMG GUIDE COMPARISON:  None Available. MEDICATIONS: The patient is currently admitted to the hospital and receiving intravenous antibiotics. The antibiotics were administered within an appropriate time frame prior to the initiation of the procedure. ANESTHESIA/SEDATION: Moderate (  conscious) sedation was employed during this procedure. A total of Versed  1 mg and Fentanyl  50 mcg was administered intravenously. Moderate Sedation Time: 10 minutes. The patient's level of consciousness and vital signs were monitored continuously by radiology nursing throughout the procedure under my direct supervision. CONTRAST:  None COMPLICATIONS: None immediate. PROCEDURE: RADIATION DOSE REDUCTION: This exam was performed according to the departmental dose-optimization program which includes automated exposure control, adjustment of the mA and/or kV according to patient size and/or use of iterative reconstruction technique. Informed written consent was obtained from the patient after a discussion of the risks, benefits and alternatives to treatment. The patient was placed supine, partially right lateral decubitus on the CT gantry and a pre procedural CT was performed re-demonstrating the known fluid collection within the left pleural space. The procedure was planned. A timeout was performed prior to the initiation of the procedure. The left posterolateral and inferior thorax was prepped and draped in the usual sterile fashion. The overlying soft tissues were anesthetized with 1% lidocaine  with epinephrine . Appropriate trajectory was planned with the use of a 22 gauge spinal needle. An 18 gauge trocar needle was advanced into the pleural space and a short Amplatz super stiff wire was coiled within the  collection. Appropriate positioning was confirmed with a limited CT scan. The tract was serially dilated allowing placement of a 14 French all-purpose drainage catheter. Appropriate positioning was confirmed with a limited postprocedural CT scan. The tube was connected to a pleurovac and sutured in place. A dressing was placed. The patient tolerated the procedure well without immediate post procedural complication. IMPRESSION: Successful CT guided placement of a 20 French all purpose drain catheter into the posterolateral and inferior left pleural space with aspiration of serous fluid. Ester Sides, MD [facility] Electronically Signed   By: Ester Sides M.D.   On: 06/07/2023 16:53     LOS: 6 days   Donalda Applebaum, MD  [facility]    To contact the attending provider between 7A-7P or the covering provider during after hours 7P-7A, please log into the web site [email or web address] and access using universal [place] password for that web site. If you do not have the password, please call the hospital operator.  06/09/2023, 9:42 AM

## 2023-06-09 NOTE — Progress Notes (Signed)
 [facility]  Date of Admission:  06/02/2023     Total days of antibiotics 8         ASSESSMENT:  Mr. Brent Bryant is Day 2 s/p left thoracostomy tube placement and continues to have drainage of about 200 cc over the past 24 hours in the setting of empyema. Pulmonary administering fibrinolytics. Blood cultures remain without growth. Discussed plan of care to continue with chest tube with management per IR/Pulmonogly along with continued dose of Unasyn . Anticipate change to oral Augmentin  with continued progression. Remaining medical and supportive care per Internal Medicine.   PLAN:  Continue current dose of Unasyn . Chest management per Pulmonary/IR.  Monitor blood cultures for bacteremia.  Remaining medical and supportive care per Internal Medicine.  Dr. Efrain is available over the weekend for any ID related questions.   Principal Problem:   CAP (community acquired pneumonia) Active Problems:   Polysubstance abuse (HCC)   Severe sepsis (HCC)   Empyema lung (HCC)   Protein-calorie malnutrition, severe   Empyema (HCC)   Pleural effusion    bisacodyl   10 mg Rectal Once   feeding supplement  237 mL Oral BID BM   guaiFENesin   600 mg Oral BID   lidocaine   2 patch Transdermal Q24H   multivitamin with minerals  1 tablet Oral Daily   polyethylene glycol  17 g Oral BID   senna-docusate  2 tablet Oral QHS   sodium chloride  flush  10 mL Intrapleural Q8H    SUBJECTIVE:  Afebrile overnight with no acute events. No concerns/complaints and feeling better.   Allergies  Allergen Reactions   Tylenol  [Acetaminophen ] Rash   Vicodin [Hydrocodone-Acetaminophen ] Rash     Review of Systems: Review of Systems  Constitutional:  Negative for chills, fever and weight loss.  Respiratory:  Negative for cough, shortness of breath and wheezing.   Cardiovascular:  Positive for chest pain (Chest wall pain). Negative for leg swelling.  Gastrointestinal:  Negative for  abdominal pain, constipation, diarrhea, nausea and vomiting.  Skin:  Negative for rash.      OBJECTIVE: Vitals:   06/09/23 0000 06/09/23 0823 06/09/23 0908 06/09/23 1241  BP: 102/60 110/71  (!) 95/58  Pulse: (!) 116     Resp: 16 (!) 26 14 (!) 24  Temp: 98.1 F (36.7 C) 97.6 F (36.4 C)  98 F (36.7 C)  TempSrc: Oral Oral  Oral  SpO2: 95% 91%  93%  Weight:      Height:       Body mass index is 20.08 kg/m.  Physical Exam Constitutional:      General: He is not in acute distress.    Appearance: He is well-developed.  Cardiovascular:     Rate and Rhythm: Normal rate and regular rhythm.     Heart sounds: Normal heart sounds.  Pulmonary:     Effort: Pulmonary effort is normal.     Breath sounds: Normal breath sounds.     Comments: Chest tube in place.  Skin:    General: Skin is warm and dry.  Neurological:     Mental Status: He is alert and oriented to person, place, and time.  Psychiatric:        Mood and Affect: Mood normal.     Lab Results Lab Results  Component Value Date   WBC 14.1 (H) 06/09/2023   HGB 9.7 (L) 06/09/2023   HCT 28.9 (L) 06/09/2023   MCV 91.7 06/09/2023   PLT 831 (H) 06/09/2023  Lab Results  Component Value Date   CREATININE 0.63 06/09/2023   BUN 5 (L) 06/09/2023   NA 129 (L) 06/09/2023   K 4.6 06/09/2023   CL 95 (L) 06/09/2023   CO2 26 06/09/2023    Lab Results  Component Value Date   ALT 11 06/07/2023   AST 18 06/07/2023   ALKPHOS 72 06/07/2023   BILITOT 0.3 06/07/2023     Microbiology: Recent Results (from the past 240 hours)  Resp panel by RT-PCR (RSV, Flu A&B, Covid) Anterior Nasal Swab     Status: None   Collection Time: 06/02/23  6:16 PM   Specimen: Anterior Nasal Swab  Result Value Ref Range Status   SARS Coronavirus 2 by RT PCR NEGATIVE NEGATIVE Final    Comment: (NOTE) SARS-CoV-2 target nucleic acids are NOT DETECTED.  The SARS-CoV-2 RNA is generally detectable in upper respiratory specimens during the acute  phase of infection. The lowest concentration of SARS-CoV-2 viral copies this assay can detect is 138 copies/mL. A negative result does not preclude SARS-Cov-2 infection and should not be used as the sole basis for treatment or other patient management decisions. A negative result may occur with  improper specimen collection/handling, submission of specimen other than nasopharyngeal swab, presence of viral mutation(s) within the areas targeted by this assay, and inadequate number of viral copies(<138 copies/mL). A negative result must be combined with clinical observations, patient history, and epidemiological information. The expected result is Negative.  Fact Sheet for Patients:  bloggercourse.com  Fact Sheet for Healthcare Providers:  seriousbroker.it  This test is no t yet approved or cleared by the [place]  FDA and  has been authorized for detection and/or diagnosis of SARS-CoV-2 by FDA under an Emergency Use Authorization (EUA). This EUA will remain  in effect (meaning this test can be used) for the duration of the COVID-19 declaration under Section 564(b)(1) of the Act, 21 U.S.C.section 360bbb-3(b)(1), unless the authorization is terminated  or revoked sooner.       Influenza A by PCR NEGATIVE NEGATIVE Final   Influenza B by PCR NEGATIVE NEGATIVE Final    Comment: (NOTE) The Xpert Xpress SARS-CoV-2/FLU/RSV plus assay is intended as an aid in the diagnosis of influenza from Nasopharyngeal swab specimens and should not be used as a sole basis for treatment. Nasal washings and aspirates are unacceptable for Xpert Xpress SARS-CoV-2/FLU/RSV testing.  Fact Sheet for Patients: bloggercourse.com  Fact Sheet for Healthcare Providers: seriousbroker.it  This test is not yet approved or cleared by the [place]  FDA and has been authorized for detection and/or diagnosis of  SARS-CoV-2 by FDA under an Emergency Use Authorization (EUA). This EUA will remain in effect (meaning this test can be used) for the duration of the COVID-19 declaration under Section 564(b)(1) of the Act, 21 U.S.C. section 360bbb-3(b)(1), unless the authorization is terminated or revoked.     Resp Syncytial Virus by PCR NEGATIVE NEGATIVE Final    Comment: (NOTE) Fact Sheet for Patients: bloggercourse.com  Fact Sheet for Healthcare Providers: seriousbroker.it  This test is not yet approved or cleared by the [place]  FDA and has been authorized for detection and/or diagnosis of SARS-CoV-2 by FDA under an Emergency Use Authorization (EUA). This EUA will remain in effect (meaning this test can be used) for the duration of the COVID-19 declaration under Section 564(b)(1) of the Act, 21 U.S.C. section 360bbb-3(b)(1), unless the authorization is terminated or revoked.  Performed at [facility], [address] [place], [place] [address]  Culture, blood (Routine X 2) w Reflex to ID Panel     Status: None   Collection Time: 06/03/23  1:30 AM   Specimen: BLOOD LEFT FOREARM  Result Value Ref Range Status   Specimen Description   Final    BLOOD LEFT FOREARM Performed at [facility], [address] [place], [place] [address]    Special Requests   Final    BOTTLES DRAWN AEROBIC AND ANAEROBIC Blood Culture results may not be optimal due to an inadequate volume of blood received in culture bottles Performed at [facility], [address] [place], [place] [address]    Culture   Final    NO GROWTH 5 DAYS Performed at [facility], [address], [place], [place] [address]    Report Status 06/08/2023 FINAL  Final  Culture, blood (Routine X 2) w Reflex to ID Panel     Status: None   Collection Time: 06/03/23  1:39 AM   Specimen: BLOOD LEFT HAND  Result Value Ref  Range Status   Specimen Description   Final    BLOOD LEFT HAND Performed at [facility], [address], [place], [place] [address]    Special Requests   Final    BOTTLES DRAWN AEROBIC AND ANAEROBIC Blood Culture adequate volume Performed at [facility], [address] [place], [place] [address]    Culture   Final    NO GROWTH 5 DAYS Performed at [facility], [address], [place], [place] [address]    Report Status 06/08/2023 FINAL  Final  Expectorated Sputum Assessment w Gram Stain, Rflx to Resp Cult     Status: None   Collection Time: 06/03/23  4:48 AM   Specimen: Sputum  Result Value Ref Range Status   Specimen Description   Final    SPUTUM Performed at [facility], [address] [place], [place] [address]    Special Requests   Final    Normal Performed at [facility], [address] [place], [place] [address]    Sputum evaluation   Final    THIS SPECIMEN IS ACCEPTABLE FOR SPUTUM CULTURE Performed at [facility], [address], [place], [place] [address]    Report Status 06/03/2023 FINAL  Final  Culture, Respiratory w Gram Stain     Status: None   Collection Time: 06/03/23  4:48 AM   Specimen: SPU  Result Value Ref Range Status   Specimen Description SPUTUM  Final   Special Requests Normal Reflexed from [id number]  Final   Gram Stain   Final    MODERATE WBC PRESENT,BOTH PMN AND MONONUCLEAR FEW GRAM POSITIVE COCCI IN PAIRS FEW GRAM NEGATIVE RODS RARE YEAST WITH PSEUDOHYPHAE    Culture   Final    RARE STREPTOCOCCUS GROUP F Beta hemolytic streptococci are predictably susceptible to penicillin and other beta lactams. Susceptibility testing not routinely performed. WITHIN NORMAL RESPIRATORY FLORA Performed at [facility], [address] [place], [place] [address]    Report Status 06/06/2023 FINAL  Final  Body fluid culture w Gram Stain     Status: None    Collection Time: 06/03/23  4:35 PM   Specimen: Pleural Fluid  Result Value Ref Range Status   Specimen Description FLUID PLEURAL  Final   Special Requests NONE  Final   Gram Stain   Final    FEW WBC PRESENT,BOTH PMN AND MONONUCLEAR NO ORGANISMS SEEN  Culture   Final    NO GROWTH 3 DAYS Performed at [facility], [address] [place], [place] [address]    Report Status 06/07/2023 FINAL  Final  Fungus Culture With Stain     Status: None (Preliminary result)   Collection Time: 06/03/23  4:35 PM   Specimen: Pleural, Left; Pleural Fluid  Result Value Ref Range Status   Fungus Stain Final report  Final    Comment: (NOTE) Performed At: [facility] [address] [place], [place] [id number] Jennette Shorter MD Ey:[id number]    Fungus (Mycology) Culture PENDING  Incomplete   Fungal Source FLUID  Final    Comment: PLEURAL Performed at [facility], [address] [place], [place] [address]   Fungus Culture Result     Status: None   Collection Time: 06/03/23  4:35 PM  Result Value Ref Range Status   Result 1 Comment  Final    Comment: (NOTE) KOH/Calcofluor preparation:  no fungus observed. Performed At: [facility] [address] [place], [place] [id number] Jennette Shorter MD Ey:[id number]   MRSA Next Gen by PCR, Nasal     Status: None   Collection Time: 06/04/23 12:20 AM   Specimen: Nasal Mucosa; Nasal Swab  Result Value Ref Range Status   MRSA by PCR Next Gen NOT DETECTED NOT DETECTED Final    Comment: (NOTE) The GeneXpert MRSA Assay (FDA approved for NASAL specimens only), is one component of a comprehensive MRSA colonization surveillance program. It is not intended to diagnose MRSA infection nor to guide or monitor treatment for MRSA infections. Test performance is not FDA approved in patients less than 60 years old. Performed at [facility], [address] [place], [place] [address]   Culture, blood (Routine X 2) w Reflex to ID  Panel     Status: None (Preliminary result)   Collection Time: 06/06/23  7:46 AM   Specimen: BLOOD RIGHT HAND  Result Value Ref Range Status   Specimen Description BLOOD RIGHT HAND  Final   Special Requests   Final    BOTTLES DRAWN AEROBIC AND ANAEROBIC Blood Culture results may not be optimal due to an inadequate volume of blood received in culture bottles   Culture   Final    NO GROWTH 3 DAYS Performed at [facility], [address], [place], [place] [address]    Report Status PENDING  Incomplete  Culture, blood (Routine X 2) w Reflex to ID Panel     Status: None (Preliminary result)   Collection Time: 06/06/23  7:46 AM   Specimen: BLOOD LEFT ARM  Result Value Ref Range Status   Specimen Description BLOOD LEFT ARM  Final   Special Requests   Final    BOTTLES DRAWN AEROBIC AND ANAEROBIC Blood Culture results may not be optimal due to an inadequate volume of blood received in culture bottles   Culture   Final    NO GROWTH 3 DAYS Performed at [facility], [address] [place], [place] [address]    Report Status PENDING  Incomplete    I have personally spent 26 minutes involved in face-to-face and non-face-to-face activities for this patient on the day of the visit. Professional time spent includes the following activities: Preparing to see the patient (review of tests), Obtaining and/or reviewing separately obtained history (admission/discharge record), Performing a medically appropriate examination and/or evaluation , Ordering medications/tests/procedures, referring and communicating with other health care professionals, Documenting clinical  information in the EMR, Independently interpreting results (not separately reported), Communicating results to the patient/family/caregiver, Counseling and educating the patient/family/caregiver and Care coordination (not separately reported).   Greg Raeshawn Vo, NP [facility]  06/09/2023  12:58 PM

## 2023-06-09 NOTE — Plan of Care (Signed)
 ?  Problem: Elimination: ?Goal: Will not experience complications related to urinary retention ?Outcome: Progressing ?  ?

## 2023-06-09 NOTE — Progress Notes (Signed)
 Occupational Therapy Treatment Patient Details Name: Brent Bryant MRN: [id number] DOB: [birth year] Today's Date: 06/09/2023   History of present illness Pt is a 33 y/o male presenting on 1/31 with 3 days of cough and chest pain. Work up revealed L pleural space and dense pneumonia. 2/1 s/p small bored chest tube.  Chest CT with large loculated left effusion.2/5 IR for guided chest tube placement. PMH includes: hepC, substance abuse.   OT comments  Pt. Seen for skilled OT treatment session. Pt. I with bed mobility in/out of bed.  LB dressing seated with MIN A. BP and HR monitored throughout see below for details. RN notified and aware.  Cont. With current acute OT POC.    BP and HR  Supine: 99/63-(75) Sit: 114/78-(89)  HR 122 with sitting, fluctuating to 116 at lowest but cont. At 120-122 Standing: 106/72-(82) HR 116-120s Back to bed R sidelying HR still 116-120s        If plan is discharge home, recommend the following:  A lot of help with bathing/dressing/bathroom;Assistance with cooking/housework   Equipment Recommendations       Recommendations for Other Services      Precautions / Restrictions Precautions Precautions: Fall Precaution Comments: L chest tube x2       Mobility Bed Mobility Overal bed mobility: Independent             General bed mobility comments: no physical assistance required, hob flat, no rails    Transfers Overall transfer level: Needs assistance Equipment used: None Transfers: Sit to/from Stand Sit to Stand: Supervision           General transfer comment: sit/stand x2, noted to have increased HR.  assisted back to bed vs. chair per pt. request     Balance                                           ADL either performed or assessed with clinical judgement   ADL Overall ADL's : Needs assistance/impaired                     Lower Body Dressing: Minimal assistance;Sitting/lateral leans Lower Body Dressing  Details (indicate cue type and reason): unable to fully don L sock secondary to c/o L flank pain while bending even with figure 4 method. no assistance for R sock               General ADL Comments: pts. BP more stable but noted increased HR even with sitting tasks so ambulation limited, pt. also requesting back to bed vs. 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  Also enjoys video games in his spare time    Pertinent Vitals/ Pain       Pain Assessment Pain Assessment: Faces Faces Pain Scale: Hurts a little bit Pain Location: L flank Pain Descriptors / Indicators: Discomfort, Grimacing Pain Intervention(s): Repositioned, Monitored during session, Limited activity within patient's tolerance  Home Living                                          Prior Functioning/Environment              Frequency  Min 1X/week        Progress Toward Goals  OT Goals(current goals can now be found in the care plan section)  Progress towards OT goals: Progressing toward goals     Plan      Co-evaluation                 AM-PAC OT 6 Clicks Daily Activity     Outcome Measure   Help from another person eating meals?: Total Help from another person taking care of personal grooming?: A Little Help from another person toileting, which includes using toliet, bedpan, or urinal?: A Little Help from another person bathing (including washing, rinsing, drying)?: A  Lot Help from another person to put on and taking off regular upper body clothing?: A Little Help from another person to put on and taking off regular lower body clothing?: A Lot 6 Click Score: 14    End of Session Equipment Utilized During Treatment: Oxygen  OT Visit Diagnosis: Other abnormalities of gait and mobility (R26.89);Pain Pain - Right/Left: Left   Activity Tolerance Patient tolerated treatment well   Patient Left in bed;with call bell/phone within reach;with bed alarm set   Nurse Communication Other (comment) (reported BPs to rn and incresed HR)        Time: 8668-8655 OT Time Calculation (min): 13 min  Charges: OT General Charges $OT Visit: 1 Visit OT Treatments $Self Care/Home Management : 8-22 mins  Randall, COTA/L Acute Rehabilitation [phone]   CHRISTELLA Nest Lorraine-COTA/L 06/09/2023, 2:27 PM

## 2023-06-09 NOTE — Progress Notes (Signed)
 NAME:  Brent Bryant, MRN:  [id number], DOB:  Jun 27, 1990, LOS: 6 ADMISSION DATE:  06/02/2023, CONSULTATION DATE:  06/03/23 REFERRING MD:  Raenelle, CHIEF COMPLAINT:  flank pain   History of Present Illness:  33 year old man with hx of HepC, substance abuse presenting with 3 days of cough and chest pain.   Cough with brown sputum.  Chest pain is pleuritic.  Workup has revealed complex L pleural space and dense pneumonia.  PCCM consulted for assistance with management.  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     Pertinent  Medical History  HepC  Significant Hospital Events: Including procedures, antibiotic start and stop dates in addition to other pertinent events   2/1 admit, small bored chest tube placed  2/2 First dose pleural lytics, out of chest tune since insertion 2/3 2nd dose of lytics. After intrapleural lytics patient had severe pain associated with tachycardia and tachypnea.  2/5 2nd CT placed posteriorly by IR 2/6 repeat dose of lytics in new chest tube  Interim History / Subjective:   Status post lytics and superior chest tube yesterday.  Patient complained of intense pain post procedure with tachycardia.  He does not want to get lytics again in the chest tube. 200 cc output after lytics and then 20 cc overnight from chest tube  Objective   Blood pressure 110/71, pulse (!) 116, temperature 97.6 F (36.4 C), temperature source Oral, resp. rate (!) 26, height 5' 8 (1.727 m), weight 59.9 kg, SpO2 91%.        Intake/Output Summary (Last 24 hours) at 06/09/2023 1008 Last data filed at 06/09/2023 0643 Gross per 24 hour  Intake --  Output 1680 ml  Net -1680 ml   Filed Weights   06/03/23 2243 06/06/23 1400  Weight: 59 kg 59.9 kg   Physical Exam: Blood pressure (!) 95/58, pulse (!) 116, temperature 98 F (36.7 C), temperature source Oral, resp. rate 20, height 5' 8 (1.727 m), weight 59.9 kg, SpO2 93%. Gen:      No acute distress frail, cachectic HEENT:  EOMI,  sclera anicteric.  Poor dentition Neck:     No masses; no thyromegaly Lungs:    Clear to auscultation bilaterally; normal respiratory effort CV:         Regular rate and rhythm; no murmurs Abd:      + bowel sounds; soft, non-tender; no palpable masses, no distension Ext:    No edema; adequate peripheral perfusion Skin:      Warm and dry; no rash Neuro: alert and oriented x 3 Psych: normal mood and affect   Resolved Hospital Problem list   N/A  Assessment & Plan:  Empyema Sepsis Hx polysubstance abuse -S/p lytics 2/2 and 2/3.  -another CT tube posterior placed on 2/5 and lytics 2/6 Plan:  Patient refusing to get another dose of lytics due to pain from procedure Chest x-ray reviewed with almost complete resolution of left effusion -cont CT to -20 cmH2O  -monitor CT outpt -CXR in am.  Consider follow-up CT tomorrow -unasyn  -follow cultures -pain management per primary  Best Practice (right click and Reselect all SmartList Selections daily)  Per primary  Critical care time: N/A   Lonna Coder MD [facility] See Amion for pager  If no response to pager , please call [phone] until 7pm After 7:00 pm call Elink  [phone] 06/09/2023, 3:57 PM  After hours, please call ELink [phone].

## 2023-06-09 NOTE — Progress Notes (Signed)
 Nutrition Follow-up  DOCUMENTATION CODES:   Severe malnutrition in context of social or environmental circumstances  INTERVENTION:  Continue to encourage oral intake Ensure Plus High Protein po BID, each supplement provides 350 kcal and 20 grams of protein. Mighty Shake TID with meals, each supplement provides 330 kcals and 9 grams of protein Multivitamin with minerals . NUTRITION DIAGNOSIS:   Severe Malnutrition related to social / environmental circumstances as evidenced by severe muscle depletion, severe fat depletion.  Ongoing with interventions in place  GOAL:   Patient will meet greater than or equal to 90% of their needs    MONITOR:   PO intake  REASON FOR ASSESSMENT:   Consult Assessment of nutrition requirement/status  ASSESSMENT:   33 y.o. M, presented to [facility] with complaints of left sided chest pain/flank pain x 4 days, intermittent diarrhea 7-10 days. Transferred to [facility] services at [facility] for further evaluation. Found to have sepsis 2/2 left lobar pneumonia with probable empyema. PMH: polysubstance drug use (claims in remission x 6 years-however UDS positive for cocaine), HCV.  No BM since admission, bowel regimen in place. Appetite seems to be improving.  Ensures implemented and appears as though he is accepting them. Patient stated no concerns for RD on this day.   Significant events: 2/01>> admit to [facility] consult-chest tube placed (pleural fluid-LDH 1174, WBC 7700) 2/2>> intrapleural lytics 2/3>> intrapleural lytics 2/4>> CT chest: Worsening left pleural effusion 2/5>> second chest tube placed by IR 2/6>> intrapleural lytics   Significant studies: 1/31>> CT abdomen/pelvis: Loculated pleural effusion-hide left lower PNA. 2/02>> TTE: EF 65-70% 2/04>> CT chest: Worsening loculated left pleural effusion despite placement of pigtail 2/04>> CT maxillofacial: Advanced dental/periodontal disease.   Significant microbiology data: 1/31>>  COVID/influenza/RSV PCR: Negative 2/01>> pleural fluid culture: No growth 2/01>> sputum culture: rare strep group F 2/01>> blood culture: No growth 2/04>> blood culture: No growth   NUTRITION - FOCUSED PHYSICAL EXAM:  Flowsheet Row Most Recent Value  Orbital Region Severe depletion  Upper Arm Region Severe depletion  Thoracic and Lumbar Region Severe depletion  Buccal Region Severe depletion  Temple Region Severe depletion  Clavicle Bone Region Severe depletion  Clavicle and Acromion Bone Region Severe depletion  Scapular Bone Region Severe depletion  Dorsal Hand Severe depletion  Patellar Region Severe depletion  Anterior Thigh Region Severe depletion  Posterior Calf Region Severe depletion  Edema (RD Assessment) None  Hair Reviewed  Eyes Reviewed  Mouth Reviewed  [Teeth in poor shape.]  Skin Reviewed  Nails Reviewed       Diet Order:   Diet Order             Diet regular Fluid consistency: Thin  Diet effective now                   EDUCATION NEEDS:   Education needs have been addressed  Skin:  Skin Assessment: Reviewed RN Assessment  Last BM:  PTA  Height:   Ht Readings from Last 1 Encounters:  06/03/23 5' 8 (1.727 m)    Weight:   Wt Readings from Last 1 Encounters:  06/06/23 59.9 kg    Ideal Body Weight:     BMI:  Body mass index is 20.08 kg/m.  Estimated Nutritional Needs:   Kcal:  2100-2450 kcal  Protein:  95-115 g  Fluid:  58ml/kcal    Jenna Pew RDN, LDN Clinical Dietitian   If unable to reach, please contact RD Inpatient secure chat group between 8 am-4 pm daily

## 2023-06-10 ENCOUNTER — Inpatient Hospital Stay (HOSPITAL_COMMUNITY): Payer: MEDICAID

## 2023-06-10 LAB — CBC
HCT: 30 % — ABNORMAL LOW (ref 39.0–52.0)
Hemoglobin: 9.9 g/dL — ABNORMAL LOW (ref 13.0–17.0)
MCH: 30.7 pg (ref 26.0–34.0)
MCHC: 33 g/dL (ref 30.0–36.0)
MCV: 92.9 fL (ref 80.0–100.0)
Platelets: 899 10*3/uL — ABNORMAL HIGH (ref 150–400)
RBC: 3.23 MIL/uL — ABNORMAL LOW (ref 4.22–5.81)
RDW: 13.9 % (ref 11.5–15.5)
WBC: 11.4 10*3/uL — ABNORMAL HIGH (ref 4.0–10.5)
nRBC: 0 % (ref 0.0–0.2)

## 2023-06-10 LAB — BASIC METABOLIC PANEL
Anion gap: 17 — ABNORMAL HIGH (ref 5–15)
BUN: 6 mg/dL (ref 6–20)
CO2: 24 mmol/L (ref 22–32)
Calcium: 9 mg/dL (ref 8.9–10.3)
Chloride: 93 mmol/L — ABNORMAL LOW (ref 98–111)
Creatinine, Ser: 0.58 mg/dL — ABNORMAL LOW (ref 0.61–1.24)
GFR, Estimated: >60 mL/min (ref >60)
Glucose, Bld: 102 mg/dL — ABNORMAL HIGH (ref 70–99)
Potassium: 4.5 mmol/L (ref 3.5–5.1)
Sodium: 134 mmol/L — ABNORMAL LOW (ref 135–145)

## 2023-06-10 NOTE — Progress Notes (Signed)
 Physical Therapy Treatment Patient Details Name: Brent Bryant MRN: [id number] DOB: Feb 16, 1991 Today's Date: 06/10/2023   History of Present Illness Pt is a 33 y/o male presenting on 1/31 with 3 days of cough and chest pain. Work up revealed L pleural space and dense pneumonia. 2/1 s/p small bored chest tube.  Chest CT with large loculated left effusion.2/5 IR for guided chest tube placement. PMH includes: hepC, substance abuse.    PT Comments  Pt tolerated treatment well today. Pt with similar presentation to previous session. No change in DC/DME recs at this time. PT will continue to follow.     If plan is discharge home, recommend the following: Assist for transportation   Can travel by private vehicle        Equipment Recommendations  None recommended by PT    Recommendations for Other Services       Precautions / Restrictions Precautions Precautions: Fall Precaution Comments: L chest tube x2 Restrictions Weight Bearing Restrictions Per Provider Order: No     Mobility  Bed Mobility Overal bed mobility: Independent             General bed mobility comments: no physical assistance required, hob flat, no rails    Transfers Overall transfer level: Independent Equipment used: None Transfers: Sit to/from Stand Sit to Stand: Independent           General transfer comment: no physical assist. Able to help with line management.    Ambulation/Gait Ambulation/Gait assistance: Supervision, +2 safety/equipment Gait Distance (Feet): 450 Feet Assistive device: None Gait Pattern/deviations: Decreased stride length, Step-through pattern   Gait velocity interpretation: >2.62 ft/sec, indicative of community ambulatory   General Gait Details: Pt overall steady. no LOB noted. +2 for management of chest tubes and lines and leads.   Stairs             Wheelchair Mobility     Tilt Bed    Modified Rankin (Stroke Patients Only)       Balance Overall balance  assessment: No apparent balance deficits (not formally assessed)                                          Cognition Arousal: Alert Behavior During Therapy: WFL for tasks assessed/performed Overall Cognitive Status: Within Functional Limits for tasks assessed                                 General Comments: appears [facility]        Exercises      General Comments General comments (skin integrity, edema, etc.): VSS. HR in the 100s      Pertinent Vitals/Pain Pain Assessment Pain Assessment: Faces Faces Pain Scale: Hurts a little bit Pain Location: L flank Pain Descriptors / Indicators: Discomfort, Grimacing Pain Intervention(s): Premedicated before session, Monitored during session    Home Living                          Prior Function            PT Goals (current goals can now be found in the care plan section) Acute Rehab PT Goals Patient Stated Goal: to go home Progress towards PT goals: Progressing toward goals    Frequency    Min 1X/week  PT Plan      Co-evaluation              AM-PAC PT 6 Clicks Mobility   Outcome Measure  Help needed turning from your back to your side while in a flat bed without using bedrails?: None Help needed moving from lying on your back to sitting on the side of a flat bed without using bedrails?: None Help needed moving to and from a bed to a chair (including a wheelchair)?: None Help needed standing up from a chair using your arms (e.g., wheelchair or bedside chair)?: A Little Help needed to walk in hospital room?: A Little Help needed climbing 3-5 steps with a railing? : A Little 6 Click Score: 21    End of Session Equipment Utilized During Treatment:  (Deferred gait belt due to chest tubes) Activity Tolerance: Patient tolerated treatment well Patient left: in bed;with call bell/phone within reach Nurse Communication: Mobility status PT Visit Diagnosis: Other  abnormalities of gait and mobility (R26.89)     Time: 8890-8880 PT Time Calculation (min) (ACUTE ONLY): 10 min  Charges:    $Gait Training: 8-22 mins PT General Charges $$ ACUTE PT VISIT: 1 Visit                     Brent Bryant, PT, DPT Acute Rehab Services [phone]    Brent Bryant 06/10/2023, 1:20 PM

## 2023-06-10 NOTE — Plan of Care (Signed)
  Problem: Education: Goal: Knowledge of General Education information will improve Description: Including pain rating scale, medication(s)/side effects and non-pharmacologic comfort measures Outcome: Progressing   Problem: Health Behavior/Discharge Planning: Goal: Ability to manage health-related needs will improve Outcome: Progressing   Problem: Clinical Measurements: Goal: Will remain free from infection Outcome: Progressing Goal: Diagnostic test results will improve Outcome: Progressing Goal: Respiratory complications will improve Outcome: Progressing   Problem: Activity: Goal: Risk for activity intolerance will decrease Outcome: Progressing   Problem: Coping: Goal: Level of anxiety will decrease Outcome: Progressing   Problem: Pain Managment: Goal: General experience of comfort will improve and/or be controlled Outcome: Progressing

## 2023-06-10 NOTE — Progress Notes (Signed)
 Still awaiting ordered CT chest Plan Once completed we can evaluate and decide on CT removal

## 2023-06-10 NOTE — Plan of Care (Signed)
  Problem: Health Behavior/Discharge Planning: Goal: Ability to manage health-related needs will improve Outcome: Progressing   Problem: Clinical Measurements: Goal: Diagnostic test results will improve Outcome: Progressing   Problem: Activity: Goal: Risk for activity intolerance will decrease Outcome: Progressing   Problem: Nutrition: Goal: Adequate nutrition will be maintained Outcome: Progressing   Problem: Coping: Goal: Level of anxiety will decrease Outcome: Progressing   Problem: Pain Managment: Goal: General experience of comfort will improve and/or be controlled Outcome: Progressing   Problem: Safety: Goal: Ability to remain free from injury will improve Outcome: Progressing

## 2023-06-10 NOTE — Progress Notes (Signed)
   NAME:  Brent Bryant, MRN:  [id number], DOB:  [birth year], LOS: 7 ADMISSION DATE:  06/02/2023, CONSULTATION DATE:  06/03/23 REFERRING MD:  Raenelle, CHIEF COMPLAINT:  flank pain   History of Present Illness:  33 year old man with hx of HepC, substance abuse presenting with 3 days of cough and chest pain.   Cough with brown sputum.  Chest pain is pleuritic.  Workup has revealed complex L pleural space and dense pneumonia.  PCCM consulted for assistance with management.  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     Pertinent  Medical History  HepC  Significant Hospital Events: Including procedures, antibiotic start and stop dates in addition to other pertinent events   2/1 admit, small bored chest tube placed  2/2 First dose pleural lytics, out of chest tune since insertion 2/3 2nd dose of lytics. After intrapleural lytics patient had severe pain associated with tachycardia and tachypnea.  2/5 2nd CT placed posteriorly by IR 2/6 repeat dose of lytics in new chest tube  Interim History / Subjective:   Status post lytics and superior chest tube yesterday.  Patient complained of intense pain post procedure with tachycardia.  He does not want to get lytics again in the chest tube. 200 cc output after lytics and then 20 cc overnight from chest tube  Objective   Blood pressure 113/83, pulse 90, temperature 98.2 F (36.8 C), temperature source Oral, resp. rate 20, height 5' 8 (1.727 m), weight 59.9 kg, SpO2 96%.        Intake/Output Summary (Last 24 hours) at 06/10/2023 1007 Last data filed at 06/10/2023 0452 Gross per 24 hour  Intake 30 ml  Output 1270 ml  Net -1240 ml   Filed Weights   06/03/23 2243 06/06/23 1400  Weight: 59 kg 59.9 kg   Physical Exam: General chronically ill appearing 33 year old male no distress HENT NCAT does have temporal wasting Lung crackles left base room air no accessory use, No output from anterior left CT over last 24 hrs and only 30 from  posterior lateral tube, Pcxr left sided air space disease improving. Effusions really  Card rrr Abd soft  Ext warm  Neuro intact    Resolved Hospital Problem list   N/A  Assessment & Plan:  Empyema Sepsis Hx polysubstance abuse -S/p lytics 2/2 and 2/3.  -another CT tube posterior placed on 2/5 and lytics 2/6.  -refused repeat dose on 2/7. Overall CXR improving as of 2/8 Pleural output minimal in both tubes Plan: Repeat CT chest today Looks like output from th more anterior tube has had minimal output and no output over last 48hrs, the more lateral tube put out 30ml last 24hrs Will see what CT chest looks like but think we can dc the more anterior tube today and then if the lateral tube output remains <50cc over next 24 hrs can prob dc that tube as well Cont unasyn    Best Practice (right click and Reselect all SmartList Selections daily)  Per primary  Critical care time: N/A

## 2023-06-10 NOTE — Progress Notes (Signed)
 PT Cancellation Note  Patient Details Name: Brent Bryant MRN: [id number] DOB: [birth year]   Cancelled Treatment:    Reason Eval/Treat Not Completed: Patient declined, no reason specified (Pt refused stating that he only got 3 hours of sleep last night. Will try again later if time allows.)   Yanel Dombrosky 06/10/2023, 9:55 AM

## 2023-06-10 NOTE — Progress Notes (Signed)
 Pharmacy Antibiotic Note  Brent Bryant is a 33 y.o. male admitted on 06/02/2023 with pneumonia/empyema.  Pharmacy has been consulted for Unasyn  dosing.   Patient's chest tube remain sin place with plans for a CT today to evaluate fluid collection. Output has been minimal over the last 48 hours. WBC trending down at 11.4k today, patient afebrile. Patient is on D8 unasyn  for PNA/empyema. Scr low and stable around 0.6.   Plan: Continue Unasyn  3g IV q6h Monitor signs and symptoms of infection, clinical progress, and renal function   Height: 5' 8 (172.7 cm) Weight: 59.9 kg (132 lb 0.9 oz) IBW/kg (Calculated) : 68.4  Temp (24hrs), Avg:98.2 F (36.8 C), Min:97.8 F (36.6 C), Max:98.6 F (37 C)  Recent Labs  Lab 06/06/23 0435 06/07/23 0445 06/08/23 0437 06/09/23 0546 06/10/23 0524  WBC 29.2* 17.6* 16.0* 14.1* 11.4*  CREATININE 0.70 0.47* 0.51* 0.63 0.58*    Estimated Creatinine Clearance: 110.2 mL/min (A) (by C-G formula based on SCr of 0.58 mg/dL (L)).    Allergies  Allergen Reactions   Tylenol  [Acetaminophen ] Rash   Vicodin [Hydrocodone-Acetaminophen ] Rash    Antimicrobials this admission: Vanc 2/1 >> 2/3  Azithro IV 2/1 >> 2/3 Unasyn  2/1 >>  Dose adjustments this admission:  Microbiology results: 2/1 ARk:whui 2/1 Sputum: rare Strep Group F, sens beta-lactams 2/1 Pleural: ngF 2/2 MRSA PCR: neg 2/4 blood>>ngtd  Chiquita LOIS Morin, PharmD, BCPS PGY2 Pharmacy Resident

## 2023-06-10 NOTE — Progress Notes (Signed)
 PROGRESS NOTE        PATIENT DETAILS Name: Brent Bryant Age: 33 y.o. Sex: male Date of Birth: January 21, 1991 Admit Date: 06/02/2023 Admitting Physician Brent Speaker, MD ERE:Bryant, [name], [name]  Brief Summary: Patient is a 33 y.o.  male with history polysubstance abuse, chronic HCV-who presented with severe left-sided chest pain/flank pain-found to have sepsis secondary to left lower lobe pneumonia with empyema  Significant events: 2/01>> admit to [facility] consult-chest tube placed (pleural fluid-LDH 1174, WBC 7700) 2/2>> intrapleural lytics 2/3>> intrapleural lytics 2/4>> CT chest: Worsening left pleural effusion 2/5>> second chest tube placed by IR 2/6>> intrapleural lytics 2/7>> refused intrapleural lytics due to worsening pain.  Significant studies: 1/31>> CT abdomen/pelvis: Loculated pleural effusion-hide left lower PNA. 2/02>> TTE: EF 65-70% 2/04>> CT chest: Worsening loculated left pleural effusion despite placement of pigtail 2/04>> CT maxillofacial: Advanced dental/periodontal disease.  Significant microbiology data: 1/31>> COVID/influenza/RSV PCR: Negative 2/01>> pleural fluid culture: No growth 2/01>> sputum culture: rare strep group F 2/01>> blood culture: No growth 2/04>> blood culture: No growth  Procedures: 2/01>> chest tube placement by PCCM 2/05>> second chest tube placement by IR  Consults: PCCM ID  Subjective: Finally had a BM yesterday-complains of pain in the left side of his chest-requesting if one of the 2 tubes could be removed.  Objective: Vitals: Blood pressure 113/83, pulse 90, temperature 98.2 F (36.8 C), temperature source Oral, resp. rate 20, height 5' 8 (1.727 m), weight 59.9 kg, SpO2 96%.   Exam: Gen Exam:Alert awake-not in any distress HEENT:atraumatic, normocephalic Chest: B/L clear to auscultation anteriorly CVS:S1S2 regular Abdomen:soft non tender, non distended Extremities:no edema Neurology: Non  focal Skin: no rash  Pertinent Labs/Radiology:    Latest Ref Rng & Units 06/10/2023    5:24 AM 06/09/2023    5:46 AM 06/08/2023    4:37 AM  CBC  WBC 4.0 - 10.5 K/uL 11.4  14.1  16.0   Hemoglobin 13.0 - 17.0 g/dL 9.9  9.7  89.5   Hematocrit 39.0 - 52.0 % 30.0  28.9  31.1   Platelets 150 - 400 K/uL 899  831  734     Lab Results  Component Value Date   NA 134 (L) 06/10/2023   K 4.5 06/10/2023   CL 93 (L) 06/10/2023   CO2 24 06/10/2023    Assessment/Plan: Sepsis secondary to left lower lobe PNA with empyema-s/p chest tube placement by PCCM on 2/1 Sepsis physiology continues to improve First chest tube placed by PCCM on 2/1-received lytics on 2/2 and 2/3.  Unfortunately pleural effusion continued to enlarge-requiring second chest tube placement on 2/5.  Received lytics  again on 2/6.  Refused lytics on 2/7. Remains on Unasyn  PCCM has ordered another CT chest to reassess-whether chest tube can be removed. ID/PCCM following  Dental caries/poor oral hygiene Outpatient dentistry follow-up-no inpatient dentist coverage at [facility].  Chronic HCV Outpatient treatment if patient follows up in the ID clinic.  Normocytic anemia Secondary to acute illness Follow CBC  Hyponatremia Mild Asymptomatic Repeat electrolytes tomorrow  History of polysubstance abuse-UDS positive for opiates/cocaine/THC Per patient-he has been clean for more than 6 months--suspects that his UDS is positive as he was exposed to cocaine that his brother was using in his apartment.  Underweight: Cachectic/emaciated-will get nutrition evaluation HIV negative TSH stable  Constipation Finally had a BM on 2/7 Continue MiraLAX /senna.  Nutrition Status: Nutrition Problem: Severe Malnutrition Etiology: social / environmental circumstances Signs/Symptoms: severe muscle depletion, severe fat depletion Interventions: MVI, Hormel Shake    Estimated body mass index is 20.08 kg/m as calculated from the following:    Height as of this encounter: 5' 8 (1.727 m).   Weight as of this encounter: 59.9 kg.   Code status:   Code Status: Full Code   DVT Prophylaxis: Place and maintain sequential compression device Start: 06/05/23 2016   Family Communication: None at bedside   Disposition Plan: Status is: Inpatient Remains inpatient appropriate because:    Planned Discharge Destination:Home   Diet: Diet Order             Diet regular Fluid consistency: Thin  Diet effective now                     Antimicrobial agents: Anti-infectives (From admission, onward)    Start     Dose/Rate Route Frequency Ordered Stop   06/03/23 2200  azithromycin  (ZITHROMAX ) 500 mg in sodium chloride  0.9 % 250 mL IVPB        500 mg 250 mL/hr over 60 Minutes Intravenous Every 24 hours 06/03/23 1529 06/05/23 1515   06/03/23 1730  vancomycin  (VANCOCIN ) IVPB 1000 mg/200 mL premix  Status:  Discontinued        1,000 mg 200 mL/hr over 60 Minutes Intravenous Every 12 hours 06/03/23 1541 06/05/23 1407   06/03/23 1630  Ampicillin -Sulbactam (UNASYN ) 3 g in sodium chloride  0.9 % 100 mL IVPB        3 g 200 mL/hr over 30 Minutes Intravenous Every 6 hours 06/03/23 1541     06/03/23 0000  azithromycin  (ZITHROMAX ) 500 mg in sodium chloride  0.9 % 250 mL IVPB        500 mg 250 mL/hr over 60 Minutes Intravenous  Once 06/02/23 2351 06/03/23 0143   06/02/23 2345  cefTRIAXone  (ROCEPHIN ) 1 g in sodium chloride  0.9 % 100 mL IVPB        1 g 200 mL/hr over 30 Minutes Intravenous  Once 06/02/23 2342 06/03/23 0024   06/02/23 2345  metroNIDAZOLE  (FLAGYL ) IVPB 500 mg  Status:  Discontinued        500 mg 100 mL/hr over 60 Minutes Intravenous  Once 06/02/23 2342 06/02/23 2350        MEDICATIONS: Scheduled Meds:  bisacodyl   10 mg Rectal Once   feeding supplement  237 mL Oral BID BM   guaiFENesin   600 mg Oral BID   lidocaine   2 patch Transdermal Q24H   multivitamin with minerals  1 tablet Oral Daily   polyethylene glycol  17 g  Oral BID   senna-docusate  2 tablet Oral QHS   sodium chloride  flush  10 mL Intrapleural Q8H   Continuous Infusions:  ampicillin -sulbactam (UNASYN ) IV 3 g (06/10/23 1053)   PRN Meds:.acetaminophen  **OR** acetaminophen , bisacodyl , levalbuterol , LORazepam , metoprolol  tartrate, morphine  injection, ondansetron  **OR** ondansetron  (ZOFRAN ) IV, oxyCODONE , sodium phosphate   I have personally reviewed following labs and imaging studies  LABORATORY DATA: CBC: Recent Labs  Lab 06/03/23 1603 06/04/23 0803 06/05/23 0211 06/05/23 2005 06/06/23 0435 06/07/23 0445 06/08/23 0437 06/09/23 0546 06/10/23 0524  WBC 31.2*   < > 32.5*  --  29.2* 17.6* 16.0* 14.1* 11.4*  NEUTROABS 26.5*  --  25.0*  --   --  13.1*  --   --   --   HGB 11.3*   < > 10.9*   < > 10.6*  9.6* 10.4* 9.7* 9.9*  HCT 32.8*   < > 31.5*   < > 31.2* 28.2* 31.1* 28.9* 30.0*  MCV 89.4   < > 88.7  --  89.7 91.0 91.5 91.7 92.9  PLT 359   < > 411*  --  514* 513* 734* 831* 899*   < > = values in this interval not displayed.    Basic Metabolic Panel: Recent Labs  Lab 06/05/23 0211 06/06/23 0435 06/07/23 0445 06/08/23 0437 06/09/23 0546 06/10/23 0524  NA 130* 131* 128* 130* 129* 134*  K 4.3 4.4 4.1 4.3 4.6 4.5  CL 102 102 97* 97* 95* 93*  CO2 21* 22 24 26 26 24   GLUCOSE 97 97 111* 90 94 102*  BUN 15 12 6  <5* 5* 6  CREATININE 0.58* 0.70 0.47* 0.51* 0.63 0.58*  CALCIUM 7.3* 7.1* 7.4* 7.5* 8.0* 9.0  MG 1.8  --  1.6* 1.9  --   --   PHOS 5.5*  --  3.8 4.3  --   --     GFR: Estimated Creatinine Clearance: 110.2 mL/min (A) (by C-G formula based on SCr of 0.58 mg/dL (L)).  Liver Function Tests: Recent Labs  Lab 06/03/23 1603 06/04/23 0803 06/05/23 0211 06/06/23 0435 06/07/23 0445  AST 16 15  --  17 18  ALT 12 10  --  11 11  ALKPHOS 163* 128*  --  74 72  BILITOT 1.1 1.1  --  0.5 0.3  PROT 5.5* 5.1*  --  4.4* 4.7*  ALBUMIN <1.5* <1.5* <1.5* <1.5* <1.5*   No results for input(s): LIPASE, AMYLASE in the last 168  hours. No results for input(s): AMMONIA in the last 168 hours.  Coagulation Profile: Recent Labs  Lab 06/03/23 1603  INR 1.2    Cardiac Enzymes: No results for input(s): CKTOTAL, CKMB, CKMBINDEX, TROPONINI in the last 168 hours.  BNP (last 3 results) No results for input(s): PROBNP in the last 8760 hours.  Lipid Profile: No results for input(s): CHOL, HDL, LDLCALC, TRIG, CHOLHDL, LDLDIRECT in the last 72 hours.  Thyroid Function Tests: No results for input(s): TSH, T4TOTAL, FREET4, T3FREE, THYROIDAB in the last 72 hours.   Anemia Panel: No results for input(s): VITAMINB12, FOLATE, FERRITIN, TIBC, IRON, RETICCTPCT in the last 72 hours.  Urine analysis:    Component Value Date/Time   COLORURINE YELLOW 06/02/2023 2216   APPEARANCEUR CLEAR 06/02/2023 2216   LABSPEC 1.021 06/02/2023 2216   PHURINE 6.0 06/02/2023 2216   GLUCOSEU NEGATIVE 06/02/2023 2216   HGBUR NEGATIVE 06/02/2023 2216   BILIRUBINUR NEGATIVE 06/02/2023 2216   KETONESUR NEGATIVE 06/02/2023 2216   PROTEINUR TRACE (A) 06/02/2023 2216   NITRITE NEGATIVE 06/02/2023 2216   LEUKOCYTESUR NEGATIVE 06/02/2023 2216    Sepsis Labs: Lactic Acid, Venous No results found for: LATICACIDVEN  MICROBIOLOGY: Recent Results (from the past 240 hours)  Resp panel by RT-PCR (RSV, Flu A&B, Covid) Anterior Nasal Swab     Status: None   Collection Time: 06/02/23  6:16 PM   Specimen: Anterior Nasal Swab  Result Value Ref Range Status   SARS Coronavirus 2 by RT PCR NEGATIVE NEGATIVE Final    Comment: (NOTE) SARS-CoV-2 target nucleic acids are NOT DETECTED.  The SARS-CoV-2 RNA is generally detectable in upper respiratory specimens during the acute phase of infection. The lowest concentration of SARS-CoV-2 viral copies this assay can detect is 138 copies/mL. A negative result does not preclude SARS-Cov-2 infection and should not be used as the sole basis for  treatment or other  patient management decisions. A negative result may occur with  improper specimen collection/handling, submission of specimen other than nasopharyngeal swab, presence of viral mutation(s) within the areas targeted by this assay, and inadequate number of viral copies(<138 copies/mL). A negative result must be combined with clinical observations, patient history, and epidemiological information. The expected result is Negative.  Fact Sheet for Patients:  bloggercourse.com  Fact Sheet for Healthcare Providers:  seriousbroker.it  This test is no t yet approved or cleared by the [place]  FDA and  has been authorized for detection and/or diagnosis of SARS-CoV-2 by FDA under an Emergency Use Authorization (EUA). This EUA will remain  in effect (meaning this test can be used) for the duration of the COVID-19 declaration under Section 564(b)(1) of the Act, 21 U.S.C.section 360bbb-3(b)(1), unless the authorization is terminated  or revoked sooner.       Influenza A by PCR NEGATIVE NEGATIVE Final   Influenza B by PCR NEGATIVE NEGATIVE Final    Comment: (NOTE) The Xpert Xpress SARS-CoV-2/FLU/RSV plus assay is intended as an aid in the diagnosis of influenza from Nasopharyngeal swab specimens and should not be used as a sole basis for treatment. Nasal washings and aspirates are unacceptable for Xpert Xpress SARS-CoV-2/FLU/RSV testing.  Fact Sheet for Patients: bloggercourse.com  Fact Sheet for Healthcare Providers: seriousbroker.it  This test is not yet approved or cleared by the [place]  FDA and has been authorized for detection and/or diagnosis of SARS-CoV-2 by FDA under an Emergency Use Authorization (EUA). This EUA will remain in effect (meaning this test can be used) for the duration of the COVID-19 declaration under Section 564(b)(1) of the Act, 21 U.S.C. section  360bbb-3(b)(1), unless the authorization is terminated or revoked.     Resp Syncytial Virus by PCR NEGATIVE NEGATIVE Final    Comment: (NOTE) Fact Sheet for Patients: bloggercourse.com  Fact Sheet for Healthcare Providers: seriousbroker.it  This test is not yet approved or cleared by the [place]  FDA and has been authorized for detection and/or diagnosis of SARS-CoV-2 by FDA under an Emergency Use Authorization (EUA). This EUA will remain in effect (meaning this test can be used) for the duration of the COVID-19 declaration under Section 564(b)(1) of the Act, 21 U.S.C. section 360bbb-3(b)(1), unless the authorization is terminated or revoked.  Performed at [facility], [address], [place], [place] [address]   Culture, blood (Routine X 2) w Reflex to ID Panel     Status: None   Collection Time: 06/03/23  1:30 AM   Specimen: BLOOD LEFT FOREARM  Result Value Ref Range Status   Specimen Description   Final    BLOOD LEFT FOREARM Performed at [facility], [address], [place], [place] [address]    Special Requests   Final    BOTTLES DRAWN AEROBIC AND ANAEROBIC Blood Culture results may not be optimal due to an inadequate volume of blood received in culture bottles Performed at [facility], [address] [place], [place] [address]    Culture   Final    NO GROWTH 5 DAYS Performed at [facility], [address] [place], [place] [address]    Report Status 06/08/2023 FINAL  Final  Culture, blood (Routine X 2) w Reflex to ID Panel     Status: None   Collection Time: 06/03/23  1:39 AM   Specimen: BLOOD LEFT HAND  Result Value Ref Range Status   Specimen Description   Final  BLOOD LEFT HAND Performed at [facility], [address], [place], [place] [address]    Special Requests   Final    BOTTLES DRAWN AEROBIC AND ANAEROBIC  Blood Culture adequate volume Performed at [facility], [address], [place], [place] [address]    Culture   Final    NO GROWTH 5 DAYS Performed at [facility], [address] [place], [place] [address]    Report Status 06/08/2023 FINAL  Final  Expectorated Sputum Assessment w Gram Stain, Rflx to Resp Cult     Status: None   Collection Time: 06/03/23  4:48 AM   Specimen: Sputum  Result Value Ref Range Status   Specimen Description   Final    SPUTUM Performed at [facility], [address] [place], [place] [address]    Special Requests   Final    Normal Performed at [facility], [address], [place], [place] [address]    Sputum evaluation   Final    THIS SPECIMEN IS ACCEPTABLE FOR SPUTUM CULTURE Performed at [facility], [address] [place], [place] [address]    Report Status 06/03/2023 FINAL  Final  Culture, Respiratory w Gram Stain     Status: None   Collection Time: 06/03/23  4:48 AM   Specimen: SPU  Result Value Ref Range Status   Specimen Description SPUTUM  Final   Special Requests Normal Reflexed from [id number]  Final   Gram Stain   Final    MODERATE WBC PRESENT,BOTH PMN AND MONONUCLEAR FEW GRAM POSITIVE COCCI IN PAIRS FEW GRAM NEGATIVE RODS RARE YEAST WITH PSEUDOHYPHAE    Culture   Final    RARE STREPTOCOCCUS GROUP F Beta hemolytic streptococci are predictably susceptible to penicillin and other beta lactams. Susceptibility testing not routinely performed. WITHIN NORMAL RESPIRATORY FLORA Performed at [facility], [address], [place], [place] [address]    Report Status 06/06/2023 FINAL  Final  Body fluid culture w Gram Stain     Status: None   Collection Time: 06/03/23  4:35 PM   Specimen: Pleural Fluid  Result Value Ref Range Status   Specimen Description FLUID PLEURAL  Final   Special Requests NONE  Final   Gram Stain   Final    FEW WBC PRESENT,BOTH PMN AND  MONONUCLEAR NO ORGANISMS SEEN    Culture   Final    NO GROWTH 3 DAYS Performed at [facility], [address], [place], [place] [address]    Report Status 06/07/2023 FINAL  Final  Fungus Culture With Stain     Status: None (Preliminary result)   Collection Time: 06/03/23  4:35 PM   Specimen: Pleural, Left; Pleural Fluid  Result Value Ref Range Status   Fungus Stain Final report  Final    Comment: (NOTE) Performed At: [facility] [address] [place], [place] [id number] Jennette Shorter MD Ey:[id number]    Fungus (Mycology) Culture PENDING  Incomplete   Fungal Source FLUID  Final    Comment: PLEURAL Performed at [facility], [address], [place], [place] [address]   Fungus Culture Result     Status: None   Collection Time: 06/03/23  4:35 PM  Result Value Ref Range Status   Result 1 Comment  Final    Comment: (NOTE) KOH/Calcofluor preparation:  no fungus observed. Performed At: [facility] [address] [place], [place] [id number] Jennette Shorter MD Ey:[id number]   MRSA  Next Gen by PCR, Nasal     Status: None   Collection Time: 06/04/23 12:20 AM   Specimen: Nasal Mucosa; Nasal Swab  Result Value Ref Range Status   MRSA by PCR Next Gen NOT DETECTED NOT DETECTED Final    Comment: (NOTE) The GeneXpert MRSA Assay (FDA approved for NASAL specimens only), is one component of a comprehensive MRSA colonization surveillance program. It is not intended to diagnose MRSA infection nor to guide or monitor treatment for MRSA infections. Test performance is not FDA approved in patients less than 54 years old. Performed at [facility], [address], [place], [place] [address]   Culture, blood (Routine X 2) w Reflex to ID Panel     Status: None (Preliminary result)   Collection Time: 06/06/23  7:46 AM   Specimen: BLOOD RIGHT HAND  Result Value Ref Range Status   Specimen Description BLOOD RIGHT HAND  Final   Special Requests   Final     BOTTLES DRAWN AEROBIC AND ANAEROBIC Blood Culture results may not be optimal due to an inadequate volume of blood received in culture bottles   Culture   Final    NO GROWTH 4 DAYS Performed at [facility], [address] [place], [place] [address]    Report Status PENDING  Incomplete  Culture, blood (Routine X 2) w Reflex to ID Panel     Status: None (Preliminary result)   Collection Time: 06/06/23  7:46 AM   Specimen: BLOOD LEFT ARM  Result Value Ref Range Status   Specimen Description BLOOD LEFT ARM  Final   Special Requests   Final    BOTTLES DRAWN AEROBIC AND ANAEROBIC Blood Culture results may not be optimal due to an inadequate volume of blood received in culture bottles   Culture   Final    NO GROWTH 4 DAYS Performed at [facility], [address], [place], [place] [address]    Report Status PENDING  Incomplete    RADIOLOGY STUDIES/RESULTS: DG CHEST PORT 1 VIEW Result Date: 06/10/2023 CLINICAL DATA:  5626 Acute respiratory failure (HCC) 5626 [id number] Pleural effusion [id number] EXAM: PORTABLE CHEST 1 VIEW COMPARISON:  Chest radiograph from one day prior. FINDINGS: Two pigtail lower left chest tubes are stable. Stable cardiomediastinal silhouette with normal heart size. No pneumothorax. Stable mild blunting of the left costophrenic angle. No significant right pleural effusion. Similar mild volume loss in the left hemithorax with mild streaky left parahilar and left basilar opacities. IMPRESSION: 1. Stable left chest tubes. No pneumothorax. 2. Stable mild blunting of the left costophrenic angle, either scarring or residual small left pleural effusion. 3. Similar mild volume loss in the left hemithorax with mild streaky left parahilar and left basilar opacities. Electronically Signed   By: Selinda DELENA Blue M.D.   On: 06/10/2023 10:52   DG CHEST PORT 1 VIEW Result Date: 06/09/2023 CLINICAL DATA:  Evaluate for recurrent pleural effusion EXAM: PORTABLE CHEST 1 VIEW COMPARISON:   06/08/2023 FINDINGS: Cardiac shadow is stable. Two pigtail catheters are again noted on the left. No pneumothorax is noted. Minimal blunting of the costophrenic angle is noted. No significant increase in pleural effusion is noted. Persistent left basilar atelectasis is noted. IMPRESSION: No significant interval change from the prior exam. No significant increase in pleural effusion is noted. Electronically Signed   By: Oneil Devonshire M.D.   On: 06/09/2023 10:11   DG Chest Port 1 View Result Date: 06/08/2023 CLINICAL DATA:  Empyema. EXAM:  PORTABLE CHEST 1 VIEW COMPARISON:  06/06/2023. FINDINGS: Redemonstration of heterogeneous opacification of the left hemithorax, overall decreased in density since the prior study, suggesting interval decrease in the loculated left pleural effusion, likely status post additional left pleural drainage placement. No pneumothorax. There also superimposed areas of atelectasis and or pneumonia. Please refer to recent chest CT scan for additional details. There are 2 left-sided pleural drainage catheters. Right lung is grossly clear. No dense consolidation or lung collapse. Right lateral costophrenic angle is clear. No pneumothorax. Stable cardio-mediastinal silhouette. No acute osseous abnormalities. The soft tissues are within normal limits. IMPRESSION: *Interval decrease in the loculated left pleural effusion, status post left-sided additional pleural drainage catheter placement. In total, there are now 2 left-sided pleural drainage catheters. No pneumothorax seen. Electronically Signed   By: Ree Molt M.D.   On: 06/08/2023 15:53     LOS: 7 days   Donalda Applebaum, MD  [facility]    To contact the attending provider between 7A-7P or the covering provider during after hours 7P-7A, please log into the web site [email or web address] and access using universal [place] password for that web site. If you do not have the password, please call the hospital  operator.  06/10/2023, 12:23 PM

## 2023-06-11 ENCOUNTER — Inpatient Hospital Stay (HOSPITAL_COMMUNITY): Payer: MEDICAID

## 2023-06-11 LAB — CBC WITH DIFFERENTIAL/PLATELET
Abs Immature Granulocytes: 0 10*3/uL (ref 0.00–0.07)
Basophils Absolute: 0 10*3/uL (ref 0.0–0.1)
Basophils Relative: 0 %
Eosinophils Absolute: 0.2 10*3/uL (ref 0.0–0.5)
Eosinophils Relative: 2 %
HCT: 29.1 % — ABNORMAL LOW (ref 39.0–52.0)
Hemoglobin: 9.6 g/dL — ABNORMAL LOW (ref 13.0–17.0)
Lymphocytes Relative: 15 %
Lymphs Abs: 1.6 10*3/uL (ref 0.7–4.0)
MCH: 31 pg (ref 26.0–34.0)
MCHC: 33 g/dL (ref 30.0–36.0)
MCV: 93.9 fL (ref 80.0–100.0)
Monocytes Absolute: 0.5 10*3/uL (ref 0.1–1.0)
Monocytes Relative: 5 %
Neutro Abs: 8.1 10*3/uL — ABNORMAL HIGH (ref 1.7–7.7)
Neutrophils Relative %: 78 %
Platelets: 949 10*3/uL (ref 150–400)
RBC: 3.1 MIL/uL — ABNORMAL LOW (ref 4.22–5.81)
RDW: 13.9 % (ref 11.5–15.5)
WBC: 10.4 10*3/uL (ref 4.0–10.5)
nRBC: 0 % (ref 0.0–0.2)
nRBC: 0 /100{WBCs}

## 2023-06-11 LAB — CULTURE, BLOOD (ROUTINE X 2)
Culture: NO GROWTH
Culture: NO GROWTH

## 2023-06-11 LAB — BRAIN NATRIURETIC PEPTIDE: B Natriuretic Peptide: 13.6 pg/mL (ref 0.0–100.0)

## 2023-06-11 LAB — C-REACTIVE PROTEIN: CRP: 5.9 mg/dL — ABNORMAL HIGH (ref <1.0)

## 2023-06-11 LAB — PROCALCITONIN: Procalcitonin: 0.23 ng/mL

## 2023-06-11 LAB — PHOSPHORUS: Phosphorus: 4.8 mg/dL — ABNORMAL HIGH (ref 2.5–4.6)

## 2023-06-11 LAB — MAGNESIUM: Magnesium: 1.8 mg/dL (ref 1.7–2.4)

## 2023-06-11 MED ORDER — HEPARIN SODIUM (PORCINE) 5000 UNIT/ML IJ SOLN
5000.0000 [IU] | Freq: Three times a day (TID) | INTRAMUSCULAR | Status: DC
Start: 2023-06-11 — End: 2023-06-13
  Filled 2023-06-11 (×2): qty 1

## 2023-06-11 NOTE — Progress Notes (Signed)
 Paged Rapid response for the removal of chest tube.

## 2023-06-11 NOTE — Plan of Care (Signed)
  Problem: Education: Goal: Knowledge of General Education information will improve Description: Including pain rating scale, medication(s)/side effects and non-pharmacologic comfort measures Outcome: Progressing   Problem: Health Behavior/Discharge Planning: Goal: Ability to manage health-related needs will improve Outcome: Progressing   Problem: Clinical Measurements: Goal: Ability to maintain clinical measurements within normal limits will improve Outcome: Progressing Goal: Will remain free from infection Outcome: Progressing Goal: Diagnostic test results will improve Outcome: Progressing Goal: Respiratory complications will improve Outcome: Progressing Goal: Cardiovascular complication will be avoided Outcome: Progressing   Problem: Activity: Goal: Risk for activity intolerance will decrease 06/11/2023 1642 by Regino Sayre, RN Outcome: Progressing 06/11/2023 1639 by Regino Sayre, RN Outcome: Progressing   Problem: Nutrition: Goal: Adequate nutrition will be maintained 06/11/2023 1642 by Regino Sayre, RN Outcome: Progressing 06/11/2023 1639 by Regino Sayre, RN Outcome: Progressing   Problem: Coping: Goal: Level of anxiety will decrease Outcome: Progressing   Problem: Elimination: Goal: Will not experience complications related to bowel motility Outcome: Progressing Goal: Will not experience complications related to urinary retention Outcome: Progressing   Problem: Pain Managment: Goal: General experience of comfort will improve and/or be controlled 06/11/2023 1642 by Regino Sayre, RN Outcome: Progressing 06/11/2023 1639 by Regino Sayre, RN Outcome: Progressing   Problem: Safety: Goal: Ability to remain free from injury will improve Outcome: Progressing   Problem: Skin Integrity: Goal: Risk for impaired skin integrity will decrease Outcome: Progressing   Problem: Activity: Goal: Ability to tolerate increased activity will improve Outcome:  Progressing   Problem: Respiratory: Goal: Ability to maintain adequate ventilation will improve Outcome: Progressing Goal: Ability to maintain a clear airway will improve Outcome: Progressing

## 2023-06-11 NOTE — Progress Notes (Signed)
   06/11/23 1310  Provider Notification  Provider Name/Title Dr Lavada MARLA Stank  Date Provider Notified 06/11/23  Time Provider Notified 1310  Method of Notification Page (Secure chat)  Notification Reason Red med refusal  Provider response Evaluate remotely;No new orders  Date of Provider Response 06/11/23  Time of Provider Response 1310

## 2023-06-11 NOTE — Plan of Care (Signed)
  Problem: Education: Goal: Knowledge of General Education information will improve Description: Including pain rating scale, medication(s)/side effects and non-pharmacologic comfort measures Outcome: Progressing   Problem: Clinical Measurements: Goal: Will remain free from infection Outcome: Progressing Goal: Diagnostic test results will improve Outcome: Progressing Goal: Respiratory complications will improve Outcome: Progressing Goal: Cardiovascular complication will be avoided Outcome: Progressing   Problem: Activity: Goal: Risk for activity intolerance will decrease Outcome: Progressing   Problem: Nutrition: Goal: Adequate nutrition will be maintained Outcome: Progressing

## 2023-06-11 NOTE — Progress Notes (Addendum)
 PROGRESS NOTE        PATIENT DETAILS Name: Brent Bryant Age: 33 y.o. Sex: male Date of Birth: 02/25/1991 Admit Date: 06/02/2023 Admitting Physician [name], MD ERE:[name], [name], [name]  Brief Summary: Patient is a 33 y.o.  male with history polysubstance abuse, chronic HCV-who presented with severe left-sided chest pain/flank pain-found to have sepsis secondary to left lower lobe pneumonia with empyema  Significant events: 2/01>> admit to [facility] consult-chest tube placed (pleural fluid-LDH 1174, WBC 7700) 2/2>> intrapleural lytics 2/3>> intrapleural lytics 2/4>> CT chest: Worsening left pleural effusion 2/5>> second chest tube placed by IR 2/6>> intrapleural lytics 2/7>> refused intrapleural lytics due to worsening pain.  Significant studies: 1/31>> CT abdomen/pelvis: Loculated pleural effusion-hide left lower PNA. 2/02>> TTE: EF 65-70% 2/04>> CT chest: Worsening loculated left pleural effusion despite placement of pigtail 2/04>> CT maxillofacial: Advanced dental/periodontal disease. 2/9 >>CT - 1. There is a very small sliver of a pneumothorax persisting anteriorly in the upper to mid chest, much smaller than previously noted with resolution of the prior hydro component of this collection. 2. Most of the large volume of pleural fluid on February 4 is now gone with a small residual loculated pleural fluid collection in the posterior left chest base extending up the paraspinal area medially to the level of the carina. 3. Patchy airspace disease in the left lower lobe along with irregular cavitation and volume loss to the superior segment. 4. Small consolidation anteromedially in the right upper lobe which has improved. 5. Minimal pericardial fluid, unchanged. 6. Mildly prominent left hilar lymph nodes, difficult to evaluate without contrast.   Significant microbiology data: 1/31>> COVID/influenza/RSV PCR: Negative 2/01>> pleural fluid culture: No  growth 2/01>> sputum culture: rare strep group F 2/01>> blood culture: No growth 2/04>> blood culture: No growth  Procedures: 2/01>> chest tube placement by PCCM 2/05>> second chest tube placement by IR  Consults: PCCM ID  Subjective:  Patient in bed, appears comfortable, denies any headache, no fever, some left-sided chest tube site discomfort, no shortness of breath , no abdominal pain. No new focal weakness.   Objective: Vitals: Blood pressure 114/77, pulse 86, temperature 98.2 F (36.8 C), temperature source Oral, resp. rate 18, height 5' 8 (1.727 m), weight 59.9 kg, SpO2 99%.   Exam:  Awake Alert, No new F.N deficits, Normal affect [place].AT,PERRAL Supple Neck, No JVD,   Symmetrical Chest wall movement, Good air movement bilaterally, CTAB, 2 Left Ch.tubes RRR,No Gallops, Rubs or new Murmurs,  +ve B.Sounds, Abd Soft, No tenderness,   No Cyanosis, Clubbing or edema   Assessment/Plan:  Sepsis secondary to left lower lobe PNA with empyema-s/p chest tube placement by PCCM on 2/1 Sepsis physiology continues to improve First chest tube placed by PCCM on 2/1-received lytics on 2/2 and 2/3.  Unfortunately pleural effusion continued to enlarge-requiring second chest tube placement on 2/5.  Received lytics  again on 2/6.  Refused lytics on 2/7. Remains on Unasyn , per ID after left-sided chest tubes are removed he could be transition to oral Augmentin  stop date 07/05/2023. PCCM has ordered another CT chest to reassess-whether chest tube can be removed. ID/PCCM following  Dental caries/poor oral hygiene Outpatient dentistry follow-up-no inpatient dentist coverage at [facility].  Chronic HCV Outpatient treatment if patient follows up in the ID clinic.  Normocytic anemia Secondary to acute illness Follow CBC  Hyponatremia Mild Asymptomatic Repeat  electrolytes tomorrow  History of polysubstance abuse-UDS positive for opiates/cocaine/THC Per patient-he has been clean for more than  6 months--suspects that his UDS is positive as he was exposed to cocaine that his brother was using in his apartment.  Underweight: Cachectic/emaciated-will get nutrition evaluation HIV negative TSH stable  Constipation Finally had a BM on 2/7 Continue MiraLAX /senna.    Nutrition Status: Nutrition Problem: Severe Malnutrition Etiology: social / environmental circumstances Signs/Symptoms: severe muscle depletion, severe fat depletion Interventions: MVI, Hormel Shake    Estimated body mass index is 20.08 kg/m as calculated from the following:   Height as of this encounter: 5' 8 (1.727 m).   Weight as of this encounter: 59.9 kg.   Code status:   Code Status: Full Code   DVT Prophylaxis: Place and maintain sequential compression device Start: 06/05/23 2016, add Heparin  06/11/23   Family Communication: None at bedside   Disposition Plan: Status is: Inpatient Remains inpatient appropriate because:    Planned Discharge Destination:Home   Diet: Diet Order             Diet regular Fluid consistency: Thin  Diet effective now                    MEDICATIONS: Scheduled Meds:  bisacodyl   10 mg Rectal Once   feeding supplement  237 mL Oral BID BM   guaiFENesin   600 mg Oral BID   lidocaine   2 patch Transdermal Q24H   multivitamin with minerals  1 tablet Oral Daily   polyethylene glycol  17 g Oral BID   senna-docusate  2 tablet Oral QHS   sodium chloride  flush  10 mL Intrapleural Q8H   Continuous Infusions:  ampicillin -sulbactam (UNASYN ) IV 3 g (06/11/23 0517)   PRN Meds:.acetaminophen  **OR** acetaminophen , bisacodyl , levalbuterol , LORazepam , metoprolol  tartrate, morphine  injection, ondansetron  **OR** ondansetron  (ZOFRAN ) IV, oxyCODONE , sodium phosphate   I have personally reviewed following labs and imaging studies  LABORATORY DATA:   Data Review:    Recent Labs  Lab 06/05/23 0211 06/05/23 2005 06/07/23 0445 06/08/23 0437 06/09/23 0546 06/10/23 0524  06/11/23 0626  WBC 32.5*   < > 17.6* 16.0* 14.1* 11.4* 10.4  HGB 10.9*   < > 9.6* 10.4* 9.7* 9.9* 9.6*  HCT 31.5*   < > 28.2* 31.1* 28.9* 30.0* 29.1*  PLT 411*   < > 513* 734* 831* 899* 949*  MCV 88.7   < > 91.0 91.5 91.7 92.9 93.9  MCH 30.7   < > 31.0 30.6 30.8 30.7 31.0  MCHC 34.6   < > 34.0 33.4 33.6 33.0 33.0  RDW 13.9   < > 14.6 14.3 14.1 13.9 13.9  LYMPHSABS 2.4  --  2.6  --   --   --  1.6  MONOABS 3.3*  --  1.5*  --   --   --  0.5  EOSABS 0.1  --  0.1  --   --   --  0.2  BASOSABS 0.1  --  0.1  --   --   --  0.0   < > = values in this interval not displayed.    Recent Labs  Lab 06/05/23 0211 06/06/23 0435 06/07/23 0445 06/08/23 0437 06/09/23 0546 06/10/23 0524 06/11/23 0626  NA 130* 131* 128* 130* 129* 134*  --   K 4.3 4.4 4.1 4.3 4.6 4.5  --   CL 102 102 97* 97* 95* 93*  --   CO2 21* 22 24 26 26 24   --  ANIONGAP 7 7 7 7 8  17*  --   GLUCOSE 97 97 111* 90 94 102*  --   BUN 15 12 6  <5* 5* 6  --   CREATININE 0.58* 0.70 0.47* 0.51* 0.63 0.58*  --   AST  --  17 18  --   --   --   --   ALT  --  11 11  --   --   --   --   ALKPHOS  --  74 72  --   --   --   --   BILITOT  --  0.5 0.3  --   --   --   --   ALBUMIN <1.5* <1.5* <1.5*  --   --   --   --   CRP  --   --   --   --   --   --  5.9*  PROCALCITON  --   --   --   --   --   --  0.23  TSH  --   --  4.249  --   --   --   --   BNP  --   --   --   --   --   --  13.6  MG 1.8  --  1.6* 1.9  --   --  1.8  PHOS 5.5*  --  3.8 4.3  --   --  4.8*  CALCIUM 7.3* 7.1* 7.4* 7.5* 8.0* 9.0  --       Recent Labs  Lab 06/05/23 0211 06/06/23 0435 06/07/23 0445 06/08/23 0437 06/09/23 0546 06/10/23 0524 06/11/23 0626  CRP  --   --   --   --   --   --  5.9*  PROCALCITON  --   --   --   --   --   --  0.23  TSH  --   --  4.249  --   --   --   --   BNP  --   --   --   --   --   --  13.6  MG 1.8  --  1.6* 1.9  --   --  1.8  CALCIUM 7.3* 7.1* 7.4* 7.5* 8.0* 9.0  --      Micro Results Recent Results (from the past 240 hours)   Resp panel by RT-PCR (RSV, Flu A&B, Covid) Anterior Nasal Swab     Status: None   Collection Time: 06/02/23  6:16 PM   Specimen: Anterior Nasal Swab  Result Value Ref Range Status   SARS Coronavirus 2 by RT PCR NEGATIVE NEGATIVE Final    Comment: (NOTE) SARS-CoV-2 target nucleic acids are NOT DETECTED.  The SARS-CoV-2 RNA is generally detectable in upper respiratory specimens during the acute phase of infection. The lowest concentration of SARS-CoV-2 viral copies this assay can detect is 138 copies/mL. A negative result does not preclude SARS-Cov-2 infection and should not be used as the sole basis for treatment or other patient management decisions. A negative result may occur with  improper specimen collection/handling, submission of specimen other than nasopharyngeal swab, presence of viral mutation(s) within the areas targeted by this assay, and inadequate number of viral copies(<138 copies/mL). A negative result must be combined with clinical observations, patient history, and epidemiological information. The expected result is Negative.  Fact Sheet for Patients:  bloggercourse.com  Fact Sheet for Healthcare Providers:  seriousbroker.it  This test is no t yet  approved or cleared by the [place]  FDA and  has been authorized for detection and/or diagnosis of SARS-CoV-2 by FDA under an Emergency Use Authorization (EUA). This EUA will remain  in effect (meaning this test can be used) for the duration of the COVID-19 declaration under Section 564(b)(1) of the Act, 21 U.S.C.section 360bbb-3(b)(1), unless the authorization is terminated  or revoked sooner.       Influenza A by PCR NEGATIVE NEGATIVE Final   Influenza B by PCR NEGATIVE NEGATIVE Final    Comment: (NOTE) The Xpert Xpress SARS-CoV-2/FLU/RSV plus assay is intended as an aid in the diagnosis of influenza from Nasopharyngeal swab specimens and should not be used  as a sole basis for treatment. Nasal washings and aspirates are unacceptable for Xpert Xpress SARS-CoV-2/FLU/RSV testing.  Fact Sheet for Patients: bloggercourse.com  Fact Sheet for Healthcare Providers: seriousbroker.it  This test is not yet approved or cleared by the [place]  FDA and has been authorized for detection and/or diagnosis of SARS-CoV-2 by FDA under an Emergency Use Authorization (EUA). This EUA will remain in effect (meaning this test can be used) for the duration of the COVID-19 declaration under Section 564(b)(1) of the Act, 21 U.S.C. section 360bbb-3(b)(1), unless the authorization is terminated or revoked.     Resp Syncytial Virus by PCR NEGATIVE NEGATIVE Final    Comment: (NOTE) Fact Sheet for Patients: bloggercourse.com  Fact Sheet for Healthcare Providers: seriousbroker.it  This test is not yet approved or cleared by the [place]  FDA and has been authorized for detection and/or diagnosis of SARS-CoV-2 by FDA under an Emergency Use Authorization (EUA). This EUA will remain in effect (meaning this test can be used) for the duration of the COVID-19 declaration under Section 564(b)(1) of the Act, 21 U.S.C. section 360bbb-3(b)(1), unless the authorization is terminated or revoked.  Performed at [facility], [address], [place], [place] [address]   Culture, blood (Routine X 2) w Reflex to ID Panel     Status: None   Collection Time: 06/03/23  1:30 AM   Specimen: BLOOD LEFT FOREARM  Result Value Ref Range Status   Specimen Description   Final    BLOOD LEFT FOREARM Performed at [facility], [address], [place], [place] [address]    Special Requests   Final    BOTTLES DRAWN AEROBIC AND ANAEROBIC Blood Culture results may not be optimal due to an inadequate volume of blood received in culture  bottles Performed at [facility], [address] [place], [place] [address]    Culture   Final    NO GROWTH 5 DAYS Performed at [facility], [address], [place], [place] [address]    Report Status 06/08/2023 FINAL  Final  Culture, blood (Routine X 2) w Reflex to ID Panel     Status: None   Collection Time: 06/03/23  1:39 AM   Specimen: BLOOD LEFT HAND  Result Value Ref Range Status   Specimen Description   Final    BLOOD LEFT HAND Performed at [facility], [address] [place], [place] [address]    Special Requests   Final    BOTTLES DRAWN AEROBIC AND ANAEROBIC Blood Culture adequate volume Performed at [facility], [address] [place], [place] [address]    Culture   Final    NO GROWTH 5 DAYS Performed at [facility], [address] [place], [place] [address]    Report Status 06/08/2023 FINAL  Final  Expectorated Sputum Assessment w Gram Stain, Rflx to Resp Cult     Status: None   Collection Time: 06/03/23  4:48 AM   Specimen: Sputum  Result Value Ref Range Status   Specimen Description   Final    SPUTUM Performed at [facility], [address] [place], [place] [address]    Special Requests   Final    Normal Performed at [facility], [address], [place], [place] [address]    Sputum evaluation   Final    THIS SPECIMEN IS ACCEPTABLE FOR SPUTUM CULTURE Performed at [facility], [address], [place], [place] [address]    Report Status 06/03/2023 FINAL  Final  Culture, Respiratory w Gram Stain     Status: None   Collection Time: 06/03/23  4:48 AM   Specimen: SPU  Result Value Ref Range Status   Specimen Description SPUTUM  Final   Special Requests Normal Reflexed from [id number]  Final   Gram Stain   Final    MODERATE WBC PRESENT,BOTH PMN AND MONONUCLEAR FEW GRAM POSITIVE COCCI IN PAIRS FEW GRAM NEGATIVE RODS RARE YEAST WITH  PSEUDOHYPHAE    Culture   Final    RARE STREPTOCOCCUS GROUP F Beta hemolytic streptococci are predictably susceptible to penicillin and other beta lactams. Susceptibility testing not routinely performed. WITHIN NORMAL RESPIRATORY FLORA Performed at [facility], [address], [place], [place] [address]    Report Status 06/06/2023 FINAL  Final  Body fluid culture w Gram Stain     Status: None   Collection Time: 06/03/23  4:35 PM   Specimen: Pleural Fluid  Result Value Ref Range Status   Specimen Description FLUID PLEURAL  Final   Special Requests NONE  Final   Gram Stain   Final    FEW WBC PRESENT,BOTH PMN AND MONONUCLEAR NO ORGANISMS SEEN    Culture   Final    NO GROWTH 3 DAYS Performed at [facility], [address], [place], [place] [address]    Report Status 06/07/2023 FINAL  Final  Fungus Culture With Stain     Status: None (Preliminary result)   Collection Time: 06/03/23  4:35 PM   Specimen: Pleural, Left; Pleural Fluid  Result Value Ref Range Status   Fungus Stain Final report  Final    Comment: (NOTE) Performed At: [facility] [address] [place], [place] [id number] Jennette Shorter MD Ey:[id number]    Fungus (Mycology) Culture PENDING  Incomplete   Fungal Source FLUID  Final    Comment: PLEURAL Performed at [facility], [address] [place], [place] [address]   Fungus Culture Result     Status: None   Collection Time: 06/03/23  4:35 PM  Result Value Ref Range Status   Result 1 Comment  Final    Comment: (NOTE) KOH/Calcofluor preparation:  no fungus observed. Performed At: [facility] [address] [place], [place] [id number] Jennette Shorter MD Ey:[id number]   MRSA Next Gen by PCR, Nasal     Status: None   Collection Time: 06/04/23 12:20 AM   Specimen: Nasal Mucosa; Nasal Swab  Result Value Ref Range Status   MRSA by PCR Next Gen NOT DETECTED NOT DETECTED Final    Comment: (NOTE) The GeneXpert MRSA Assay (FDA  approved for NASAL specimens only), is one component of a comprehensive MRSA colonization surveillance program. It is not intended to diagnose MRSA infection nor to guide or monitor treatment  for MRSA infections. Test performance is not FDA approved in patients less than 86 years old. Performed at [facility], [address], [place], [place] [address]   Culture, blood (Routine X 2) w Reflex to ID Panel     Status: None (Preliminary result)   Collection Time: 06/06/23  7:46 AM   Specimen: BLOOD RIGHT HAND  Result Value Ref Range Status   Specimen Description BLOOD RIGHT HAND  Final   Special Requests   Final    BOTTLES DRAWN AEROBIC AND ANAEROBIC Blood Culture results may not be optimal due to an inadequate volume of blood received in culture bottles   Culture   Final    NO GROWTH 4 DAYS Performed at [facility], [address], [place], [place] [address]    Report Status PENDING  Incomplete  Culture, blood (Routine X 2) w Reflex to ID Panel     Status: None (Preliminary result)   Collection Time: 06/06/23  7:46 AM   Specimen: BLOOD LEFT ARM  Result Value Ref Range Status   Specimen Description BLOOD LEFT ARM  Final   Special Requests   Final    BOTTLES DRAWN AEROBIC AND ANAEROBIC Blood Culture results may not be optimal due to an inadequate volume of blood received in culture bottles   Culture   Final    NO GROWTH 4 DAYS Performed at [facility], [address] [place], [place] [address]    Report Status PENDING  Incomplete    Radiology Reports CT CHEST WO CONTRAST Result Date: 06/11/2023 CLINICAL DATA:  Empyema follow-up. EXAM: CT CHEST WITHOUT CONTRAST TECHNIQUE: Multidetector CT imaging of the chest was performed following the standard protocol without IV contrast. RADIATION DOSE REDUCTION: This exam was performed according to the departmental dose-optimization program which includes automated exposure control, adjustment of the mA and/or kV according to  patient size and/or use of iterative reconstruction technique. COMPARISON:  The last chest CT was chest CT without contrast 06/06/2023. Comparison is also made with subsequent portable chest films from February 6-8, 2025. FINDINGS: Cardiovascular: The cardiac size is normal. There is minimal pericardial fluid, unchanged. The aorta and great vessels are normal in caliber. The pulmonary arteries and veins are normal in caliber. There are no visible calcifications in the aorta and coronary arteries. Mediastinum/Nodes: There appears to be a few mildly prominent left hilar lymph nodes, but this is difficult to evaluate without contrast. No new contour deforming abnormality is seen. There is probably no further intrathoracic adenopathy but subject contrast is further limited by very little body fat. Lower poles of the thyroid gland, axillary spaces, thoracic esophagus are unremarkable. The trachea and both main bronchi are clear. Lungs/Pleura: A pigtail chest tube continues to be present in the medial left chest base entering from anterolaterally then curving posteriorly abutting the mediastinum next to the distal esophagus. The more recently placed chest tube enters through the posterolateral left seventh intercostal space extending 1 intercostal level superiorly with the pigtail superimposing medially to the sixth intercostal space. There is a very small sliver of a pneumothorax persisting anteriorly in the upper to mid chest on 4:63, much smaller than previously noted with resolution of the prior hydro component of this collection. There is probably no change in overall aeration over recent chest films but certainly dramatic improvement since February 4. Most of the large volume of pleural fluid on February 4 is now gone with a small residual loculated pleural fluid collection in  the posterior left chest base extending up the paraspinal area medially to the level of the carina. There is patchy airspace disease in the  left lower lobe along with irregular cavitation and volume loss to the superior segment. In the lingular base there is coarse linear atelectasis or scarring. There is a small consolidation anteromedially in the right upper lobe which has improved. Rest of the lungs are clear. Layering right pleural fluid on the prior study has also resolved. Upper Abdomen: No acute abnormality. Musculoskeletal: No chest wall mass or suspicious bone lesions identified. IMPRESSION: 1. There is a very small sliver of a pneumothorax persisting anteriorly in the upper to mid chest, much smaller than previously noted with resolution of the prior hydro component of this collection. 2. Most of the large volume of pleural fluid on February 4 is now gone with a small residual loculated pleural fluid collection in the posterior left chest base extending up the paraspinal area medially to the level of the carina. 3. Patchy airspace disease in the left lower lobe along with irregular cavitation and volume loss to the superior segment. 4. Small consolidation anteromedially in the right upper lobe which has improved. 5. Minimal pericardial fluid, unchanged. 6. Mildly prominent left hilar lymph nodes, difficult to evaluate without contrast. Electronically Signed   By: Francis Quam M.D.   On: 06/11/2023 07:23   DG CHEST PORT 1 VIEW Result Date: 06/10/2023 CLINICAL DATA:  5626 Acute respiratory failure (HCC) 5626 [id number] Pleural effusion [id number] EXAM: PORTABLE CHEST 1 VIEW COMPARISON:  Chest radiograph from one day prior. FINDINGS: Two pigtail lower left chest tubes are stable. Stable cardiomediastinal silhouette with normal heart size. No pneumothorax. Stable mild blunting of the left costophrenic angle. No significant right pleural effusion. Similar mild volume loss in the left hemithorax with mild streaky left parahilar and left basilar opacities. IMPRESSION: 1. Stable left chest tubes. No pneumothorax. 2. Stable mild blunting of the left  costophrenic angle, either scarring or residual small left pleural effusion. 3. Similar mild volume loss in the left hemithorax with mild streaky left parahilar and left basilar opacities. Electronically Signed   By: Selinda DELENA Blue M.D.   On: 06/10/2023 10:52   DG CHEST PORT 1 VIEW Result Date: 06/09/2023 CLINICAL DATA:  Evaluate for recurrent pleural effusion EXAM: PORTABLE CHEST 1 VIEW COMPARISON:  06/08/2023 FINDINGS: Cardiac shadow is stable. Two pigtail catheters are again noted on the left. No pneumothorax is noted. Minimal blunting of the costophrenic angle is noted. No significant increase in pleural effusion is noted. Persistent left basilar atelectasis is noted. IMPRESSION: No significant interval change from the prior exam. No significant increase in pleural effusion is noted. Electronically Signed   By: Oneil Devonshire M.D.   On: 06/09/2023 10:11   DG Chest Port 1 View Result Date: 06/08/2023 CLINICAL DATA:  Empyema. EXAM: PORTABLE CHEST 1 VIEW COMPARISON:  06/06/2023. FINDINGS: Redemonstration of heterogeneous opacification of the left hemithorax, overall decreased in density since the prior study, suggesting interval decrease in the loculated left pleural effusion, likely status post additional left pleural drainage placement. No pneumothorax. There also superimposed areas of atelectasis and or pneumonia. Please refer to recent chest CT scan for additional details. There are 2 left-sided pleural drainage catheters. Right lung is grossly clear. No dense consolidation or lung collapse. Right lateral costophrenic angle is clear. No pneumothorax. Stable cardio-mediastinal silhouette. No acute osseous abnormalities. The soft tissues are within normal limits. IMPRESSION: *Interval decrease in the loculated left pleural effusion, status  post left-sided additional pleural drainage catheter placement. In total, there are now 2 left-sided pleural drainage catheters. No pneumothorax seen. Electronically Signed    By: Ree Molt M.D.   On: 06/08/2023 15:53   CT [facility] PLEURAL DRAIN W/INDWELL CATH W/IMG GUIDE Result Date: 06/07/2023 INDICATION: 33 year old male with history of complex left empyema. EXAM: CT PERC PLEURAL DRAIN W/INDWELL CATH W/IMG GUIDE COMPARISON:  None Available. MEDICATIONS: The patient is currently admitted to the hospital and receiving intravenous antibiotics. The antibiotics were administered within an appropriate time frame prior to the initiation of the procedure. ANESTHESIA/SEDATION: Moderate (conscious) sedation was employed during this procedure. A total of Versed  1 mg and Fentanyl  50 mcg was administered intravenously. Moderate Sedation Time: 10 minutes. The patient's level of consciousness and vital signs were monitored continuously by radiology nursing throughout the procedure under my direct supervision. CONTRAST:  None COMPLICATIONS: None immediate. PROCEDURE: RADIATION DOSE REDUCTION: This exam was performed according to the departmental dose-optimization program which includes automated exposure control, adjustment of the mA and/or kV according to patient size and/or use of iterative reconstruction technique. Informed written consent was obtained from the patient after a discussion of the risks, benefits and alternatives to treatment. The patient was placed supine, partially right lateral decubitus on the CT gantry and a pre procedural CT was performed re-demonstrating the known fluid collection within the left pleural space. The procedure was planned. A timeout was performed prior to the initiation of the procedure. The left posterolateral and inferior thorax was prepped and draped in the usual sterile fashion. The overlying soft tissues were anesthetized with 1% lidocaine  with epinephrine . Appropriate trajectory was planned with the use of a 22 gauge spinal needle. An 18 gauge trocar needle was advanced into the pleural space and a short Amplatz super stiff wire was coiled within the  collection. Appropriate positioning was confirmed with a limited CT scan. The tract was serially dilated allowing placement of a 14 French all-purpose drainage catheter. Appropriate positioning was confirmed with a limited postprocedural CT scan. The tube was connected to a pleurovac and sutured in place. A dressing was placed. The patient tolerated the procedure well without immediate post procedural complication. IMPRESSION: Successful CT guided placement of a 63 French all purpose drain catheter into the posterolateral and inferior left pleural space with aspiration of serous fluid. Ester Sides, MD [facility] Electronically Signed   By: Ester Sides M.D.   On: 06/07/2023 16:53   CT MAXILLOFACIAL W CONTRAST Result Date: 06/06/2023 CLINICAL DATA:  Dental caries EXAM: CT MAXILLOFACIAL WITH CONTRAST TECHNIQUE: Multidetector CT imaging of the maxillofacial structures was performed with intravenous contrast. Multiplanar CT image reconstructions were also generated. RADIATION DOSE REDUCTION: This exam was performed according to the departmental dose-optimization program which includes automated exposure control, adjustment of the mA and/or kV according to patient size and/or use of iterative reconstruction technique. CONTRAST:  75mL OMNIPAQUE  IOHEXOL  350 MG/ML SOLN COMPARISON:  None Available. FINDINGS: Osseous: No traumatic facial bone finding. Dental: Unerupted right maxillary tooth 1. Multiple caries evident affecting the anterior maxillary dentition from tooth 4 through tooth 12. Lucency around the roots particularly at teeth 8, 9 and 10. With respect to the mandibular dentition, there is advanced decay with multiple caries. Unfortunately there is a good bit of motion degradation. Pronounced lucency adjacent to the roots of the left mandibular dentition with a confluent root abscess in the right mandible affecting teeth 30 and 31. Orbits: Normal  Sinuses: Clear Soft  tissues: Negative Limited intracranial: Normal IMPRESSION: 1. Advanced dental and periodontal disease as outlined above. Electronically Signed   By: Oneil Officer M.D.   On: 06/06/2023 22:39   CT CHEST WO CONTRAST Result Date: 06/06/2023 CLINICAL DATA:  Pneumonia complication EXAM: CT CHEST WITHOUT CONTRAST TECHNIQUE: Multidetector CT imaging of the chest was performed following the standard protocol without IV contrast. RADIATION DOSE REDUCTION: This exam was performed according to the departmental dose-optimization program which includes automated exposure control, adjustment of the mA and/or kV according to patient size and/or use of iterative reconstruction technique. COMPARISON:  CT abdomen 06/02/2023 FINDINGS: Cardiovascular: Heart size normal. Blood pool is hypodense compared to the interventricular septum suggesting anemia. Trace pericardial fluid. Central great vessels unremarkable. Mediastinum/Nodes: Anterior mediastinal soft tissue density presumably residual thymic tissue. No definite mass or adenopathy, sensitivity decreased without contrast. Lungs/Pleura: Progressive moderate partially loculated left pleural effusion despite placement of pigtail chest tube anteromedially at the lung base. Scattered loculated pleural gas bubbles may be related to chest tube placement versus infection. Persistent dense consolidation of the left lower lobe. Worsening dependent consolidation/atelectasis in the lingula and left upper lobe. Small right pleural effusion has developed. Consolidation with air bronchograms posteriorly in the right lower lobe. Subsegmental consolidation/atelectasis anteromedially in the right upper lobe. Upper Abdomen: No acute findings. Musculoskeletal: No chest wall mass or suspicious bone lesions identified. IMPRESSION: 1. Progressive moderate partially loculated left pleural effusion despite placement of pigtail chest tube. 2. Worsening dependent consolidation/atelectasis  in the lingula and left upper lobe. 3. Persistent dense consolidation of the left lower lobe. 4. New small right pleural effusion. 5. New consolidation/atelectasis posteriorly in the right lower lobe. Electronically Signed   By: JONETTA Faes M.D.   On: 06/06/2023 16:52   DG Chest Port 1V same Day Result Date: 06/06/2023 CLINICAL DATA:  [id number] SOB (shortness of breath) [id number] EXAM: PORTABLE CHEST 1 VIEW COMPARISON:  Chest x-ray 06/05/2023, CT abdomen pelvis 06/02/2023 FINDINGS: Left chest tube with pigtail overlying the lower medial left hemithorax. The heart and mediastinal contours are within normal limits. Left lung airspace opacity. No pulmonary edema. Interval increase in size of a loculated moderate to large volume left pleural effusion. No right pleural effusion. No pneumothorax. No acute osseous abnormality. IMPRESSION: 1. Interval increase in size of a loculated moderate to large volume left pleural effusion. 2. Underlying left lung airspace opacity may represent combination of infection/inflammation and atelectasis. 3. Left chest tube stable. Electronically Signed   By: Morgane  Naveau M.D.   On: 06/06/2023 07:59   DG CHEST PORT 1 VIEW Result Date: 06/05/2023 CLINICAL DATA:  Pain EXAM: PORTABLE CHEST 1 VIEW COMPARISON:  Chest x-ray 06/05/2023 FINDINGS: Left-sided chest tube is unchanged in position. Left pleural effusion which appears partially loculated is unchanged from prior. There is no pneumothorax. There is a new band of opacity in the right suprahilar region. Patchy opacities in the left lower lung have increased. Cardiomediastinal silhouette is within normal limits. No acute fractures are seen. IMPRESSION: 1. Left-sided chest tube is unchanged in position. 2. Stable partially loculated left pleural effusion. 3. New band of opacity in the right suprahilar region. 4. Increasing left basilar air atelectasis/airspace disease. Electronically Signed   By: Greig Pique M.D.   On: 06/05/2023 18:30   DG  CHEST PORT 1 VIEW Result Date: 06/05/2023 CLINICAL DATA:  Chest tube in place. EXAM: PORTABLE CHEST 1 VIEW COMPARISON:  Radiographs 06/04/2023 and 06/03/2023. Abdominal CT 06/02/2023. FINDINGS: 0527 hours. Left pigtail chest tube is  unchanged in position, overlying the medial left lung base. Lobulated left pleural effusion appears mildly decreased in volume, and there is improved aeration of the left lung. The right lung is clear. No evidence of pneumothorax. The visualized heart size and mediastinal contours are stable. IMPRESSION: Mild decrease in volume of left pleural effusion with improved aeration of the left lung. No evidence of pneumothorax. Electronically Signed   By: Elsie Perone M.D.   On: 06/05/2023 10:12   ECHOCARDIOGRAM COMPLETE Result Date: 06/04/2023    ECHOCARDIOGRAM REPORT   Patient Name:   Partick Musselman Date of Exam: 06/04/2023 Medical Rec #:  [id number]    Height:       68.0 in Accession #:    [id number]   Weight:       130.1 lb Date of Birth:  [birth year]    BSA:          1.702 m Patient Age:    32 years     BP:           119/74 mmHg Patient Gender: M            HR:           111 bpm. Exam Location:  Inpatient Procedure: 2D Echo, Cardiac Doppler and Color Doppler Indications:    Endocarditis  History:        Patient has no prior history of Echocardiogram examinations.  Sonographer:    Amy Chionchio Referring Phys: TORIBIO JAYSON SHARPS IMPRESSIONS  1. Left ventricular ejection fraction, by estimation, is 65 to 70%. Left ventricular ejection fraction by 2D MOD biplane is 68.5 %. The left ventricle has normal function. The left ventricle has no regional wall motion abnormalities. Left ventricular diastolic parameters were normal.  2. Right ventricular systolic function is normal. The right ventricular size is normal. Tricuspid regurgitation signal is inadequate for assessing PA pressure.  3. The mitral valve is grossly normal. No evidence of mitral valve regurgitation.  4. The aortic valve was not well  visualized. Aortic valve regurgitation is not visualized.  5. The inferior vena cava is normal in size with <50% respiratory variability, suggesting right atrial pressure of 8 mmHg. Comparison(s): No prior Echocardiogram. Conclusion(s)/Recommendation(s): No evidence of valvular vegetations on this transthoracic echocardiogram. Consider a transesophageal echocardiogram to exclude infective endocarditis if clinically indicated. FINDINGS  Left Ventricle: Left ventricular ejection fraction, by estimation, is 65 to 70%. Left ventricular ejection fraction by 2D MOD biplane is 68.5 %. The left ventricle has normal function. The left ventricle has no regional wall motion abnormalities. The left ventricular internal cavity size was normal in size. There is no left ventricular hypertrophy. Left ventricular diastolic parameters were normal. Right Ventricle: The right ventricular size is normal. No increase in right ventricular wall thickness. Right ventricular systolic function is normal. Tricuspid regurgitation signal is inadequate for assessing PA pressure. Left Atrium: Left atrial size was normal in size. Right Atrium: Right atrial size was normal in size. Pericardium: There is no evidence of pericardial effusion. Mitral Valve: The mitral valve is grossly normal. No evidence of mitral valve regurgitation. MV peak gradient, 2.2 mmHg. The mean mitral valve gradient is 1.0 mmHg. Tricuspid Valve: The tricuspid valve is normal in structure. Tricuspid valve regurgitation is not demonstrated. Aortic Valve: The aortic valve was not well visualized. Aortic valve regurgitation is not visualized. Aortic valve mean gradient measures 5.0 mmHg. Aortic valve peak gradient measures 10.8 mmHg. Aortic valve area, by VTI measures 2.26 cm. Pulmonic Valve: The pulmonic valve  was normal in structure. Pulmonic valve regurgitation is not visualized. Aorta: The aortic root and ascending aorta are structurally normal, with no evidence of  dilitation. Venous: The inferior vena cava is normal in size with less than 50% respiratory variability, suggesting right atrial pressure of 8 mmHg. IAS/Shunts: No atrial level shunt detected by color flow Doppler.  LEFT VENTRICLE PLAX 2D                        Biplane EF (MOD) LVIDd:         4.60 cm         LV Biplane EF:   Left LVIDs:         3.60 cm                          ventricular LV PW:         0.80 cm                          ejection LV IVS:        0.60 cm                          fraction by LVOT diam:     2.10 cm                          2D MOD LV SV:         46                               biplane is LV SV Index:   27                               68.5 %. LVOT Area:     3.46 cm                                Diastology                                LV e' medial:    11.20 cm/s LV Volumes (MOD)               LV E/e' medial:  7.7 LV vol d, MOD    101.0 ml      LV e' lateral:   17.00 cm/s A2C:                           LV E/e' lateral: 5.1 LV vol d, MOD    126.0 ml A4C: LV vol s, MOD    25.3 ml A2C: LV vol s, MOD    50.4 ml A4C: LV SV MOD A2C:   75.7 ml LV SV MOD A4C:   126.0 ml LV SV MOD BP:    87.9 ml RIGHT VENTRICLE          IVC RV Basal diam:  2.70 cm  IVC diam: 1.50 cm TAPSE (M-mode): 2.3 cm LEFT ATRIUM             Index  RIGHT ATRIUM           Index LA Vol (A2C):   50.3 ml 29.55 ml/m  RA Area:     14.30 cm LA Vol (A4C):   44.7 ml 26.26 ml/m  RA Volume:   36.70 ml  21.56 ml/m LA Biplane Vol: 47.9 ml 28.14 ml/m  AORTIC VALVE                     PULMONIC VALVE AV Area (Vmax):    2.08 cm      PV Vmax:       0.96 m/s AV Area (Vmean):   2.31 cm      PV Peak grad:  3.6 mmHg AV Area (VTI):     2.26 cm AV Vmax:           164.00 cm/s AV Vmean:          107.000 cm/s AV VTI:            0.204 m AV Peak Grad:      10.8 mmHg AV Mean Grad:      5.0 mmHg LVOT Vmax:         98.40 cm/s LVOT Vmean:        71.300 cm/s LVOT VTI:          0.133 m LVOT/AV VTI ratio: 0.65  AORTA Ao Root diam: 3.30 cm Ao Asc  diam:  2.90 cm MITRAL VALVE MV Area (PHT): 5.46 cm    SHUNTS MV Area VTI:   2.99 cm    Systemic VTI:  0.13 m MV Peak grad:  2.2 mmHg    Systemic Diam: 2.10 cm MV Mean grad:  1.0 mmHg MV Vmax:       0.75 m/s MV Vmean:      59.1 cm/s MV Decel Time: 139 msec MV E velocity: 86.40 cm/s MV A velocity: 71.10 cm/s MV E/A ratio:  1.22 Vinie Maxcy MD Electronically signed by Vinie Maxcy MD Signature Date/Time: 06/04/2023/1:37:10 PM    Final    DG Chest Port 1 View Result Date: 06/04/2023 CLINICAL DATA:  Chest tube in place EXAM: PORTABLE CHEST 1 VIEW COMPARISON:  Yesterday FINDINGS: Numerous leads and wires project over the chest. Minimal tracheal deviation to the right. Mild cardiomegaly. A left-sided pleural pigtail catheter is unchanged in position inferiorly. No significant change in moderate left pleural effusion with extensive loculation superiorly and laterally. Relatively diffuse left-sided airspace disease is also similar. The right lung remains clear. IMPRESSION: Relatively similar appearance of loculated left-sided pleural fluid and diffuse left-sided airspace disease. Left-sided pleural catheter remaining in place. Electronically Signed   By: Rockey Kilts M.D.   On: 06/04/2023 09:34   DG Chest Port 1 View Result Date: 06/03/2023 CLINICAL DATA:  Chest tube in place. EXAM: PORTABLE CHEST 1 VIEW COMPARISON:  Same day. FINDINGS: Stable cardiomediastinal silhouette. Right lung is clear. Interval placement of pleural drainage catheter in the left lung base. Left pleural effusion is significantly enlarged compared to prior exam with left upper lobe atelectasis or infiltrate. IMPRESSION: Interval placement of pleural drainage catheter into the left lung base. Left pleural effusion is significantly enlarged compared to prior exam with left upper lobe atelectasis or infiltrate. Electronically Signed   By: Lynwood Landy Raddle M.D.   On: 06/03/2023 16:59   DG Chest 2 View Result Date: 06/03/2023 CLINICAL DATA:  Cough  EXAM: CHEST - 2 VIEW COMPARISON:  04/10/2020 FINDINGS: Loculated left pleural effusion with left lower lobe  consolidation. Mild vascular congestion of the right lung. Normal cardiomediastinal contours. IMPRESSION: Loculated left pleural effusion with left lower lobe consolidation, concerning for pneumonia. Electronically Signed   By: Franky Stanford M.D.   On: 06/03/2023 02:20   CT ABDOMEN PELVIS W CONTRAST Result Date: 06/02/2023 CLINICAL DATA:  Left flank pain EXAM: CT ABDOMEN AND PELVIS WITH CONTRAST TECHNIQUE: Multidetector CT imaging of the abdomen and pelvis was performed using the standard protocol following bolus administration of intravenous contrast. RADIATION DOSE REDUCTION: This exam was performed according to the departmental dose-optimization program which includes automated exposure control, adjustment of the mA and/or kV according to patient size and/or use of iterative reconstruction technique. CONTRAST:  OMNIPAQUE  IOHEXOL  300 MG/ML  SOLN COMPARISON:  09/25/2018 FINDINGS: Lower Chest: Loculated left pleural effusion with left lower lobe consolidation. Small area of right middle lobe consolidation anteriorly. Hepatobiliary: Normal hepatic contours. No intra- or extrahepatic biliary dilatation. The gallbladder is normal. Pancreas: Normal pancreas. No ductal dilatation or peripancreatic fluid collection. Spleen: Normal. Adrenals/Urinary Tract: The adrenal glands are normal. No hydronephrosis, nephroureterolithiasis or solid renal mass. The urinary bladder is normal for degree of distention Stomach/Bowel: There is no hiatal hernia. Normal duodenal course and caliber. No small bowel dilatation or inflammation. No focal colonic abnormality. Normal appendix. Vascular/Lymphatic: Normal course and caliber of the major abdominal vessels. No abdominal or pelvic lymphadenopathy. Reproductive: Normal prostate size with symmetric seminal vesicles. Other: None. Musculoskeletal: No bony spinal canal stenosis  or focal osseous abnormality. IMPRESSION: 1. Loculated left pleural effusion with left lower lobe consolidation, concerning for post-obstructive pneumonia. 2. Small area of right middle lobe consolidation anteriorly, also concerning for pneumonia. 3. No acute abnormality of the abdomen or pelvis. Electronically Signed   By: Franky Stanford M.D.   On: 06/02/2023 23:53      Signature  -   Lavada Stank M.D on 06/11/2023 at 9:02 AM   -  To page go to [email or web address]

## 2023-06-11 NOTE — Progress Notes (Addendum)
 NAME:  Brent Bryant, MRN:  [id number], DOB:  25-Jul-1990, LOS: 8 ADMISSION DATE:  06/02/2023, CONSULTATION DATE:  06/03/23 REFERRING MD:  Raenelle, CHIEF COMPLAINT:  flank pain   History of Present Illness:  33 year old man with hx of HepC, substance abuse presenting with 3 days of cough and chest pain.   Cough with brown sputum.  Chest pain is pleuritic.  Workup has revealed complex L pleural space and dense pneumonia.  PCCM consulted for assistance with management.  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     Pertinent  Medical History  HepC  Significant Hospital Events: Including procedures, antibiotic start and stop dates in addition to other pertinent events   2/1 admit, small bored chest tube placed  2/2 First dose pleural lytics, out of chest tune since insertion 2/3 2nd dose of lytics. After intrapleural lytics patient had severe pain associated with tachycardia and tachypnea.  2/5 2nd CT placed posteriorly by IR 2/6 repeat dose of lytics in new chest tube. Patient complained of intense pain post procedure with tachycardia.  He does not want to get lytics again in the chest tube.  Interim History / Subjective:   No acute events overnight.  Minimal output from chest tubes 20 cc from upper CT, 0 cc from lower CT  Objective   Blood pressure 114/77, pulse 86, temperature 98.2 F (36.8 C), temperature source Oral, resp. rate 18, height 5' 8 (1.727 m), weight 59.9 kg, SpO2 99%.        Intake/Output Summary (Last 24 hours) at 06/11/2023 1144 Last data filed at 06/11/2023 0522 Gross per 24 hour  Intake --  Output 920 ml  Net -920 ml   Filed Weights   06/03/23 2243 06/06/23 1400  Weight: 59 kg 59.9 kg   Physical Exam: Blood pressure 114/77, pulse 86, temperature 98.2 F (36.8 C), temperature source Oral, resp. rate 18, height 5' 8 (1.727 m), weight 59.9 kg, SpO2 99%. Gen:      No acute distress, malnourished HEENT:  EOMI, sclera anicteric, poor dentition Neck:      No masses; no thyromegaly Lungs:    Clear to auscultation bilaterally; normal respiratory effort CV:         Regular rate and rhythm; no murmurs Abd:      + bowel sounds; soft, non-tender; no palpable masses, no distension Ext:    No edema; adequate peripheral perfusion Skin:      Warm and dry; no rash Neuro: alert and oriented x 3  CT chest reviewed with resolution of anterior loculated effusion.  Tiny pneumothorax in the upper chest.  With small residual effusion in the posterior left base.  Resolved Hospital Problem list   N/A  Assessment & Plan:  Empyema Sepsis Hx polysubstance abuse -S/p lytics 2/2 and 2/3.  -another CT tube posterior placed on 2/5 and lytics 2/6.  -He is refusing additional doses of pleural lytics due to pain. CT is improved  Plan: CT with resolution of anterior loculated component and minimal basal component. The chest tube output is minimal.  Can remove both of them today Continue antibiotics Follow-up chest x-ray  Best Practice (right click and Reselect all SmartList Selections daily)   Per primary  Critical care time: N/A   Ayshia Gramlich MD [facility] See Amion for pager  If no response to pager , please call [phone] until 7pm After 7:00 pm call Elink  [phone] 06/11/2023, 11:48 AM

## 2023-06-12 ENCOUNTER — Inpatient Hospital Stay (HOSPITAL_COMMUNITY): Payer: MEDICAID

## 2023-06-12 ENCOUNTER — Other Ambulatory Visit (HOSPITAL_COMMUNITY): Payer: Self-pay

## 2023-06-12 ENCOUNTER — Telehealth: Payer: Self-pay | Admitting: Pulmonary Disease

## 2023-06-12 DIAGNOSIS — B182 Chronic viral hepatitis C: Secondary | ICD-10-CM

## 2023-06-12 DIAGNOSIS — E43 Unspecified severe protein-calorie malnutrition: Secondary | ICD-10-CM

## 2023-06-12 LAB — CBC WITH DIFFERENTIAL/PLATELET
Abs Immature Granulocytes: 0.03 10*3/uL (ref 0.00–0.07)
Basophils Absolute: 0.1 10*3/uL (ref 0.0–0.1)
Basophils Relative: 1 %
Eosinophils Absolute: 0.1 10*3/uL (ref 0.0–0.5)
Eosinophils Relative: 2 %
HCT: 29.3 % — ABNORMAL LOW (ref 39.0–52.0)
Hemoglobin: 9.6 g/dL — ABNORMAL LOW (ref 13.0–17.0)
Immature Granulocytes: 0 %
Lymphocytes Relative: 29 %
Lymphs Abs: 2.5 10*3/uL (ref 0.7–4.0)
MCH: 30.7 pg (ref 26.0–34.0)
MCHC: 32.8 g/dL (ref 30.0–36.0)
MCV: 93.6 fL (ref 80.0–100.0)
Monocytes Absolute: 0.8 10*3/uL (ref 0.1–1.0)
Monocytes Relative: 10 %
Neutro Abs: 5.1 10*3/uL (ref 1.7–7.7)
Neutrophils Relative %: 58 %
Platelets: 866 10*3/uL — ABNORMAL HIGH (ref 150–400)
RBC: 3.13 MIL/uL — ABNORMAL LOW (ref 4.22–5.81)
RDW: 14 % (ref 11.5–15.5)
WBC: 8.6 10*3/uL (ref 4.0–10.5)
nRBC: 0 % (ref 0.0–0.2)

## 2023-06-12 LAB — COMPREHENSIVE METABOLIC PANEL
ALT: 25 U/L (ref 0–44)
AST: 34 U/L (ref 15–41)
Albumin: 1.8 g/dL — ABNORMAL LOW (ref 3.5–5.0)
Alkaline Phosphatase: 59 U/L (ref 38–126)
Anion gap: 16 — ABNORMAL HIGH (ref 5–15)
BUN: 5 mg/dL — ABNORMAL LOW (ref 6–20)
CO2: 26 mmol/L (ref 22–32)
Calcium: 9.1 mg/dL (ref 8.9–10.3)
Chloride: 94 mmol/L — ABNORMAL LOW (ref 98–111)
Creatinine, Ser: 0.6 mg/dL — ABNORMAL LOW (ref 0.61–1.24)
GFR, Estimated: >60 mL/min (ref >60)
Glucose, Bld: 92 mg/dL (ref 70–99)
Potassium: 3.9 mmol/L (ref 3.5–5.1)
Sodium: 136 mmol/L (ref 135–145)
Total Bilirubin: 0.3 mg/dL (ref 0.0–1.2)
Total Protein: 6.8 g/dL (ref 6.5–8.1)

## 2023-06-12 LAB — PHOSPHORUS: Phosphorus: 4.9 mg/dL — ABNORMAL HIGH (ref 2.5–4.6)

## 2023-06-12 LAB — C-REACTIVE PROTEIN: CRP: 3.9 mg/dL — ABNORMAL HIGH (ref <1.0)

## 2023-06-12 LAB — MAGNESIUM: Magnesium: 1.8 mg/dL (ref 1.7–2.4)

## 2023-06-12 LAB — PROCALCITONIN: Procalcitonin: 0.15 ng/mL

## 2023-06-12 LAB — BRAIN NATRIURETIC PEPTIDE: B Natriuretic Peptide: 10.2 pg/mL (ref 0.0–100.0)

## 2023-06-12 MED ORDER — AMOXICILLIN-POT CLAVULANATE 875-125 MG PO TABS
1.0000 | ORAL_TABLET | Freq: Two times a day (BID) | ORAL | Status: DC
  Administered 2023-06-12 (×2): 1 via ORAL
  Filled 2023-06-12 (×2): qty 1

## 2023-06-12 MED ORDER — AMOXICILLIN-POT CLAVULANATE 875-125 MG PO TABS
1.0000 | ORAL_TABLET | Freq: Two times a day (BID) | ORAL | 0 refills | Status: DC
  Filled 2023-06-12: qty 48, 24d supply, fill #0

## 2023-06-12 NOTE — TOC Progression Note (Signed)
 Transition of Care [facility]) - Progression Note    Patient Details  Name: Brent Bryant MRN: [id number] Date of Birth: 04/17/1991  [facility] CM/SW Contact  Jannine Meo, RN Phone Number: 06/12/2023, 2:32 PM  Clinical Narrative:    Chest tubes removed, patient with possible discharge 06/13/23. IV abx to be switched to PO per ID note.    Expected Discharge Plan: Home/Self Care Barriers to Discharge: Continued Medical Work up  Expected Discharge Plan and Services In-house Referral: Financial Counselor     Living arrangements for the past 2 months: Apartment                                       Social Determinants of Health (SDOH) Interventions SDOH Screenings   Food Insecurity: Patient Unable To Answer (06/04/2023)  Housing: Patient Unable To Answer (06/04/2023)  Transportation Needs: Patient Unable To Answer (06/04/2023)  Social Connections: Unknown (09/13/2021)   Received from [facility]  Tobacco Use: High Risk (06/07/2023)    Readmission Risk Interventions     No data to display

## 2023-06-12 NOTE — Progress Notes (Signed)
 NAME:  Brent Bryant, MRN:  [id number], DOB:  [birth year], LOS: 9 ADMISSION DATE:  06/02/2023, CONSULTATION DATE:  06/03/23 REFERRING MD:  Hilton Lucky, CHIEF COMPLAINT:  flank pain   History of Present Illness:  33 year old man with hx of HepC, substance abuse presenting with 3 days of cough and chest pain.   Cough with brown sputum.  Chest pain is pleuritic.  Workup has revealed complex L pleural space and dense pneumonia.  PCCM consulted for assistance with management.  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     Pertinent  Medical History  HepC  Significant Hospital Events: Including procedures, antibiotic start and stop dates in addition to other pertinent events   2/1 admit, small bored chest tube placed  2/2 First dose pleural lytics, out of chest tune since insertion 2/3 2nd dose of lytics. After intrapleural lytics patient had severe pain associated with tachycardia and tachypnea.  2/5 2nd CT placed posteriorly by IR 2/6 repeat dose of lytics in new chest tube. Patient complained of intense pain post procedure with tachycardia.  He does not want to get lytics again in the chest tube.  Interim History / Subjective:   No acute events overnight.  Minimal output from chest tubes 20 cc from upper CT, 0 cc from lower CT  Objective   Blood pressure 107/61, pulse 92, temperature 97.6 F (36.4 C), temperature source Oral, resp. rate 13, height 5\' 8"  (1.727 m), weight 59.9 kg, SpO2 95%.        Intake/Output Summary (Last 24 hours) at 06/12/2023 1602 Last data filed at 06/12/2023 0745 Gross per 24 hour  Intake 240 ml  Output 900 ml  Net -660 ml   Filed Weights   06/03/23 2243 06/06/23 1400  Weight: 59 kg 59.9 kg   Physical Exam: Blood pressure 114/77, pulse 86, temperature 98.2 F (36.8 C), temperature source Oral, resp. rate 18, height 5\' 8"  (1.727 m), weight 59.9 kg, SpO2 99%. Gen:      No acute distress, malnourished HEENT:  EOMI, sclera anicteric, poor  dentition Neck:     No masses; no thyromegaly Lungs:    Clear to auscultation bilaterally; normal respiratory effort CV:         Regular rate and rhythm; no murmurs Abd:      + bowel sounds; soft, non-tender; no palpable masses, no distension Ext:    No edema; adequate peripheral perfusion Skin:      Warm and dry; no rash Neuro: alert and oriented x 3  CT chest reviewed with resolution of anterior loculated effusion.  Tiny pneumothorax in the upper chest.  With small residual effusion in the posterior left base.  Resolved Hospital Problem list   N/A  Assessment & Plan:  Empyema Sepsis Hx polysubstance abuse -S/p lytics 2/2 and 2/3.  -another CT tube posterior placed on 2/5 and lytics 2/6.  -He is refusing additional doses of pleural lytics due to pain. CT is improved  Plan: CT with resolution of anterior loculated component and minimal basal component. The chest tube output was minimal and they were removed on 2/10 Follow-up chest x-ray reviewed Continue antibiotics.  Agree with Augmentin  for 4 weeks Will make follow-up in clinic for reassessment.  PCCM will sign off.  Please call with any questions.  Best Practice (right click and "Reselect all SmartList Selections" daily)   Per primary  Critical care time: N/A   Phyllis Breeze MD [facility] See Amion for pager  If no response to pager , please call [phone] until 7pm After 7:00 pm call Elink  [phone] 06/12/2023, 4:02 PM

## 2023-06-12 NOTE — Progress Notes (Signed)
 Occupational Therapy Treatment Patient Details Name: Brent Bryant MRN: [id number] DOB: 03/28/1991 Today's Date: 06/12/2023   History of present illness Pt is a 33 y/o male presenting on 1/31 with 3 days of cough and chest pain. Work up revealed L pleural space and dense pneumonia. 2/1 s/p small bored chest tube.  Chest CT with large loculated left effusion.2/5 IR for guided chest tube placement. PMH includes: hepC, substance abuse.   OT comments  Pt has met acute OT goals. Pt continues to be limited by L flank pain after removal of chest tubes and decreased knowledge of energy conservation techniques. Pt completed functional mobility Independently requiring verbal cues for pacing to conserve energy and increase pt safety and PLB to reduce SOB when experiencing. Pt verbalized understanding. Pt able to perform functional mobility throughout pt room without use of DME to gather clothing in preparation for dressing tasks. Pt completed ADLs independently on this date. Pt able to achieve figure-4 method to donn socks onto B feet and upper body dressing tasks while standing unsupported. After functional ambulation pt HR was 116 but decreased to 100 once lying in bed. Pt met acute OT goals. Recommend pt discharge home without follow-up OT services.      If plan is discharge home, recommend the following:  Assist for transportation;Assistance with cooking/housework   Equipment Recommendations  None recommended by OT    Recommendations for Other Services      Precautions / Restrictions Precautions Precautions: Fall Restrictions Weight Bearing Restrictions Per Provider Order: No       Mobility Bed Mobility Overal bed mobility: Independent             General bed mobility comments: completed without physical assistance and use of bed rails    Transfers Overall transfer level: Independent Equipment used: None Transfers: Sit to/from Stand, Bed to chair/wheelchair/BSC Sit to Stand:  Independent Stand pivot transfers: Independent         General transfer comment: Completed without physical assistance. Pt educated on pacing to conserve energy     Balance Overall balance assessment: Needs assistance Sitting-balance support: No upper extremity supported, Feet supported Sitting balance-Leahy Scale: Good Sitting balance - Comments: completed lower body dressing tasks while seated EOB   Standing balance support: No upper extremity supported, During functional activity Standing balance-Leahy Scale: Good Standing balance comment: completed upper body dressing tasks while standing unsupported                           ADL either performed or assessed with clinical judgement   ADL Overall ADL's : Needs assistance/impaired                 Upper Body Dressing : Cueing for safety;Independent   Lower Body Dressing: Cueing for safety;Independent   Toilet Transfer: Tour manager and Hygiene: Independent;Sit to/from stand       Functional mobility during ADLs: Independent General ADL Comments: Pt requires verbal cues for pacing to conserve energy and increase safety. Pt HR 116 after ambulation and decreased to 100 once lying in bed.    Extremity/Trunk Assessment Upper Extremity Assessment Upper Extremity Assessment: Overall WFL for tasks assessed   Lower Extremity Assessment Lower Extremity Assessment: Defer to PT evaluation        Vision   Vision Assessment?: No apparent visual deficits   Perception Perception Perception: Within Functional Limits   Praxis Praxis Praxis: Not tested    Cognition  Arousal: Alert Behavior During Therapy: WFL for tasks assessed/performed Overall Cognitive Status: Within Functional Limits for tasks assessed                                 General Comments: Answered questions appropriately        Exercises      Shoulder Instructions        General Comments VSS on room air    Pertinent Vitals/ Pain       Pain Assessment Pain Assessment: Faces Faces Pain Scale: Hurts a little bit Pain Location: L flank Pain Descriptors / Indicators: Discomfort Pain Intervention(s): Monitored during session, Premedicated before session  Home Living Family/patient expects to be discharged to:: Private residence                                        Prior Functioning/Environment              Frequency  Min 1X/week        Progress Toward Goals  OT Goals(current goals can now be found in the care plan section)  Progress towards OT goals: Goals met/education completed, patient discharged from OT  Acute Rehab OT Goals Patient Stated Goal: go home OT Goal Formulation: With patient Time For Goal Achievement: 06/21/23 Potential to Achieve Goals: Good ADL Goals Pt Will Perform Grooming: with modified independence;standing Pt Will Perform Lower Body Dressing: with modified independence;sit to/from stand;with adaptive equipment Pt Will Transfer to Toilet: with modified independence;ambulating;bedside commode Additional ADL Goal #1: Pt will utilize energy conservation techniques during ADL routine to optimize independence, safety and return to PLOF.  Plan      Co-evaluation                 AM-PAC OT "6 Clicks" Daily Activity     Outcome Measure   Help from another person eating meals?: None Help from another person taking care of personal grooming?: None Help from another person toileting, which includes using toliet, bedpan, or urinal?: None Help from another person bathing (including washing, rinsing, drying)?: None Help from another person to put on and taking off regular upper body clothing?: None Help from another person to put on and taking off regular lower body clothing?: None 6 Click Score: 24    End of Session Equipment Utilized During Treatment: Gait belt  OT Visit Diagnosis: Other  abnormalities of gait and mobility (R26.89);Pain Pain - Right/Left: Left Pain - part of body:  (flank)   Activity Tolerance Patient tolerated treatment well   Patient Left in bed;with call bell/phone within reach   Nurse Communication Mobility status        Time: 1610-9604 OT Time Calculation (min): 12 min  Charges: OT General Charges $OT Visit: 1 Visit OT Treatments $Self Care/Home Management : 8-22 mins  Iverson Market 06/12/2023, 3:28 PM

## 2023-06-12 NOTE — Plan of Care (Signed)
  Problem: Education: Goal: Knowledge of General Education information will improve Description: Including pain rating scale, medication(s)/side effects and non-pharmacologic comfort measures Outcome: Progressing   Problem: Health Behavior/Discharge Planning: Goal: Ability to manage health-related needs will improve Outcome: Progressing   Problem: Clinical Measurements: Goal: Ability to maintain clinical measurements within normal limits will improve Outcome: Progressing   Problem: Activity: Goal: Risk for activity intolerance will decrease Outcome: Progressing   Problem: Nutrition: Goal: Adequate nutrition will be maintained Outcome: Progressing   Problem: Pain Managment: Goal: General experience of comfort will improve and/or be controlled Outcome: Progressing   Problem: Safety: Goal: Ability to remain free from injury will improve Outcome: Progressing   Problem: Skin Integrity: Goal: Risk for impaired skin integrity will decrease Outcome: Progressing

## 2023-06-12 NOTE — Progress Notes (Signed)
 [facility]    Date of Admission:  06/02/2023   Total days of antibiotics 11   ID: Brent Bryant is a 33 y.o. 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 Chest tube removed but has small PTX stlil patchy infiltrate. Cx showing rare strep group F. Has poor dentition as possible source Principal Problem:   CAP (community acquired pneumonia) Active Problems:   Polysubstance abuse (HCC)   Severe sepsis (HCC)   Empyema lung (HCC)   Protein-calorie malnutrition, severe   Empyema (HCC)   Pleural effusion    Subjective: Afebrile, still some mild pleuritic pain on deep inhalation on left side. Pain improved since removal of chest tube  Medications:   amoxicillin -clavulanate  1 tablet Oral Q12H   bisacodyl   10 mg Rectal Once   feeding supplement  237 mL Oral BID BM   guaiFENesin   600 mg Oral BID   heparin  injection (subcutaneous)  5,000 Units Subcutaneous Q8H   lidocaine   2 patch Transdermal Q24H   multivitamin with minerals  1 tablet Oral Daily   polyethylene glycol  17 g Oral BID   senna-docusate  2 tablet Oral QHS   sodium chloride  flush  10 mL Intrapleural Q8H    Objective: Vital signs in last 24 hours: Temp:  [97.6 F (36.4 C)-98.9 F (37.2 C)] 97.6 F (36.4 C) (02/10 1252) Pulse Rate:  [78-92] 92 (02/10 1252) Resp:  [13-18] 13 (02/10 1252) BP: (96-113)/(60-71) 107/61 (02/10 1252) SpO2:  [93 %-95 %] 95 % (02/10 1252)  Physical Exam  Constitutional: He is oriented to person, place, and time. He appears cachetic. undernourished No distress.  HENT:  Mouth/Throat: Oropharynx is clear and moist. No oropharyngeal exudate.  Cardiovascular: Normal rate, regular rhythm and normal heart sounds. Exam reveals no gallop and no friction rub.  No murmur heard.  Pulmonary/Chest: Effort normal and breath sounds normal. No  respiratory distress. He has no wheezes. Decrease breath sounds left mid lung base Abdominal: Soft. Bowel sounds are normal. He exhibits no distension. There is no tenderness.  Lymphadenopathy:  He has no cervical adenopathy.  Neurological: He is alert and oriented to person, place, and time.  Skin: Skin is warm and dry. No rash noted. No erythema.  Psychiatric: He has a normal mood and affect. His behavior is normal.    Lab Results Recent Labs    06/10/23 0524 06/11/23 0626 06/12/23 0429  WBC 11.4* 10.4 8.6  HGB 9.9* 9.6* 9.6*  HCT 30.0* 29.1* 29.3*  NA 134*  --  136  K 4.5  --  3.9  CL 93*  --  94*  CO2 24  --  26  BUN 6  --  5*  CREATININE 0.58*  --  0.60*   Liver Panel Recent Labs    06/12/23 0429  PROT 6.8  ALBUMIN 1.8*  AST 34  ALT 25  ALKPHOS 59  BILITOT 0.3   Sedimentation Rate No results for input(s): "ESRSEDRATE" in the last 72 hours. C-Reactive Protein Recent Labs    06/11/23 0626 06/12/23 0429  CRP 5.9* 3.9*    Microbiology: Strep species/group F on 2/1 Studies/Results: DG CHEST PORT 1 VIEW Result Date: 06/12/2023 CLINICAL DATA:  Follow-up pleural effusion EXAM: PORTABLE CHEST 1 VIEW COMPARISON:  06/11/2023 FINDINGS: Cardiac shadow is stable. Aortic calcifications are seen. Lungs are well aerated bilaterally. Elevation of  left hemidiaphragm is again seen. No recurrent pneumothorax is noted. Persistent atelectasis is noted in the left base. IMPRESSION: No recurrent pneumothorax seen. No change from the prior exam. Electronically Signed   By: Violeta Grey M.D.   On: 06/12/2023 11:14   DG CHEST PORT 1 VIEW Result Date: 06/11/2023 CLINICAL DATA:  Status post chest tube removal.  Pleural effusion. EXAM: PORTABLE CHEST 1 VIEW COMPARISON:  Chest CT dated 06/11/2023. FINDINGS: Interval removal of the left-sided chest tubes. There is mild eventration of the left hemidiaphragm. Left lung streaky densities noted. No large pleural effusion. No pneumothorax. Right  lung is clear. The cardiac silhouette is within normal limits. No acute osseous pathology. IMPRESSION: Interval removal of the left-sided chest tubes. No pneumothorax. Electronically Signed   By: Angus Bark M.D.   On: 06/11/2023 21:16   CT CHEST WO CONTRAST Result Date: 06/11/2023 CLINICAL DATA:  Empyema follow-up. EXAM: CT CHEST WITHOUT CONTRAST TECHNIQUE: Multidetector CT imaging of the chest was performed following the standard protocol without IV contrast. RADIATION DOSE REDUCTION: This exam was performed according to the departmental dose-optimization program which includes automated exposure control, adjustment of the mA and/or kV according to patient size and/or use of iterative reconstruction technique. COMPARISON:  The last chest CT was chest CT without contrast 06/06/2023. Comparison is also made with subsequent portable chest films from February 6-8, 2025. FINDINGS: Cardiovascular: The cardiac size is normal. There is minimal pericardial fluid, unchanged. The aorta and great vessels are normal in caliber. The pulmonary arteries and veins are normal in caliber. There are no visible calcifications in the aorta and coronary arteries. Mediastinum/Nodes: There appears to be a few mildly prominent left hilar lymph nodes, but this is difficult to evaluate without contrast. No new contour deforming abnormality is seen. There is probably no further intrathoracic adenopathy but subject contrast is further limited by very little body fat. Lower poles of the thyroid gland, axillary spaces, thoracic esophagus are unremarkable. The trachea and both main bronchi are clear. Lungs/Pleura: A pigtail chest tube continues to be present in the medial left chest base entering from anterolaterally then curving posteriorly abutting the mediastinum next to the distal esophagus. The more recently placed chest tube enters through the posterolateral left seventh intercostal space extending 1 intercostal level superiorly with  the pigtail superimposing medially to the sixth intercostal space. There is a very small sliver of a pneumothorax persisting anteriorly in the upper to mid chest on 4:63, much smaller than previously noted with resolution of the prior hydro component of this collection. There is probably no change in overall aeration over recent chest films but certainly dramatic improvement since February 4. Most of the large volume of pleural fluid on February 4 is now gone with a small residual loculated pleural fluid collection in the posterior left chest base extending up the paraspinal area medially to the level of the carina. There is patchy airspace disease in the left lower lobe along with irregular cavitation and volume loss to the superior segment. In the lingular base there is coarse linear atelectasis or scarring. There is a small consolidation anteromedially in the right upper lobe which has improved. Rest of the lungs are clear. Layering right pleural fluid on the prior study has also resolved. Upper Abdomen: No acute abnormality. Musculoskeletal: No chest wall mass or suspicious bone lesions identified. IMPRESSION: 1. There is a very small sliver of a pneumothorax persisting anteriorly in the upper to mid chest, much smaller than previously noted with resolution  of the prior hydro component of this collection. 2. Most of the large volume of pleural fluid on February 4 is now gone with a small residual loculated pleural fluid collection in the posterior left chest base extending up the paraspinal area medially to the level of the carina. 3. Patchy airspace disease in the left lower lobe along with irregular cavitation and volume loss to the superior segment. 4. Small consolidation anteromedially in the right upper lobe which has improved. 5. Minimal pericardial fluid, unchanged. 6. Mildly prominent left hilar lymph nodes, difficult to evaluate without contrast. Electronically Signed   By: Denman Fischer M.D.   On:  06/11/2023 07:23     Assessment/Plan: Complicated pna with empyema s/p chest tube drainage = still will need to switch from unasyn  to amox/clav 875mg  bid. Plan for 4 wks through march 5th, we will have him follow up with ID to see if abtx need to be continued. Will check sed rate and crp. And plan for repeat cxr at follow up appt  Leukocytosis = improving  Severe protein calorie malnutrition = continue to supplement intake with protein/nutritional shake, encouraged 3 meals with 2 snacks to exceed 200kcal per day  Chronic hep C without hepatic coma = will check genotype to address treatment after he finishes treatment for pneumonia  Poor dentition = likely source of pneumonia. Recommend to follow up with dentistry for possible extraction.  evaluation of this patient requires complex antimicrobial therapy evaluation and counseling and isolation needs for disease transmission risk assessment and mitigation.    [facility] Pager: [phone]  06/12/2023, 1:01 PM

## 2023-06-12 NOTE — Progress Notes (Signed)
 PROGRESS NOTE        PATIENT DETAILS Name: Brent Bryant Age: 33 y.o. Sex: male Date of Birth: Sep 29, 1990 Admit Date: 06/02/2023 Admitting Physician Jetty Mort, MD [id number], Arnetta Lank, [name]  Brief Summary: Patient is a 33 y.o.  male with history polysubstance abuse, chronic HCV-who presented with severe left-sided chest pain/flank pain-found to have sepsis secondary to left lower lobe pneumonia with empyema  Significant events: 2/01>> admit to [facility] consult-chest tube placed (pleural fluid-LDH 1174, WBC 7700) 2/2>> intrapleural lytics 2/3>> intrapleural lytics 2/4>> CT chest: Worsening left pleural effusion 2/5>> second chest tube placed by IR 2/6>> intrapleural lytics 2/7>> refused intrapleural lytics due to worsening pain.  Significant studies: 1/31>> CT abdomen/pelvis: Loculated pleural effusion-hide left lower PNA. 2/02>> TTE: EF 65-70% 2/04>> CT chest: Worsening loculated left pleural effusion despite placement of pigtail 2/04>> CT maxillofacial: Advanced dental/periodontal disease. 2/9 >>CT - 1. There is a very small sliver of a pneumothorax persisting anteriorly in the upper to mid chest, much smaller than previously noted with resolution of the prior hydro component of this collection. 2. Most of the large volume of pleural fluid on February 4 is now gone with a small residual loculated pleural fluid collection in the posterior left chest base extending up the paraspinal area medially to the level of the carina. 3. Patchy airspace disease in the left lower lobe along with irregular cavitation and volume loss to the superior segment. 4. Small consolidation anteromedially in the right upper lobe which has improved. 5. Minimal pericardial fluid, unchanged. 6. Mildly prominent left hilar lymph nodes, difficult to evaluate without contrast. 06/11/2023.  Both left-sided chest tubes removed by PCCM.  Significant microbiology data: 1/31>>  COVID/influenza/RSV PCR: Negative 2/01>> pleural fluid culture: No growth 2/01>> sputum culture: rare strep group F 2/01>> blood culture: No growth 2/04>> blood culture: No growth  Procedures: 2/01>> chest tube placement by PCCM 2/05>> second chest tube placement by IR  Consults: PCCM ID  Subjective:  Patient in bed, appears comfortable, denies any headache, no fever, no chest pain or pressure, no shortness of breath , no abdominal pain. No new focal weakness.    Objective: Vitals: Blood pressure 96/60, pulse 89, temperature 98.1 F (36.7 C), temperature source Oral, resp. rate 17, height 5\' 8"  (1.727 m), weight 59.9 kg, SpO2 94%.   Exam:  Awake Alert, No new F.N deficits, Normal affect [place].AT,PERRAL Supple Neck, No JVD,   Symmetrical Chest wall movement, Good air movement bilaterally, CTAB,   RRR,No Gallops, Rubs or new Murmurs,  +ve B.Sounds, Abd Soft, No tenderness,   No Cyanosis, Clubbing or edema   Assessment/Plan:  Sepsis secondary to left lower lobe PNA with empyema-s/p chest tube placement by PCCM on 2/1 Sepsis physiology continues to improve First chest tube placed by PCCM on 2/1-received lytics on 2/2 and 2/3.  Had another chest tube placed on 06/07/2023, both chest tubes removed on 06/11/2023. Remains on Unasyn , per ID after left-sided chest tubes are removed he could be transition to oral Augmentin  stop date 07/05/2023. PCCM has ordered another CT chest to reassess-whether chest tube can be removed. ID/PCCM following  Dental caries/poor oral hygiene Outpatient dentistry follow-up-no inpatient dentist coverage at [facility]  Chronic HCV Outpatient treatment if patient follows up in the ID clinic.  Normocytic anemia Secondary to acute illness Follow CBC  Hyponatremia Mild Asymptomatic Repeat electrolytes tomorrow  History  of polysubstance abuse-UDS positive for opiates/cocaine/THC Per patient-he has been clean for more than 6 months--suspects that his UDS is  positive as he was exposed to cocaine that his brother was using in his apartment.  Underweight: Cachectic/emaciated-will get nutrition evaluation HIV negative TSH stable  Constipation Finally had a BM on 2/7 Continue MiraLAX /senna.    Nutrition Status: Nutrition Problem: Severe Malnutrition Etiology: social / environmental circumstances Signs/Symptoms: severe muscle depletion, severe fat depletion Interventions: MVI, Hormel Shake    Estimated body mass index is 20.08 kg/m as calculated from the following:   Height as of this encounter: 5\' 8"  (1.727 m).   Weight as of this encounter: 59.9 kg.   Code status:   Code Status: Full Code   DVT Prophylaxis: heparin  injection 5,000 Units Start: 06/11/23 1400 Place and maintain sequential compression device Start: 06/05/23 2016, add Heparin  06/11/23   Family Communication: None at bedside   Disposition Plan: Status is: Inpatient Remains inpatient appropriate because:    Planned Discharge Destination:Home   Diet: Diet Order             Diet regular Fluid consistency: Thin  Diet effective now                    MEDICATIONS: Scheduled Meds:  bisacodyl   10 mg Rectal Once   feeding supplement  237 mL Oral BID BM   guaiFENesin   600 mg Oral BID   heparin  injection (subcutaneous)  5,000 Units Subcutaneous Q8H   lidocaine   2 patch Transdermal Q24H   multivitamin with minerals  1 tablet Oral Daily   polyethylene glycol  17 g Oral BID   senna-docusate  2 tablet Oral QHS   sodium chloride  flush  10 mL Intrapleural Q8H   Continuous Infusions:  ampicillin -sulbactam (UNASYN ) IV 3 g (06/12/23 0346)   PRN Meds:.acetaminophen  **OR** acetaminophen , bisacodyl , levalbuterol , LORazepam , metoprolol  tartrate, morphine  injection, ondansetron  **OR** ondansetron  (ZOFRAN ) IV, oxyCODONE , sodium phosphate   I have personally reviewed following labs and imaging studies  LABORATORY DATA:   Data Review:    Recent Labs  Lab  06/07/23 0445 06/08/23 0437 06/09/23 0546 06/10/23 0524 06/11/23 0626 06/12/23 0429  WBC 17.6* 16.0* 14.1* 11.4* 10.4 8.6  HGB 9.6* 10.4* 9.7* 9.9* 9.6* 9.6*  HCT 28.2* 31.1* 28.9* 30.0* 29.1* 29.3*  PLT 513* 734* 831* 899* 949* 866*  MCV 91.0 91.5 91.7 92.9 93.9 93.6  MCH 31.0 30.6 30.8 30.7 31.0 30.7  MCHC 34.0 33.4 33.6 33.0 33.0 32.8  RDW 14.6 14.3 14.1 13.9 13.9 14.0  LYMPHSABS 2.6  --   --   --  1.6 2.5  MONOABS 1.5*  --   --   --  0.5 0.8  EOSABS 0.1  --   --   --  0.2 0.1  BASOSABS 0.1  --   --   --  0.0 0.1    Recent Labs  Lab 06/06/23 0435 06/07/23 0445 06/08/23 0437 06/09/23 0546 06/10/23 0524 06/11/23 0626 06/12/23 0429  NA 131* 128* 130* 129* 134*  --  136  K 4.4 4.1 4.3 4.6 4.5  --  3.9  CL 102 97* 97* 95* 93*  --  94*  CO2 22 24 26 26 24   --  26  ANIONGAP 7 7 7 8  17*  --  16*  GLUCOSE 97 111* 90 94 102*  --  92  BUN 12 6 <5* 5* 6  --  5*  CREATININE 0.70 0.47* 0.51* 0.63 0.58*  --  0.60*  AST 17 18  --   --   --   --  34  ALT 11 11  --   --   --   --  25  ALKPHOS 74 72  --   --   --   --  59  BILITOT 0.5 0.3  --   --   --   --  0.3  ALBUMIN <1.5* <1.5*  --   --   --   --  1.8*  CRP  --   --   --   --   --  5.9* 3.9*  PROCALCITON  --   --   --   --   --  0.23 0.15  TSH  --  4.249  --   --   --   --   --   BNP  --   --   --   --   --  13.6 10.2  MG  --  1.6* 1.9  --   --  1.8 1.8  PHOS  --  3.8 4.3  --   --  4.8* 4.9*  CALCIUM 7.1* 7.4* 7.5* 8.0* 9.0  --  9.1      Recent Labs  Lab 06/07/23 0445 06/08/23 0437 06/09/23 0546 06/10/23 0524 06/11/23 0626 06/12/23 0429  CRP  --   --   --   --  5.9* 3.9*  PROCALCITON  --   --   --   --  0.23 0.15  TSH 4.249  --   --   --   --   --   BNP  --   --   --   --  13.6 10.2  MG 1.6* 1.9  --   --  1.8 1.8  CALCIUM 7.4* 7.5* 8.0* 9.0  --  9.1     Micro Results Recent Results (from the past 240 hours)  Resp panel by RT-PCR (RSV, Flu A&B, Covid) Anterior Nasal Swab     Status: None   Collection Time:  06/02/23  6:16 PM   Specimen: Anterior Nasal Swab  Result Value Ref Range Status   SARS Coronavirus 2 by RT PCR NEGATIVE NEGATIVE Final    Comment: (NOTE) SARS-CoV-2 target nucleic acids are NOT DETECTED.  The SARS-CoV-2 RNA is generally detectable in upper respiratory specimens during the acute phase of infection. The lowest concentration of SARS-CoV-2 viral copies this assay can detect is 138 copies/mL. A negative result does not preclude SARS-Cov-2 infection and should not be used as the sole basis for treatment or other patient management decisions. A negative result may occur with  improper specimen collection/handling, submission of specimen other than nasopharyngeal swab, presence of viral mutation(s) within the areas targeted by this assay, and inadequate number of viral copies(<138 copies/mL). A negative result must be combined with clinical observations, patient history, and epidemiological information. The expected result is Negative.  Fact Sheet for Patients:  BloggerCourse.com  Fact Sheet for Healthcare Providers:  SeriousBroker.it  This test is no t yet approved or cleared by the [place]  FDA and  has been authorized for detection and/or diagnosis of SARS-CoV-2 by FDA under an Emergency Use Authorization (EUA). This EUA will remain  in effect (meaning this test can be used) for the duration of the COVID-19 declaration under Section 564(b)(1) of the Act, 21 U.S.C.section 360bbb-3(b)(1), unless the authorization is terminated  or revoked sooner.       Influenza A by PCR NEGATIVE NEGATIVE Final  Influenza B by PCR NEGATIVE NEGATIVE Final    Comment: (NOTE) The Xpert Xpress SARS-CoV-2/FLU/RSV plus assay is intended as an aid in the diagnosis of influenza from Nasopharyngeal swab specimens and should not be used as a sole basis for treatment. Nasal washings and aspirates are unacceptable for Xpert Xpress  SARS-CoV-2/FLU/RSV testing.  Fact Sheet for Patients: BloggerCourse.com  Fact Sheet for Healthcare Providers: SeriousBroker.it  This test is not yet approved or cleared by the [place]  FDA and has been authorized for detection and/or diagnosis of SARS-CoV-2 by FDA under an Emergency Use Authorization (EUA). This EUA will remain in effect (meaning this test can be used) for the duration of the COVID-19 declaration under Section 564(b)(1) of the Act, 21 U.S.C. section 360bbb-3(b)(1), unless the authorization is terminated or revoked.     Resp Syncytial Virus by PCR NEGATIVE NEGATIVE Final    Comment: (NOTE) Fact Sheet for Patients: BloggerCourse.com  Fact Sheet for Healthcare Providers: SeriousBroker.it  This test is not yet approved or cleared by the [place]  FDA and has been authorized for detection and/or diagnosis of SARS-CoV-2 by FDA under an Emergency Use Authorization (EUA). This EUA will remain in effect (meaning this test can be used) for the duration of the COVID-19 declaration under Section 564(b)(1) of the Act, 21 U.S.C. section 360bbb-3(b)(1), unless the authorization is terminated or revoked.  Performed at [facility], [address] [place], [place] [address]   Culture, blood (Routine X 2) w Reflex to ID Panel     Status: None   Collection Time: 06/03/23  1:30 AM   Specimen: BLOOD LEFT FOREARM  Result Value Ref Range Status   Specimen Description   Final    BLOOD LEFT FOREARM Performed at [facility], [address] [place], [place] [address]    Special Requests   Final    BOTTLES DRAWN AEROBIC AND ANAEROBIC Blood Culture results may not be optimal due to an inadequate volume of blood received in culture bottles Performed at [facility], [address], [place], [place] [address]     Culture   Final    NO GROWTH 5 DAYS Performed at [facility], [address] [place], [place] [address]    Report Status 06/08/2023 FINAL  Final  Culture, blood (Routine X 2) w Reflex to ID Panel     Status: None   Collection Time: 06/03/23  1:39 AM   Specimen: BLOOD LEFT HAND  Result Value Ref Range Status   Specimen Description   Final    BLOOD LEFT HAND Performed at [facility], [address], [place], [place] [address]    Special Requests   Final    BOTTLES DRAWN AEROBIC AND ANAEROBIC Blood Culture adequate volume Performed at [facility], [address], [place], [place] [address]    Culture   Final    NO GROWTH 5 DAYS Performed at [facility], [address] [place], [place] [address]    Report Status 06/08/2023 FINAL  Final  Expectorated Sputum Assessment w Gram Stain, Rflx to Resp Cult     Status: None   Collection Time: 06/03/23  4:48 AM   Specimen: Sputum  Result Value Ref Range Status   Specimen Description   Final    SPUTUM Performed at [facility], [address], [place], [place] [address]    Special Requests   Final    Normal Performed at [facility], [phone], [place], [place] [address]    Sputum evaluation   Final    THIS SPECIMEN IS ACCEPTABLE FOR SPUTUM CULTURE Performed at [facility], [address], [place], [place] [address]    Report Status 06/03/2023 FINAL  Final  Culture, Respiratory w Gram Stain     Status: None   Collection Time: 06/03/23  4:48 AM   Specimen: SPU  Result Value Ref Range Status   Specimen Description SPUTUM  Final   Special Requests Normal Reflexed from [id number]  Final   Gram Stain   Final    MODERATE WBC PRESENT,BOTH PMN AND MONONUCLEAR FEW GRAM POSITIVE COCCI IN PAIRS FEW GRAM NEGATIVE RODS RARE YEAST WITH PSEUDOHYPHAE    Culture   Final    RARE STREPTOCOCCUS GROUP F Beta hemolytic streptococci are  predictably susceptible to penicillin and other beta lactams. Susceptibility testing not routinely performed. WITHIN NORMAL RESPIRATORY FLORA Performed at [facility], [address], [place], [place] [address]    Report Status 06/06/2023 FINAL  Final  Body fluid culture w Gram Stain     Status: None   Collection Time: 06/03/23  4:35 PM   Specimen: Pleural Fluid  Result Value Ref Range Status   Specimen Description FLUID PLEURAL  Final   Special Requests NONE  Final   Gram Stain   Final    FEW WBC PRESENT,BOTH PMN AND MONONUCLEAR NO ORGANISMS SEEN    Culture   Final    NO GROWTH 3 DAYS Performed at [facility], [address], [place], [place] [address]    Report Status 06/07/2023 FINAL  Final  Fungus Culture With Stain     Status: None (Preliminary result)   Collection Time: 06/03/23  4:35 PM   Specimen: Pleural, Left; Pleural Fluid  Result Value Ref Range Status   Fungus Stain Final report  Final    Comment: (NOTE) Performed At: [facility] [address] [place], [place] [id number] Pearlean Botts MD [id number]    Fungus (Mycology) Culture PENDING  Incomplete   Fungal Source FLUID  Final    Comment: PLEURAL Performed at [facility], [address] [place], [place] [address]   Fungus Culture Result     Status: None   Collection Time: 06/03/23  4:35 PM  Result Value Ref Range Status   Result 1 Comment  Final    Comment: (NOTE) KOH/Calcofluor preparation:  no fungus observed. Performed At: [facility] [address] [place], [place] [id number] Pearlean Botts MD [id number]   MRSA Next Gen by PCR, Nasal     Status: None   Collection Time: 06/04/23 12:20 AM   Specimen: Nasal Mucosa; Nasal Swab  Result Value Ref Range Status   MRSA by PCR Next Gen NOT DETECTED NOT DETECTED Final    Comment: (NOTE) The GeneXpert MRSA Assay (FDA approved for NASAL specimens only), is one component of a comprehensive MRSA colonization  surveillance program. It is not intended to diagnose MRSA infection nor to guide or monitor treatment for MRSA infections. Test performance is not FDA approved in patients less than 52 years old. Performed at [facility], [address], [place], [place] [address]   Culture, blood (Routine X 2) w Reflex to ID Panel     Status: None   Collection Time: 06/06/23  7:46 AM   Specimen: BLOOD RIGHT HAND  Result Value Ref Range Status   Specimen Description BLOOD RIGHT HAND  Final   Special  Requests   Final    BOTTLES DRAWN AEROBIC AND ANAEROBIC Blood Culture results may not be optimal due to an inadequate volume of blood received in culture bottles   Culture   Final    NO GROWTH 5 DAYS Performed at [facility], [address] [place], [place] [address]    Report Status 06/11/2023 FINAL  Final  Culture, blood (Routine X 2) w Reflex to ID Panel     Status: None   Collection Time: 06/06/23  7:46 AM   Specimen: BLOOD LEFT ARM  Result Value Ref Range Status   Specimen Description BLOOD LEFT ARM  Final   Special Requests   Final    BOTTLES DRAWN AEROBIC AND ANAEROBIC Blood Culture results may not be optimal due to an inadequate volume of blood received in culture bottles   Culture   Final    NO GROWTH 5 DAYS Performed at [facility], [address] [place], [place] [address]    Report Status 06/11/2023 FINAL  Final    Radiology Reports DG CHEST PORT 1 VIEW Result Date: 06/11/2023 CLINICAL DATA:  Status post chest tube removal.  Pleural effusion. EXAM: PORTABLE CHEST 1 VIEW COMPARISON:  Chest CT dated 06/11/2023. FINDINGS: Interval removal of the left-sided chest tubes. There is mild eventration of the left hemidiaphragm. Left lung streaky densities noted. No large pleural effusion. No pneumothorax. Right lung is clear. The cardiac silhouette is within normal limits. No acute osseous pathology. IMPRESSION: Interval removal of the left-sided chest tubes. No pneumothorax.  Electronically Signed   By: Angus Bark M.D.   On: 06/11/2023 21:16   CT CHEST WO CONTRAST Result Date: 06/11/2023 CLINICAL DATA:  Empyema follow-up. EXAM: CT CHEST WITHOUT CONTRAST TECHNIQUE: Multidetector CT imaging of the chest was performed following the standard protocol without IV contrast. RADIATION DOSE REDUCTION: This exam was performed according to the departmental dose-optimization program which includes automated exposure control, adjustment of the mA and/or kV according to patient size and/or use of iterative reconstruction technique. COMPARISON:  The last chest CT was chest CT without contrast 06/06/2023. Comparison is also made with subsequent portable chest films from February 6-8, 2025. FINDINGS: Cardiovascular: The cardiac size is normal. There is minimal pericardial fluid, unchanged. The aorta and great vessels are normal in caliber. The pulmonary arteries and veins are normal in caliber. There are no visible calcifications in the aorta and coronary arteries. Mediastinum/Nodes: There appears to be a few mildly prominent left hilar lymph nodes, but this is difficult to evaluate without contrast. No new contour deforming abnormality is seen. There is probably no further intrathoracic adenopathy but subject contrast is further limited by very little body fat. Lower poles of the thyroid gland, axillary spaces, thoracic esophagus are unremarkable. The trachea and both main bronchi are clear. Lungs/Pleura: A pigtail chest tube continues to be present in the medial left chest base entering from anterolaterally then curving posteriorly abutting the mediastinum next to the distal esophagus. The more recently placed chest tube enters through the posterolateral left seventh intercostal space extending 1 intercostal level superiorly with the pigtail superimposing medially to the sixth intercostal space. There is a very small sliver of a pneumothorax persisting anteriorly in the upper to mid chest on  4:63, much smaller than previously noted with resolution of the prior hydro component of this collection. There is probably no change in overall aeration over recent chest films but certainly dramatic improvement since February 4. Most of the large volume  of pleural fluid on February 4 is now gone with a small residual loculated pleural fluid collection in the posterior left chest base extending up the paraspinal area medially to the level of the carina. There is patchy airspace disease in the left lower lobe along with irregular cavitation and volume loss to the superior segment. In the lingular base there is coarse linear atelectasis or scarring. There is a small consolidation anteromedially in the right upper lobe which has improved. Rest of the lungs are clear. Layering right pleural fluid on the prior study has also resolved. Upper Abdomen: No acute abnormality. Musculoskeletal: No chest wall mass or suspicious bone lesions identified. IMPRESSION: 1. There is a very small sliver of a pneumothorax persisting anteriorly in the upper to mid chest, much smaller than previously noted with resolution of the prior hydro component of this collection. 2. Most of the large volume of pleural fluid on February 4 is now gone with a small residual loculated pleural fluid collection in the posterior left chest base extending up the paraspinal area medially to the level of the carina. 3. Patchy airspace disease in the left lower lobe along with irregular cavitation and volume loss to the superior segment. 4. Small consolidation anteromedially in the right upper lobe which has improved. 5. Minimal pericardial fluid, unchanged. 6. Mildly prominent left hilar lymph nodes, difficult to evaluate without contrast. Electronically Signed   By: Denman Fischer M.D.   On: 06/11/2023 07:23   DG CHEST PORT 1 VIEW Result Date: 06/10/2023 CLINICAL DATA:  5626 Acute respiratory failure (HCC) 5626 [id number] Pleural effusion [id number] EXAM:  PORTABLE CHEST 1 VIEW COMPARISON:  Chest radiograph from one day prior. FINDINGS: Two pigtail lower left chest tubes are stable. Stable cardiomediastinal silhouette with normal heart size. No pneumothorax. Stable mild blunting of the left costophrenic angle. No significant right pleural effusion. Similar mild volume loss in the left hemithorax with mild streaky left parahilar and left basilar opacities. IMPRESSION: 1. Stable left chest tubes. No pneumothorax. 2. Stable mild blunting of the left costophrenic angle, either scarring or residual small left pleural effusion. 3. Similar mild volume loss in the left hemithorax with mild streaky left parahilar and left basilar opacities. Electronically Signed   By: Levell Reach M.D.   On: 06/10/2023 10:52   DG CHEST PORT 1 VIEW Result Date: 06/09/2023 CLINICAL DATA:  Evaluate for recurrent pleural effusion EXAM: PORTABLE CHEST 1 VIEW COMPARISON:  06/08/2023 FINDINGS: Cardiac shadow is stable. Two pigtail catheters are again noted on the left. No pneumothorax is noted. Minimal blunting of the costophrenic angle is noted. No significant increase in pleural effusion is noted. Persistent left basilar atelectasis is noted. IMPRESSION: No significant interval change from the prior exam. No significant increase in pleural effusion is noted. Electronically Signed   By: Violeta Grey M.D.   On: 06/09/2023 10:11   DG Chest Port 1 View Result Date: 06/08/2023 CLINICAL DATA:  Empyema. EXAM: PORTABLE CHEST 1 VIEW COMPARISON:  06/06/2023. FINDINGS: Redemonstration of heterogeneous opacification of the left hemithorax, overall decreased in density since the prior study, suggesting interval decrease in the loculated left pleural effusion, likely status post additional left pleural drainage placement. No pneumothorax. There also superimposed areas of atelectasis and or pneumonia. Please refer to recent chest CT scan for additional details. There are 2 left-sided pleural drainage  catheters. Right lung is grossly clear. No dense consolidation or lung collapse. Right lateral costophrenic angle is clear. No pneumothorax. Stable cardio-mediastinal silhouette. No acute osseous  abnormalities. The soft tissues are within normal limits. IMPRESSION: *Interval decrease in the loculated left pleural effusion, status post left-sided additional pleural drainage catheter placement. In total, there are now 2 left-sided pleural drainage catheters. No pneumothorax seen. Electronically Signed   By: Beula Brunswick M.D.   On: 06/08/2023 15:53   CT [facility] PLEURAL DRAIN W/INDWELL CATH W/IMG GUIDE Result Date: 06/07/2023 INDICATION: 33 year old male with history of complex left empyema. EXAM: CT PERC PLEURAL DRAIN W/INDWELL CATH W/IMG GUIDE COMPARISON:  None Available. MEDICATIONS: The patient is currently admitted to the hospital and receiving intravenous antibiotics. The antibiotics were administered within an appropriate time frame prior to the initiation of the procedure. ANESTHESIA/SEDATION: Moderate (conscious) sedation was employed during this procedure. A total of Versed  1 mg and Fentanyl  50 mcg was administered intravenously. Moderate Sedation Time: 10 minutes. The patient's level of consciousness and vital signs were monitored continuously by radiology nursing throughout the procedure under my direct supervision. CONTRAST:  None COMPLICATIONS: None immediate. PROCEDURE: RADIATION DOSE REDUCTION: This exam was performed according to the departmental dose-optimization program which includes automated exposure control, adjustment of the mA and/or kV according to patient size and/or use of iterative reconstruction technique. Informed written consent was obtained from the patient after a discussion of the risks, benefits and alternatives to treatment. The patient was placed supine, partially right lateral decubitus on the CT gantry and a pre procedural CT was performed re-demonstrating the known fluid  collection within the left pleural space. The procedure was planned. A timeout was performed prior to the initiation of the procedure. The left posterolateral and inferior thorax was prepped and draped in the usual sterile fashion. The overlying soft tissues were anesthetized with 1% lidocaine  with epinephrine . Appropriate trajectory was planned with the use of a 22 gauge spinal needle. An 18 gauge trocar needle was advanced into the pleural space and a short Amplatz super stiff wire was coiled within the collection. Appropriate positioning was confirmed with a limited CT scan. The tract was serially dilated allowing placement of a 14 French all-purpose drainage catheter. Appropriate positioning was confirmed with a limited postprocedural CT scan. The tube was connected to a pleurovac and sutured in place. A dressing was placed. The patient tolerated the procedure well without immediate post procedural complication. IMPRESSION: Successful CT guided placement of a 78 French all purpose drain catheter into the posterolateral and inferior left pleural space with aspiration of serous fluid. Creasie Doctor, MD [facility] Electronically Signed   By: Creasie Doctor M.D.   On: 06/07/2023 16:53   CT MAXILLOFACIAL W CONTRAST Result Date: 06/06/2023 CLINICAL DATA:  Dental caries EXAM: CT MAXILLOFACIAL WITH CONTRAST TECHNIQUE: Multidetector CT imaging of the maxillofacial structures was performed with intravenous contrast. Multiplanar CT image reconstructions were also generated. RADIATION DOSE REDUCTION: This exam was performed according to the departmental dose-optimization program which includes automated exposure control, adjustment of the mA and/or kV according to patient size and/or use of iterative reconstruction technique. CONTRAST:  75mL OMNIPAQUE  IOHEXOL  350 MG/ML SOLN COMPARISON:  None Available. FINDINGS: Osseous: No traumatic facial bone finding. Dental:  Unerupted right maxillary tooth 1. Multiple caries evident affecting the anterior maxillary dentition from tooth 4 through tooth 12. Lucency around the roots particularly at teeth 8, 9 and 10. With respect to the mandibular dentition, there is advanced decay with multiple caries. Unfortunately there is a good bit of motion degradation. Pronounced lucency adjacent to the roots of the left mandibular dentition with a  confluent root abscess in the right mandible affecting teeth 30 and 31. Orbits: Normal Sinuses: Clear Soft tissues: Negative Limited intracranial: Normal IMPRESSION: 1. Advanced dental and periodontal disease as outlined above. Electronically Signed   By: Bettylou Brunner M.D.   On: 06/06/2023 22:39   CT CHEST WO CONTRAST Result Date: 06/06/2023 CLINICAL DATA:  Pneumonia complication EXAM: CT CHEST WITHOUT CONTRAST TECHNIQUE: Multidetector CT imaging of the chest was performed following the standard protocol without IV contrast. RADIATION DOSE REDUCTION: This exam was performed according to the departmental dose-optimization program which includes automated exposure control, adjustment of the mA and/or kV according to patient size and/or use of iterative reconstruction technique. COMPARISON:  CT abdomen 06/02/2023 FINDINGS: Cardiovascular: Heart size normal. Blood pool is hypodense compared to the interventricular septum suggesting anemia. Trace pericardial fluid. Central great vessels unremarkable. Mediastinum/Nodes: Anterior mediastinal soft tissue density presumably residual thymic tissue. No definite mass or adenopathy, sensitivity decreased without contrast. Lungs/Pleura: Progressive moderate partially loculated left pleural effusion despite placement of pigtail chest tube anteromedially at the lung base. Scattered loculated pleural gas bubbles may be related to chest tube placement versus infection. Persistent dense consolidation of the left lower lobe. Worsening dependent consolidation/atelectasis in  the lingula and left upper lobe. Small right pleural effusion has developed. Consolidation with air bronchograms posteriorly in the right lower lobe. Subsegmental consolidation/atelectasis anteromedially in the right upper lobe. Upper Abdomen: No acute findings. Musculoskeletal: No chest wall mass or suspicious bone lesions identified. IMPRESSION: 1. Progressive moderate partially loculated left pleural effusion despite placement of pigtail chest tube. 2. Worsening dependent consolidation/atelectasis in the lingula and left upper lobe. 3. Persistent dense consolidation of the left lower lobe. 4. New small right pleural effusion. 5. New consolidation/atelectasis posteriorly in the right lower lobe. Electronically Signed   By: Nicoletta Barrier M.D.   On: 06/06/2023 16:52   DG Chest Port 1V same Day Result Date: 06/06/2023 CLINICAL DATA:  141880 SOB (shortness of breath) [id number] EXAM: PORTABLE CHEST 1 VIEW COMPARISON:  Chest x-ray 06/05/2023, CT abdomen pelvis 06/02/2023 FINDINGS: Left chest tube with pigtail overlying the lower medial left hemithorax. The heart and mediastinal contours are within normal limits. Left lung airspace opacity. No pulmonary edema. Interval increase in size of a loculated moderate to large volume left pleural effusion. No right pleural effusion. No pneumothorax. No acute osseous abnormality. IMPRESSION: 1. Interval increase in size of a loculated moderate to large volume left pleural effusion. 2. Underlying left lung airspace opacity may represent combination of infection/inflammation and atelectasis. 3. Left chest tube stable. Electronically Signed   By: Morgane  Naveau M.D.   On: 06/06/2023 07:59   DG CHEST PORT 1 VIEW Result Date: 06/05/2023 CLINICAL DATA:  Pain EXAM: PORTABLE CHEST 1 VIEW COMPARISON:  Chest x-ray 06/05/2023 FINDINGS: Left-sided chest tube is unchanged in position. Left pleural effusion which appears partially loculated is unchanged from prior. There is no pneumothorax.  There is a new band of opacity in the right suprahilar region. Patchy opacities in the left lower lung have increased. Cardiomediastinal silhouette is within normal limits. No acute fractures are seen. IMPRESSION: 1. Left-sided chest tube is unchanged in position. 2. Stable partially loculated left pleural effusion. 3. New band of opacity in the right suprahilar region. 4. Increasing left basilar air atelectasis/airspace disease. Electronically Signed   By: Tyron Gallon M.D.   On: 06/05/2023 18:30   DG CHEST PORT 1 VIEW Result Date: 06/05/2023 CLINICAL DATA:  Chest tube in place. EXAM: PORTABLE CHEST 1 VIEW  COMPARISON:  Radiographs 06/04/2023 and 06/03/2023. Abdominal CT 06/02/2023. FINDINGS: 0527 hours. Left pigtail chest tube is unchanged in position, overlying the medial left lung base. Lobulated left pleural effusion appears mildly decreased in volume, and there is improved aeration of the left lung. The right lung is clear. No evidence of pneumothorax. The visualized heart size and mediastinal contours are stable. IMPRESSION: Mild decrease in volume of left pleural effusion with improved aeration of the left lung. No evidence of pneumothorax. Electronically Signed   By: Elmon Hagedorn M.D.   On: 06/05/2023 10:12   ECHOCARDIOGRAM COMPLETE Result Date: 06/04/2023    ECHOCARDIOGRAM REPORT   Patient Name:   Brent Bryant Date of Exam: 06/04/2023 Medical Rec #:  [id number]    Height:       68.0 in Accession #:    [id number]   Weight:       130.1 lb Date of Birth:  01-01-1991    BSA:          1.702 m Patient Age:    32 years     BP:           119/74 mmHg Patient Gender: M            HR:           111 bpm. Exam Location:  Inpatient Procedure: 2D Echo, Cardiac Doppler and Color Doppler Indications:    Endocarditis  History:        Patient has no prior history of Echocardiogram examinations.  Sonographer:    Amy Chionchio Referring Phys: Josiah Nigh IMPRESSIONS  1. Left ventricular ejection fraction, by estimation,  is 65 to 70%. Left ventricular ejection fraction by 2D MOD biplane is 68.5 %. The left ventricle has normal function. The left ventricle has no regional wall motion abnormalities. Left ventricular diastolic parameters were normal.  2. Right ventricular systolic function is normal. The right ventricular size is normal. Tricuspid regurgitation signal is inadequate for assessing PA pressure.  3. The mitral valve is grossly normal. No evidence of mitral valve regurgitation.  4. The aortic valve was not well visualized. Aortic valve regurgitation is not visualized.  5. The inferior vena cava is normal in size with <50% respiratory variability, suggesting right atrial pressure of 8 mmHg. Comparison(s): No prior Echocardiogram. Conclusion(s)/Recommendation(s): No evidence of valvular vegetations on this transthoracic echocardiogram. Consider a transesophageal echocardiogram to exclude infective endocarditis if clinically indicated. FINDINGS  Left Ventricle: Left ventricular ejection fraction, by estimation, is 65 to 70%. Left ventricular ejection fraction by 2D MOD biplane is 68.5 %. The left ventricle has normal function. The left ventricle has no regional wall motion abnormalities. The left ventricular internal cavity size was normal in size. There is no left ventricular hypertrophy. Left ventricular diastolic parameters were normal. Right Ventricle: The right ventricular size is normal. No increase in right ventricular wall thickness. Right ventricular systolic function is normal. Tricuspid regurgitation signal is inadequate for assessing PA pressure. Left Atrium: Left atrial size was normal in size. Right Atrium: Right atrial size was normal in size. Pericardium: There is no evidence of pericardial effusion. Mitral Valve: The mitral valve is grossly normal. No evidence of mitral valve regurgitation. MV peak gradient, 2.2 mmHg. The mean mitral valve gradient is 1.0 mmHg. Tricuspid Valve: The tricuspid valve is normal in  structure. Tricuspid valve regurgitation is not demonstrated. Aortic Valve: The aortic valve was not well visualized. Aortic valve regurgitation is not visualized. Aortic valve mean gradient measures 5.0 mmHg. Aortic valve  peak gradient measures 10.8 mmHg. Aortic valve area, by VTI measures 2.26 cm. Pulmonic Valve: The pulmonic valve was normal in structure. Pulmonic valve regurgitation is not visualized. Aorta: The aortic root and ascending aorta are structurally normal, with no evidence of dilitation. Venous: The inferior vena cava is normal in size with less than 50% respiratory variability, suggesting right atrial pressure of 8 mmHg. IAS/Shunts: No atrial level shunt detected by color flow Doppler.  LEFT VENTRICLE PLAX 2D                        Biplane EF (MOD) LVIDd:         4.60 cm         LV Biplane EF:   Left LVIDs:         3.60 cm                          ventricular LV PW:         0.80 cm                          ejection LV IVS:        0.60 cm                          fraction by LVOT diam:     2.10 cm                          2D MOD LV SV:         46                               biplane is LV SV Index:   27                               68.5 %. LVOT Area:     3.46 cm                                Diastology                                LV e' medial:    11.20 cm/s LV Volumes (MOD)               LV E/e' medial:  7.7 LV vol d, MOD    101.0 ml      LV e' lateral:   17.00 cm/s A2C:                           LV E/e' lateral: 5.1 LV vol d, MOD    126.0 ml A4C: LV vol s, MOD    25.3 ml A2C: LV vol s, MOD    50.4 ml A4C: LV SV MOD A2C:   75.7 ml LV SV MOD A4C:   126.0 ml LV SV MOD BP:    87.9 ml RIGHT VENTRICLE          IVC RV Basal diam:  2.70 cm  IVC diam: 1.50 cm TAPSE (M-mode): 2.3 cm LEFT ATRIUM  Index        RIGHT ATRIUM           Index LA Vol (A2C):   50.3 ml 29.55 ml/m  RA Area:     14.30 cm LA Vol (A4C):   44.7 ml 26.26 ml/m  RA Volume:   36.70 ml  21.56 ml/m LA Biplane Vol: 47.9 ml  28.14 ml/m  AORTIC VALVE                     PULMONIC VALVE AV Area (Vmax):    2.08 cm      PV Vmax:       0.96 m/s AV Area (Vmean):   2.31 cm      PV Peak grad:  3.6 mmHg AV Area (VTI):     2.26 cm AV Vmax:           164.00 cm/s AV Vmean:          107.000 cm/s AV VTI:            0.204 m AV Peak Grad:      10.8 mmHg AV Mean Grad:      5.0 mmHg LVOT Vmax:         98.40 cm/s LVOT Vmean:        71.300 cm/s LVOT VTI:          0.133 m LVOT/AV VTI ratio: 0.65  AORTA Ao Root diam: 3.30 cm Ao Asc diam:  2.90 cm MITRAL VALVE MV Area (PHT): 5.46 cm    SHUNTS MV Area VTI:   2.99 cm    Systemic VTI:  0.13 m MV Peak grad:  2.2 mmHg    Systemic Diam: 2.10 cm MV Mean grad:  1.0 mmHg MV Vmax:       0.75 m/s MV Vmean:      59.1 cm/s MV Decel Time: 139 msec MV E velocity: 86.40 cm/s MV A velocity: 71.10 cm/s MV E/A ratio:  1.22 Dinah Franco MD Electronically signed by Dinah Franco MD Signature Date/Time: 06/04/2023/1:37:10 PM    Final    DG Chest Port 1 View Result Date: 06/04/2023 CLINICAL DATA:  Chest tube in place EXAM: PORTABLE CHEST 1 VIEW COMPARISON:  Yesterday FINDINGS: Numerous leads and wires project over the chest. Minimal tracheal deviation to the right. Mild cardiomegaly. A left-sided pleural pigtail catheter is unchanged in position inferiorly. No significant change in moderate left pleural effusion with extensive loculation superiorly and laterally. Relatively diffuse left-sided airspace disease is also similar. The right lung remains clear. IMPRESSION: Relatively similar appearance of loculated left-sided pleural fluid and diffuse left-sided airspace disease. Left-sided pleural catheter remaining in place. Electronically Signed   By: Lore Rode M.D.   On: 06/04/2023 09:34   DG Chest Port 1 View Result Date: 06/03/2023 CLINICAL DATA:  Chest tube in place. EXAM: PORTABLE CHEST 1 VIEW COMPARISON:  Same day. FINDINGS: Stable cardiomediastinal silhouette. Right lung is clear. Interval placement of pleural  drainage catheter in the left lung base. Left pleural effusion is significantly enlarged compared to prior exam with left upper lobe atelectasis or infiltrate. IMPRESSION: Interval placement of pleural drainage catheter into the left lung base. Left pleural effusion is significantly enlarged compared to prior exam with left upper lobe atelectasis or infiltrate. Electronically Signed   By: Rosalene Colon M.D.   On: 06/03/2023 16:59   DG Chest 2 View Result Date: 06/03/2023 CLINICAL DATA:  Cough EXAM: CHEST - 2 VIEW COMPARISON:  04/10/2020 FINDINGS:  Loculated left pleural effusion with left lower lobe consolidation. Mild vascular congestion of the right lung. Normal cardiomediastinal contours. IMPRESSION: Loculated left pleural effusion with left lower lobe consolidation, concerning for pneumonia. Electronically Signed   By: Juanetta Nordmann M.D.   On: 06/03/2023 02:20   CT ABDOMEN PELVIS W CONTRAST Result Date: 06/02/2023 CLINICAL DATA:  Left flank pain EXAM: CT ABDOMEN AND PELVIS WITH CONTRAST TECHNIQUE: Multidetector CT imaging of the abdomen and pelvis was performed using the standard protocol following bolus administration of intravenous contrast. RADIATION DOSE REDUCTION: This exam was performed according to the departmental dose-optimization program which includes automated exposure control, adjustment of the mA and/or kV according to patient size and/or use of iterative reconstruction technique. CONTRAST:  OMNIPAQUE  IOHEXOL  300 MG/ML  SOLN COMPARISON:  09/25/2018 FINDINGS: Lower Chest: Loculated left pleural effusion with left lower lobe consolidation. Small area of right middle lobe consolidation anteriorly. Hepatobiliary: Normal hepatic contours. No intra- or extrahepatic biliary dilatation. The gallbladder is normal. Pancreas: Normal pancreas. No ductal dilatation or peripancreatic fluid collection. Spleen: Normal. Adrenals/Urinary Tract: The adrenal glands are normal. No hydronephrosis,  nephroureterolithiasis or solid renal mass. The urinary bladder is normal for degree of distention Stomach/Bowel: There is no hiatal hernia. Normal duodenal course and caliber. No small bowel dilatation or inflammation. No focal colonic abnormality. Normal appendix. Vascular/Lymphatic: Normal course and caliber of the major abdominal vessels. No abdominal or pelvic lymphadenopathy. Reproductive: Normal prostate size with symmetric seminal vesicles. Other: None. Musculoskeletal: No bony spinal canal stenosis or focal osseous abnormality. IMPRESSION: 1. Loculated left pleural effusion with left lower lobe consolidation, concerning for post-obstructive pneumonia. 2. Small area of right middle lobe consolidation anteriorly, also concerning for pneumonia. 3. No acute abnormality of the abdomen or pelvis. Electronically Signed   By: Juanetta Nordmann M.D.   On: 06/02/2023 23:53      Signature  -   Lynnwood Sauer M.D on 06/12/2023 at 9:43 AM   -  To page go to [email or web address]

## 2023-06-12 NOTE — Telephone Encounter (Signed)
 Please make follow up in 4-8 weeks with for hospital follow up with APP or any doctor

## 2023-06-13 ENCOUNTER — Other Ambulatory Visit (HOSPITAL_COMMUNITY): Payer: Self-pay

## 2023-06-13 MED ORDER — NAPROXEN 500 MG PO TABS
500.0000 mg | ORAL_TABLET | Freq: Two times a day (BID) | ORAL | 0 refills | Status: AC | PRN
  Filled 2023-06-13: qty 20, 10d supply, fill #0

## 2023-06-13 MED ORDER — ACETAMINOPHEN 500 MG PO TABS
500.0000 mg | ORAL_TABLET | Freq: Three times a day (TID) | ORAL | 0 refills | Status: AC | PRN
  Filled 2023-06-13: qty 30, 10d supply, fill #0

## 2023-06-13 NOTE — Plan of Care (Signed)
  Problem: Education: Goal: Knowledge of General Education information will improve Description: Including pain rating scale, medication(s)/side effects and non-pharmacologic comfort measures Outcome: Adequate for Discharge   Problem: Health Behavior/Discharge Planning: Goal: Ability to manage health-related needs will improve Outcome: Adequate for Discharge   Problem: Clinical Measurements: Goal: Ability to maintain clinical measurements within normal limits will improve Outcome: Adequate for Discharge Goal: Will remain free from infection Outcome: Adequate for Discharge Goal: Diagnostic test results will improve Outcome: Adequate for Discharge Goal: Respiratory complications will improve Outcome: Adequate for Discharge Goal: Cardiovascular complication will be avoided Outcome: Adequate for Discharge   Problem: Activity: Goal: Risk for activity intolerance will decrease Outcome: Adequate for Discharge   Problem: Nutrition: Goal: Adequate nutrition will be maintained Outcome: Adequate for Discharge   Problem: Coping: Goal: Level of anxiety will decrease Outcome: Adequate for Discharge   Problem: Elimination: Goal: Will not experience complications related to bowel motility Outcome: Adequate for Discharge Goal: Will not experience complications related to urinary retention Outcome: Adequate for Discharge   Problem: Pain Managment: Goal: General experience of comfort will improve and/or be controlled Outcome: Adequate for Discharge   Problem: Safety: Goal: Ability to remain free from injury will improve Outcome: Adequate for Discharge   Problem: Skin Integrity: Goal: Risk for impaired skin integrity will decrease Outcome: Adequate for Discharge   Problem: Activity: Goal: Ability to tolerate increased activity will improve Outcome: Adequate for Discharge   Problem: Respiratory: Goal: Ability to maintain adequate ventilation will improve Outcome: Adequate for  Discharge Goal: Ability to maintain a clear airway will improve Outcome: Adequate for Discharge

## 2023-06-13 NOTE — Discharge Summary (Addendum)
 Brent Bryant OZH:[id number] DOB: 1990/05/05 DOA: 06/02/2023  PCP: Dartha Lodge, [name]  Admit date: 06/02/2023  Discharge date: 06/13/2023  Admitted From: Home   Disposition:  Home   Recommendations for Outpatient Follow-up:   Follow up with PCP in 1-2 weeks  PCP Please obtain BMP/CBC, 2 view CXR in 1week,  (see Discharge instructions)   PCP Please follow up on the following pending results:    Home Health: None   Equipment/Devices: None  Consultations: PCCM, IR, ID Discharge Condition: Stable     CODE STATUS: Full     Diet Recommendation: Heart Healthy      Chief Complaint  Patient presents with   Flank Pain     Brief history of present illness from the day of admission and additional interim summary    33 y.o.  male with history polysubstance abuse, chronic HCV-who presented with severe left-sided chest pain/flank pain-found to have sepsis secondary to left lower lobe pneumonia with empyema   Significant events: 2/01>> admit to [facility] consult-chest tube placed (pleural fluid-LDH 1174, WBC 7700) 2/2>> intrapleural lytics 2/3>> intrapleural lytics 2/4>> CT chest: Worsening left pleural effusion 2/5>> second chest tube placed by IR 2/6>> intrapleural lytics 2/7>> refused intrapleural lytics due to worsening pain.   Significant studies: 1/31>> CT abdomen/pelvis: Loculated pleural effusion-hide left lower PNA. 2/02>> TTE: EF 65-70% 2/04>> CT chest: Worsening loculated left pleural effusion despite placement of pigtail 2/04>> CT maxillofacial: Advanced dental/periodontal disease. 2/9 >>CT - 1. There is a very small sliver of a pneumothorax persisting anteriorly in the upper to mid chest, much smaller than previously noted with resolution of the prior hydro component of this collection. 2. Most of the  large volume of pleural fluid on February 4 is now gone with a small residual loculated pleural fluid collection in the posterior left chest base extending up the paraspinal area medially to the level of the carina. 3. Patchy airspace disease in the left lower lobe along with irregular cavitation and volume loss to the superior segment. 4. Small consolidation anteromedially in the right upper lobe which has improved. 5. Minimal pericardial fluid, unchanged. 6. Mildly prominent left hilar lymph nodes, difficult to evaluate without contrast. 06/11/2023.  Both left-sided chest tubes removed by PCCM.   Significant microbiology data: 1/31>> COVID/influenza/RSV PCR: Negative 2/01>> pleural fluid culture: No growth 2/01>> sputum culture: rare strep group F 2/01>> blood culture: No growth 2/04>> blood culture: No growth   Procedures: 2/01>> chest tube placement by PCCM 2/05>> second chest tube placement by IR                                                                   Hospital Course   Sepsis secondary to left lower lobe PNA with empyema-s/p chest tube placement by PCCM on 2/1 Sepsis  physiology continues to improve First chest tube placed by PCCM on 2/1-received lytics on 2/2 and 2/3.  Had another chest tube placed on 06/07/2023, both chest tubes removed on 06/11/2023. He was seen by pulmonary critical care, IR and ID, case discussed with ID on 06/13/2023, he has been placed on oral Augmentin stop date 07/05/2023.  Here he was treated with IV Unasyn.     Dental caries/poor oral hygiene Outpatient dentistry follow-up-no inpatient dentist coverage at [facility].   Chronic HCV Outpatient treatment if patient follows up in the ID clinic.   Normocytic anemia Secondary to acute illness Follow CBC   Hyponatremia Improved after hydration   History of polysubstance abuse-UDS positive for opiates/cocaine/THC Per patient-he has been clean for more than 6 months--suspects that his UDS is positive as he was  exposed to cocaine that his brother was using in his apartment.   Underweight: Severe PCM Cachectic/emaciated-will get nutrition evaluation HIV negative TSH stable, requested to consume high-calorie and high-protein diet PCP to monitor   Constipation resolved after bowel regimen        Discharge diagnosis     Principal Problem:   CAP (community acquired pneumonia) Active Problems:   Polysubstance abuse (HCC)   Severe sepsis (HCC)   Empyema lung (HCC)   Protein-calorie malnutrition, severe   Empyema (HCC)   Pleural effusion   Chronic hepatitis C without hepatic coma (HCC)    Discharge instructions    Discharge Instructions     Diet - low sodium heart healthy   Complete by: As directed    Discharge instructions   Complete by: As directed    Follow with Primary MD Dartha Lodge, [name] in 7 days   Get CBC, CMP, 2 view Chest X ray -  checked next visit with your primary MD    Activity: As tolerated with Full fall precautions use walker/cane & assistance as needed  Disposition Home    Diet: Heart Healthy    Special Instructions: If you have smoked or chewed Tobacco  in the last 2 yrs please stop smoking, stop any regular Alcohol  and or any Recreational drug use.  On your next visit with your primary care physician please Get Medicines reviewed and adjusted.  Please request your Prim.MD to go over all Hospital Tests and Procedure/Radiological results at the follow up, please get all Hospital records sent to your Prim MD by signing hospital release before you go home.  If you experience worsening of your admission symptoms, develop shortness of breath, life threatening emergency, suicidal or homicidal thoughts you must seek medical attention immediately by calling 911 or calling your MD immediately  if symptoms less severe.  You Must read complete instructions/literature along with all the possible adverse reactions/side effects for all the Medicines you take and that  have been prescribed to you. Take any new Medicines after you have completely understood and accpet all the possible adverse reactions/side effects.   Do not drive when taking Pain medications.  Do not take more than prescribed Pain, Sleep and Anxiety Medications  Wear Seat belts while driving.   Please note  You were cared for by a hospitalist during your hospital stay. If you have any questions about your discharge medications or the care you received while you were in the hospital after you are discharged, you can call the unit and asked to speak with the hospitalist on call if the hospitalist that took care of you is not available. Once you are discharged, your primary  care physician will handle any further medical issues. Please note that NO REFILLS for any discharge medications will be authorized once you are discharged, as it is imperative that you return to your primary care physician (or establish a relationship with a primary care physician if you do not have one) for your aftercare needs so that they can reassess your need for medications and monitor your lab values.   Discharge wound care:   Complete by: As directed    Kindly keep your chest tube site under bandage clean and dry, do dry dressing change every other day.   Increase activity slowly   Complete by: As directed        Discharge Medications   Allergies as of 06/13/2023       Reactions   Tylenol [acetaminophen] Rash   Vicodin [hydrocodone-acetaminophen] Rash        Medication List     TAKE these medications    acetaminophen 500 MG tablet Commonly known as: TYLENOL Take 1 tablet (500 mg total) by mouth every 8 (eight) hours as needed for mild pain (pain score 1-3). What changed:  how much to take when to take this   amoxicillin-clavulanate 875-125 MG tablet Commonly known as: AUGMENTIN Take 1 tablet by mouth 2 (two) times daily for 24 days.   naproxen 500 MG tablet Commonly known as: Naprosyn Take 1  tablet (500 mg total) by mouth 2 (two) times daily as needed for moderate pain (pain score 4-6). As needed for pain What changed:  when to take this reasons to take this               Discharge Care Instructions  (From admission, onward)           Start     Ordered   06/13/23 0000  Discharge wound care:       Comments: Kindly keep your chest tube site under bandage clean and dry, do dry dressing change every other day.   06/13/23 1191             Follow-up Information     Dartha Lodge, [name]. Schedule an appointment as soon as possible for a visit in 1 week(s).   Specialty: Nurse Practitioner Contact information: [address] [place] [address] [phone]         Judyann Munson, MD. Schedule an appointment as soon as possible for a visit in 1 week(s).   Specialty: Infectious Diseases Contact information: [address] [place] [address] [phone]         [facility] at [facility]. Schedule an appointment as soon as possible for a visit in 1 week(s).   Specialty: Pulmonology Contact information: [address] [place] [address] [phone]                Major procedures and Radiology Reports - PLEASE review detailed and final reports thoroughly  -       DG CHEST PORT 1 VIEW Result Date: 06/12/2023 CLINICAL DATA:  Follow-up pleural effusion EXAM: PORTABLE CHEST 1 VIEW COMPARISON:  06/11/2023 FINDINGS: Cardiac shadow is stable. Aortic calcifications are seen. Lungs are well aerated bilaterally. Elevation of left hemidiaphragm is again seen. No recurrent pneumothorax is noted. Persistent atelectasis is noted in the left base. IMPRESSION: No recurrent pneumothorax seen. No change from the prior exam. Electronically Signed   By: Alcide Clever M.D.   On: 06/12/2023 11:14  DG CHEST PORT 1 VIEW Result Date: 06/11/2023 CLINICAL DATA:  Status post chest  tube removal.  Pleural effusion. EXAM: PORTABLE CHEST 1 VIEW COMPARISON:  Chest CT dated 06/11/2023. FINDINGS: Interval removal of the left-sided chest tubes. There is mild eventration of the left hemidiaphragm. Left lung streaky densities noted. No large pleural effusion. No pneumothorax. Right lung is clear. The cardiac silhouette is within normal limits. No acute osseous pathology. IMPRESSION: Interval removal of the left-sided chest tubes. No pneumothorax. Electronically Signed   By: Elgie Collard M.D.   On: 06/11/2023 21:16   CT CHEST WO CONTRAST Result Date: 06/11/2023 CLINICAL DATA:  Empyema follow-up. EXAM: CT CHEST WITHOUT CONTRAST TECHNIQUE: Multidetector CT imaging of the chest was performed following the standard protocol without IV contrast. RADIATION DOSE REDUCTION: This exam was performed according to the departmental dose-optimization program which includes automated exposure control, adjustment of the mA and/or kV according to patient size and/or use of iterative reconstruction technique. COMPARISON:  The last chest CT was chest CT without contrast 06/06/2023. Comparison is also made with subsequent portable chest films from February 6-8, 2025. FINDINGS: Cardiovascular: The cardiac size is normal. There is minimal pericardial fluid, unchanged. The aorta and great vessels are normal in caliber. The pulmonary arteries and veins are normal in caliber. There are no visible calcifications in the aorta and coronary arteries. Mediastinum/Nodes: There appears to be a few mildly prominent left hilar lymph nodes, but this is difficult to evaluate without contrast. No new contour deforming abnormality is seen. There is probably no further intrathoracic adenopathy but subject contrast is further limited by very little body fat. Lower poles of the thyroid gland, axillary spaces, thoracic esophagus are unremarkable. The trachea and both main bronchi are clear. Lungs/Pleura: A pigtail chest tube continues to  be present in the medial left chest base entering from anterolaterally then curving posteriorly abutting the mediastinum next to the distal esophagus. The more recently placed chest tube enters through the posterolateral left seventh intercostal space extending 1 intercostal level superiorly with the pigtail superimposing medially to the sixth intercostal space. There is a very small sliver of a pneumothorax persisting anteriorly in the upper to mid chest on 4:63, much smaller than previously noted with resolution of the prior hydro component of this collection. There is probably no change in overall aeration over recent chest films but certainly dramatic improvement since February 4. Most of the large volume of pleural fluid on February 4 is now gone with a small residual loculated pleural fluid collection in the posterior left chest base extending up the paraspinal area medially to the level of the carina. There is patchy airspace disease in the left lower lobe along with irregular cavitation and volume loss to the superior segment. In the lingular base there is coarse linear atelectasis or scarring. There is a small consolidation anteromedially in the right upper lobe which has improved. Rest of the lungs are clear. Layering right pleural fluid on the prior study has also resolved. Upper Abdomen: No acute abnormality. Musculoskeletal: No chest wall mass or suspicious bone lesions identified. IMPRESSION: 1. There is a very small sliver of a pneumothorax persisting anteriorly in the upper to mid chest, much smaller than previously noted with resolution of the prior hydro component of this collection. 2. Most of the large volume of pleural fluid on February 4 is now gone with a small residual loculated pleural fluid collection in the posterior left chest base extending up the paraspinal area medially to the  level of the carina. 3. Patchy airspace disease in the left lower lobe along with irregular cavitation and  volume loss to the superior segment. 4. Small consolidation anteromedially in the right upper lobe which has improved. 5. Minimal pericardial fluid, unchanged. 6. Mildly prominent left hilar lymph nodes, difficult to evaluate without contrast. Electronically Signed   By: Almira Bar M.D.   On: 06/11/2023 07:23   DG CHEST PORT 1 VIEW Result Date: 06/10/2023 CLINICAL DATA:  5626 Acute respiratory failure (HCC) 5626 142230 Pleural effusion 142230 EXAM: PORTABLE CHEST 1 VIEW COMPARISON:  Chest radiograph from one day prior. FINDINGS: Two pigtail lower left chest tubes are stable. Stable cardiomediastinal silhouette with normal heart size. No pneumothorax. Stable mild blunting of the left costophrenic angle. No significant right pleural effusion. Similar mild volume loss in the left hemithorax with mild streaky left parahilar and left basilar opacities. IMPRESSION: 1. Stable left chest tubes. No pneumothorax. 2. Stable mild blunting of the left costophrenic angle, either scarring or residual small left pleural effusion. 3. Similar mild volume loss in the left hemithorax with mild streaky left parahilar and left basilar opacities. Electronically Signed   By: Delbert Phenix M.D.   On: 06/10/2023 10:52   DG CHEST PORT 1 VIEW Result Date: 06/09/2023 CLINICAL DATA:  Evaluate for recurrent pleural effusion EXAM: PORTABLE CHEST 1 VIEW COMPARISON:  06/08/2023 FINDINGS: Cardiac shadow is stable. Two pigtail catheters are again noted on the left. No pneumothorax is noted. Minimal blunting of the costophrenic angle is noted. No significant increase in pleural effusion is noted. Persistent left basilar atelectasis is noted. IMPRESSION: No significant interval change from the prior exam. No significant increase in pleural effusion is noted. Electronically Signed   By: Alcide Clever M.D.   On: 06/09/2023 10:11   DG Chest Port 1 View Result Date: 06/08/2023 CLINICAL DATA:  Empyema. EXAM: PORTABLE CHEST 1 VIEW COMPARISON:   06/06/2023. FINDINGS: Redemonstration of heterogeneous opacification of the left hemithorax, overall decreased in density since the prior study, suggesting interval decrease in the loculated left pleural effusion, likely status post additional left pleural drainage placement. No pneumothorax. There also superimposed areas of atelectasis and or pneumonia. Please refer to recent chest CT scan for additional details. There are 2 left-sided pleural drainage catheters. Right lung is grossly clear. No dense consolidation or lung collapse. Right lateral costophrenic angle is clear. No pneumothorax. Stable cardio-mediastinal silhouette. No acute osseous abnormalities. The soft tissues are within normal limits. IMPRESSION: *Interval decrease in the loculated left pleural effusion, status post left-sided additional pleural drainage catheter placement. In total, there are now 2 left-sided pleural drainage catheters. No pneumothorax seen. Electronically Signed   By: Jules Schick M.D.   On: 06/08/2023 15:53   CT [facility] PLEURAL DRAIN W/INDWELL CATH W/IMG GUIDE Result Date: 06/07/2023 INDICATION: 33 year old male with history of complex left empyema. EXAM: CT PERC PLEURAL DRAIN W/INDWELL CATH W/IMG GUIDE COMPARISON:  None Available. MEDICATIONS: The patient is currently admitted to the hospital and receiving intravenous antibiotics. The antibiotics were administered within an appropriate time frame prior to the initiation of the procedure. ANESTHESIA/SEDATION: Moderate (conscious) sedation was employed during this procedure. A total of Versed 1 mg and Fentanyl 50 mcg was administered intravenously. Moderate Sedation Time: 10 minutes. The patient's level of consciousness and vital signs were monitored continuously by radiology nursing throughout the procedure under my direct supervision. CONTRAST:  None COMPLICATIONS: None immediate. PROCEDURE: RADIATION DOSE REDUCTION: This exam was performed according to the departmental  dose-optimization  program which includes automated exposure control, adjustment of the mA and/or kV according to patient size and/or use of iterative reconstruction technique. Informed written consent was obtained from the patient after a discussion of the risks, benefits and alternatives to treatment. The patient was placed supine, partially right lateral decubitus on the CT gantry and a pre procedural CT was performed re-demonstrating the known fluid collection within the left pleural space. The procedure was planned. A timeout was performed prior to the initiation of the procedure. The left posterolateral and inferior thorax was prepped and draped in the usual sterile fashion. The overlying soft tissues were anesthetized with 1% lidocaine with epinephrine. Appropriate trajectory was planned with the use of a 22 gauge spinal needle. An 18 gauge trocar needle was advanced into the pleural space and a short Amplatz super stiff wire was coiled within the collection. Appropriate positioning was confirmed with a limited CT scan. The tract was serially dilated allowing placement of a 14 French all-purpose drainage catheter. Appropriate positioning was confirmed with a limited postprocedural CT scan. The tube was connected to a pleurovac and sutured in place. A dressing was placed. The patient tolerated the procedure well without immediate post procedural complication. IMPRESSION: Successful CT guided placement of a 2 French all purpose drain catheter into the posterolateral and inferior left pleural space with aspiration of serous fluid. Marliss Coots, MD [facility] Electronically Signed   By: Marliss Coots M.D.   On: 06/07/2023 16:53   CT MAXILLOFACIAL W CONTRAST Result Date: 06/06/2023 CLINICAL DATA:  Dental caries EXAM: CT MAXILLOFACIAL WITH CONTRAST TECHNIQUE: Multidetector CT imaging of the maxillofacial structures was performed with intravenous  contrast. Multiplanar CT image reconstructions were also generated. RADIATION DOSE REDUCTION: This exam was performed according to the departmental dose-optimization program which includes automated exposure control, adjustment of the mA and/or kV according to patient size and/or use of iterative reconstruction technique. CONTRAST:  75mL OMNIPAQUE IOHEXOL 350 MG/ML SOLN COMPARISON:  None Available. FINDINGS: Osseous: No traumatic facial bone finding. Dental: Unerupted right maxillary tooth 1. Multiple caries evident affecting the anterior maxillary dentition from tooth 4 through tooth 12. Lucency around the roots particularly at teeth 8, 9 and 10. With respect to the mandibular dentition, there is advanced decay with multiple caries. Unfortunately there is a good bit of motion degradation. Pronounced lucency adjacent to the roots of the left mandibular dentition with a confluent root abscess in the right mandible affecting teeth 30 and 31. Orbits: Normal Sinuses: Clear Soft tissues: Negative Limited intracranial: Normal IMPRESSION: 1. Advanced dental and periodontal disease as outlined above. Electronically Signed   By: Paulina Fusi M.D.   On: 06/06/2023 22:39   CT CHEST WO CONTRAST Result Date: 06/06/2023 CLINICAL DATA:  Pneumonia complication EXAM: CT CHEST WITHOUT CONTRAST TECHNIQUE: Multidetector CT imaging of the chest was performed following the standard protocol without IV contrast. RADIATION DOSE REDUCTION: This exam was performed according to the departmental dose-optimization program which includes automated exposure control, adjustment of the mA and/or kV according to patient size and/or use of iterative reconstruction technique. COMPARISON:  CT abdomen 06/02/2023 FINDINGS: Cardiovascular: Heart size normal. Blood pool is hypodense compared to the interventricular septum suggesting anemia. Trace pericardial fluid. Central great vessels unremarkable. Mediastinum/Nodes: Anterior mediastinal soft tissue  density presumably residual thymic tissue. No definite mass or adenopathy, sensitivity decreased without contrast. Lungs/Pleura: Progressive moderate partially loculated left pleural effusion despite placement of pigtail chest tube anteromedially at the lung base. Scattered loculated  pleural gas bubbles may be related to chest tube placement versus infection. Persistent dense consolidation of the left lower lobe. Worsening dependent consolidation/atelectasis in the lingula and left upper lobe. Small right pleural effusion has developed. Consolidation with air bronchograms posteriorly in the right lower lobe. Subsegmental consolidation/atelectasis anteromedially in the right upper lobe. Upper Abdomen: No acute findings. Musculoskeletal: No chest wall mass or suspicious bone lesions identified. IMPRESSION: 1. Progressive moderate partially loculated left pleural effusion despite placement of pigtail chest tube. 2. Worsening dependent consolidation/atelectasis in the lingula and left upper lobe. 3. Persistent dense consolidation of the left lower lobe. 4. New small right pleural effusion. 5. New consolidation/atelectasis posteriorly in the right lower lobe. Electronically Signed   By: Corlis Leak M.D.   On: 06/06/2023 16:52   DG Chest Port 1V same Day Result Date: 06/06/2023 CLINICAL DATA:  141880 SOB (shortness of breath) [id number] EXAM: PORTABLE CHEST 1 VIEW COMPARISON:  Chest x-ray 06/05/2023, CT abdomen pelvis 06/02/2023 FINDINGS: Left chest tube with pigtail overlying the lower medial left hemithorax. The heart and mediastinal contours are within normal limits. Left lung airspace opacity. No pulmonary edema. Interval increase in size of a loculated moderate to large volume left pleural effusion. No right pleural effusion. No pneumothorax. No acute osseous abnormality. IMPRESSION: 1. Interval increase in size of a loculated moderate to large volume left pleural effusion. 2. Underlying left lung airspace opacity may  represent combination of infection/inflammation and atelectasis. 3. Left chest tube stable. Electronically Signed   By: Tish Frederickson M.D.   On: 06/06/2023 07:59   DG CHEST PORT 1 VIEW Result Date: 06/05/2023 CLINICAL DATA:  Pain EXAM: PORTABLE CHEST 1 VIEW COMPARISON:  Chest x-ray 06/05/2023 FINDINGS: Left-sided chest tube is unchanged in position. Left pleural effusion which appears partially loculated is unchanged from prior. There is no pneumothorax. There is a new band of opacity in the right suprahilar region. Patchy opacities in the left lower lung have increased. Cardiomediastinal silhouette is within normal limits. No acute fractures are seen. IMPRESSION: 1. Left-sided chest tube is unchanged in position. 2. Stable partially loculated left pleural effusion. 3. New band of opacity in the right suprahilar region. 4. Increasing left basilar air atelectasis/airspace disease. Electronically Signed   By: Darliss Cheney M.D.   On: 06/05/2023 18:30   DG CHEST PORT 1 VIEW Result Date: 06/05/2023 CLINICAL DATA:  Chest tube in place. EXAM: PORTABLE CHEST 1 VIEW COMPARISON:  Radiographs 06/04/2023 and 06/03/2023. Abdominal CT 06/02/2023. FINDINGS: 0527 hours. Left pigtail chest tube is unchanged in position, overlying the medial left lung base. Lobulated left pleural effusion appears mildly decreased in volume, and there is improved aeration of the left lung. The right lung is clear. No evidence of pneumothorax. The visualized heart size and mediastinal contours are stable. IMPRESSION: Mild decrease in volume of left pleural effusion with improved aeration of the left lung. No evidence of pneumothorax. Electronically Signed   By: Carey Bullocks M.D.   On: 06/05/2023 10:12   ECHOCARDIOGRAM COMPLETE Result Date: 06/04/2023    ECHOCARDIOGRAM REPORT   Patient Name:   Brent Bryant Date of Exam: 06/04/2023 Medical Rec #:  [id number]    Height:       68.0 in Accession #:    [id number]   Weight:       130.1 lb Date of  Birth:  [birth year]    BSA:          1.702 m Patient Age:    32 years  BP:           119/74 mmHg Patient Gender: M            HR:           111 bpm. Exam Location:  Inpatient Procedure: 2D Echo, Cardiac Doppler and Color Doppler Indications:    Endocarditis  History:        Patient has no prior history of Echocardiogram examinations.  Sonographer:    Amy Chionchio Referring Phys: Lorin Glass IMPRESSIONS  1. Left ventricular ejection fraction, by estimation, is 65 to 70%. Left ventricular ejection fraction by 2D MOD biplane is 68.5 %. The left ventricle has normal function. The left ventricle has no regional wall motion abnormalities. Left ventricular diastolic parameters were normal.  2. Right ventricular systolic function is normal. The right ventricular size is normal. Tricuspid regurgitation signal is inadequate for assessing PA pressure.  3. The mitral valve is grossly normal. No evidence of mitral valve regurgitation.  4. The aortic valve was not well visualized. Aortic valve regurgitation is not visualized.  5. The inferior vena cava is normal in size with <50% respiratory variability, suggesting right atrial pressure of 8 mmHg. Comparison(s): No prior Echocardiogram. Conclusion(s)/Recommendation(s): No evidence of valvular vegetations on this transthoracic echocardiogram. Consider a transesophageal echocardiogram to exclude infective endocarditis if clinically indicated. FINDINGS  Left Ventricle: Left ventricular ejection fraction, by estimation, is 65 to 70%. Left ventricular ejection fraction by 2D MOD biplane is 68.5 %. The left ventricle has normal function. The left ventricle has no regional wall motion abnormalities. The left ventricular internal cavity size was normal in size. There is no left ventricular hypertrophy. Left ventricular diastolic parameters were normal. Right Ventricle: The right ventricular size is normal. No increase in right ventricular wall thickness. Right ventricular systolic  function is normal. Tricuspid regurgitation signal is inadequate for assessing PA pressure. Left Atrium: Left atrial size was normal in size. Right Atrium: Right atrial size was normal in size. Pericardium: There is no evidence of pericardial effusion. Mitral Valve: The mitral valve is grossly normal. No evidence of mitral valve regurgitation. MV peak gradient, 2.2 mmHg. The mean mitral valve gradient is 1.0 mmHg. Tricuspid Valve: The tricuspid valve is normal in structure. Tricuspid valve regurgitation is not demonstrated. Aortic Valve: The aortic valve was not well visualized. Aortic valve regurgitation is not visualized. Aortic valve mean gradient measures 5.0 mmHg. Aortic valve peak gradient measures 10.8 mmHg. Aortic valve area, by VTI measures 2.26 cm. Pulmonic Valve: The pulmonic valve was normal in structure. Pulmonic valve regurgitation is not visualized. Aorta: The aortic root and ascending aorta are structurally normal, with no evidence of dilitation. Venous: The inferior vena cava is normal in size with less than 50% respiratory variability, suggesting right atrial pressure of 8 mmHg. IAS/Shunts: No atrial level shunt detected by color flow Doppler.  LEFT VENTRICLE PLAX 2D                        Biplane EF (MOD) LVIDd:         4.60 cm         LV Biplane EF:   Left LVIDs:         3.60 cm                          ventricular LV PW:         0.80 cm  ejection LV IVS:        0.60 cm                          fraction by LVOT diam:     2.10 cm                          2D MOD LV SV:         46                               biplane is LV SV Index:   27                               68.5 %. LVOT Area:     3.46 cm                                Diastology                                LV e' medial:    11.20 cm/s LV Volumes (MOD)               LV E/e' medial:  7.7 LV vol d, MOD    101.0 ml      LV e' lateral:   17.00 cm/s A2C:                           LV E/e' lateral: 5.1 LV vol d, MOD     126.0 ml A4C: LV vol s, MOD    25.3 ml A2C: LV vol s, MOD    50.4 ml A4C: LV SV MOD A2C:   75.7 ml LV SV MOD A4C:   126.0 ml LV SV MOD BP:    87.9 ml RIGHT VENTRICLE          IVC RV Basal diam:  2.70 cm  IVC diam: 1.50 cm TAPSE (M-mode): 2.3 cm LEFT ATRIUM             Index        RIGHT ATRIUM           Index LA Vol (A2C):   50.3 ml 29.55 ml/m  RA Area:     14.30 cm LA Vol (A4C):   44.7 ml 26.26 ml/m  RA Volume:   36.70 ml  21.56 ml/m LA Biplane Vol: 47.9 ml 28.14 ml/m  AORTIC VALVE                     PULMONIC VALVE AV Area (Vmax):    2.08 cm      PV Vmax:       0.96 m/s AV Area (Vmean):   2.31 cm      PV Peak grad:  3.6 mmHg AV Area (VTI):     2.26 cm AV Vmax:           164.00 cm/s AV Vmean:          107.000 cm/s AV VTI:            0.204 m AV Peak Grad:      10.8 mmHg AV Mean Grad:  5.0 mmHg LVOT Vmax:         98.40 cm/s LVOT Vmean:        71.300 cm/s LVOT VTI:          0.133 m LVOT/AV VTI ratio: 0.65  AORTA Ao Root diam: 3.30 cm Ao Asc diam:  2.90 cm MITRAL VALVE MV Area (PHT): 5.46 cm    SHUNTS MV Area VTI:   2.99 cm    Systemic VTI:  0.13 m MV Peak grad:  2.2 mmHg    Systemic Diam: 2.10 cm MV Mean grad:  1.0 mmHg MV Vmax:       0.75 m/s MV Vmean:      59.1 cm/s MV Decel Time: 139 msec MV E velocity: 86.40 cm/s MV A velocity: 71.10 cm/s MV E/A ratio:  1.22 Zoila Shutter MD Electronically signed by Zoila Shutter MD Signature Date/Time: 06/04/2023/1:37:10 PM    Final    DG Chest Port 1 View Result Date: 06/04/2023 CLINICAL DATA:  Chest tube in place EXAM: PORTABLE CHEST 1 VIEW COMPARISON:  Yesterday FINDINGS: Numerous leads and wires project over the chest. Minimal tracheal deviation to the right. Mild cardiomegaly. A left-sided pleural pigtail catheter is unchanged in position inferiorly. No significant change in moderate left pleural effusion with extensive loculation superiorly and laterally. Relatively diffuse left-sided airspace disease is also similar. The right lung remains clear. IMPRESSION:  Relatively similar appearance of loculated left-sided pleural fluid and diffuse left-sided airspace disease. Left-sided pleural catheter remaining in place. Electronically Signed   By: Jeronimo Greaves M.D.   On: 06/04/2023 09:34   DG Chest Port 1 View Result Date: 06/03/2023 CLINICAL DATA:  Chest tube in place. EXAM: PORTABLE CHEST 1 VIEW COMPARISON:  Same day. FINDINGS: Stable cardiomediastinal silhouette. Right lung is clear. Interval placement of pleural drainage catheter in the left lung base. Left pleural effusion is significantly enlarged compared to prior exam with left upper lobe atelectasis or infiltrate. IMPRESSION: Interval placement of pleural drainage catheter into the left lung base. Left pleural effusion is significantly enlarged compared to prior exam with left upper lobe atelectasis or infiltrate. Electronically Signed   By: Lupita Raider M.D.   On: 06/03/2023 16:59   DG Chest 2 View Result Date: 06/03/2023 CLINICAL DATA:  Cough EXAM: CHEST - 2 VIEW COMPARISON:  04/10/2020 FINDINGS: Loculated left pleural effusion with left lower lobe consolidation. Mild vascular congestion of the right lung. Normal cardiomediastinal contours. IMPRESSION: Loculated left pleural effusion with left lower lobe consolidation, concerning for pneumonia. Electronically Signed   By: Deatra Robinson M.D.   On: 06/03/2023 02:20   CT ABDOMEN PELVIS W CONTRAST Result Date: 06/02/2023 CLINICAL DATA:  Left flank pain EXAM: CT ABDOMEN AND PELVIS WITH CONTRAST TECHNIQUE: Multidetector CT imaging of the abdomen and pelvis was performed using the standard protocol following bolus administration of intravenous contrast. RADIATION DOSE REDUCTION: This exam was performed according to the departmental dose-optimization program which includes automated exposure control, adjustment of the mA and/or kV according to patient size and/or use of iterative reconstruction technique. CONTRAST:  OMNIPAQUE IOHEXOL 300 MG/ML  SOLN  COMPARISON:  09/25/2018 FINDINGS: Lower Chest: Loculated left pleural effusion with left lower lobe consolidation. Small area of right middle lobe consolidation anteriorly. Hepatobiliary: Normal hepatic contours. No intra- or extrahepatic biliary dilatation. The gallbladder is normal. Pancreas: Normal pancreas. No ductal dilatation or peripancreatic fluid collection. Spleen: Normal. Adrenals/Urinary Tract: The adrenal glands are normal. No hydronephrosis, nephroureterolithiasis or solid renal mass. The urinary bladder is  normal for degree of distention Stomach/Bowel: There is no hiatal hernia. Normal duodenal course and caliber. No small bowel dilatation or inflammation. No focal colonic abnormality. Normal appendix. Vascular/Lymphatic: Normal course and caliber of the major abdominal vessels. No abdominal or pelvic lymphadenopathy. Reproductive: Normal prostate size with symmetric seminal vesicles. Other: None. Musculoskeletal: No bony spinal canal stenosis or focal osseous abnormality. IMPRESSION: 1. Loculated left pleural effusion with left lower lobe consolidation, concerning for post-obstructive pneumonia. 2. Small area of right middle lobe consolidation anteriorly, also concerning for pneumonia. 3. No acute abnormality of the abdomen or pelvis. Electronically Signed   By: Deatra Robinson M.D.   On: 06/02/2023 23:53    Micro Results     Recent Results (from the past 240 hours)  Body fluid culture w Gram Stain     Status: None   Collection Time: 06/03/23  4:35 PM   Specimen: Pleural Fluid  Result Value Ref Range Status   Specimen Description FLUID PLEURAL  Final   Special Requests NONE  Final   Gram Stain   Final    FEW WBC PRESENT,BOTH PMN AND MONONUCLEAR NO ORGANISMS SEEN    Culture   Final    NO GROWTH 3 DAYS Performed at [facility], [address], [place], [place] [address]    Report Status 06/07/2023 FINAL  Final  Fungus Culture With Stain     Status: None (Preliminary result)    Collection Time: 06/03/23  4:35 PM   Specimen: Pleural, Left; Pleural Fluid  Result Value Ref Range Status   Fungus Stain Final report  Final    Comment: (NOTE) Performed At: [facility] [address] [place], [place] [id number] Jolene Schimke MD AO:[id number]    Fungus (Mycology) Culture PENDING  Incomplete   Fungal Source FLUID  Final    Comment: PLEURAL Performed at [facility], [address] [place], [place] [address]   Fungus Culture Result     Status: None   Collection Time: 06/03/23  4:35 PM  Result Value Ref Range Status   Result 1 Comment  Final    Comment: (NOTE) KOH/Calcofluor preparation:  no fungus observed. Performed At: [facility] [address] [place], [place] [id number] Jolene Schimke MD NU:[id number]   MRSA Next Gen by PCR, Nasal     Status: None   Collection Time: 06/04/23 12:20 AM   Specimen: Nasal Mucosa; Nasal Swab  Result Value Ref Range Status   MRSA by PCR Next Gen NOT DETECTED NOT DETECTED Final    Comment: (NOTE) The GeneXpert MRSA Assay (FDA approved for NASAL specimens only), is one component of a comprehensive MRSA colonization surveillance program. It is not intended to diagnose MRSA infection nor to guide or monitor treatment for MRSA infections. Test performance is not FDA approved in patients less than 49 years old. Performed at [facility], [address], [place], [place] [address]   Culture, blood (Routine X 2) w Reflex to ID Panel     Status: None   Collection Time: 06/06/23  7:46 AM   Specimen: BLOOD RIGHT HAND  Result Value Ref Range Status   Specimen Description BLOOD RIGHT HAND  Final   Special Requests   Final    BOTTLES DRAWN AEROBIC AND ANAEROBIC Blood Culture results may not be optimal due to an inadequate volume of blood received in culture bottles   Culture   Final    NO GROWTH 5 DAYS Performed at [facility],  [address], [place], [place] [address]    Report Status  06/11/2023 FINAL  Final  Culture, blood (Routine X 2) w Reflex to ID Panel     Status: None   Collection Time: 06/06/23  7:46 AM   Specimen: BLOOD LEFT ARM  Result Value Ref Range Status   Specimen Description BLOOD LEFT ARM  Final   Special Requests   Final    BOTTLES DRAWN AEROBIC AND ANAEROBIC Blood Culture results may not be optimal due to an inadequate volume of blood received in culture bottles   Culture   Final    NO GROWTH 5 DAYS Performed at [facility], [address], [place], [place] [address]    Report Status 06/11/2023 FINAL  Final    Today   Subjective    Josiel Trickel today has no headache,no chest abdominal pain,no new weakness tingling or numbness, feels much better wants to go home today.     Objective   Blood pressure 109/69, pulse 89, temperature 99.5 F (37.5 C), temperature source Oral, resp. rate 17, height 5\' 8"  (1.727 m), weight 59.9 kg, SpO2 94%.  No intake or output data in the 24 hours ending 06/13/23 0823  Exam  Awake Alert, No new F.N deficits,    [place].AT,PERRAL Supple Neck,   Symmetrical Chest wall movement, Good air movement bilaterally, CTAB RRR,No Gallops,   +ve B.Sounds, Abd Soft, Non tender,  No Cyanosis, Clubbing or edema    Data Review   Recent Labs  Lab 06/07/23 0445 06/08/23 0437 06/09/23 0546 06/10/23 0524 06/11/23 0626 06/12/23 0429  WBC 17.6* 16.0* 14.1* 11.4* 10.4 8.6  HGB 9.6* 10.4* 9.7* 9.9* 9.6* 9.6*  HCT 28.2* 31.1* 28.9* 30.0* 29.1* 29.3*  PLT 513* 734* 831* 899* 949* 866*  MCV 91.0 91.5 91.7 92.9 93.9 93.6  MCH 31.0 30.6 30.8 30.7 31.0 30.7  MCHC 34.0 33.4 33.6 33.0 33.0 32.8  RDW 14.6 14.3 14.1 13.9 13.9 14.0  LYMPHSABS 2.6  --   --   --  1.6 2.5  MONOABS 1.5*  --   --   --  0.5 0.8  EOSABS 0.1  --   --   --  0.2 0.1  BASOSABS 0.1  --   --   --  0.0 0.1    Recent Labs  Lab 06/07/23 0445 06/08/23 0437 06/09/23 0546 06/10/23 0524 06/11/23 0626 06/12/23 0429  NA 128* 130* 129* 134*  --  136  K  4.1 4.3 4.6 4.5  --  3.9  CL 97* 97* 95* 93*  --  94*  CO2 24 26 26 24   --  26  ANIONGAP 7 7 8  17*  --  16*  GLUCOSE 111* 90 94 102*  --  92  BUN 6 <5* 5* 6  --  5*  CREATININE 0.47* 0.51* 0.63 0.58*  --  0.60*  AST 18  --   --   --   --  34  ALT 11  --   --   --   --  25  ALKPHOS 72  --   --   --   --  59  BILITOT 0.3  --   --   --   --  0.3  ALBUMIN <1.5*  --   --   --   --  1.8*  CRP  --   --   --   --  5.9* 3.9*  PROCALCITON  --   --   --   --  0.23 0.15  TSH 4.249  --   --   --   --   --   BNP  --   --   --   --  13.6 10.2  MG 1.6* 1.9  --   --  1.8 1.8  PHOS 3.8 4.3  --   --  4.8* 4.9*  CALCIUM 7.4* 7.5* 8.0* 9.0  --  9.1    Total Time in preparing paper work, data evaluation and todays exam - 35 minutes  Signature  -    Susa Raring M.D on 06/13/2023 at 8:23 AM   -  To page go to [email or web address]

## 2023-06-13 NOTE — Progress Notes (Signed)
AVS reviewed with pt by charge RN Desiray. Pt in NAD prior to discharge home.

## 2023-06-13 NOTE — Plan of Care (Signed)
  Problem: Education: Goal: Knowledge of General Education information will improve Description: Including pain rating scale, medication(s)/side effects and non-pharmacologic comfort measures Outcome: Progressing   Problem: Health Behavior/Discharge Planning: Goal: Ability to manage health-related needs will improve Outcome: Progressing   Problem: Clinical Measurements: Goal: Ability to maintain clinical measurements within normal limits will improve Outcome: Progressing Goal: Will remain free from infection Outcome: Progressing Goal: Diagnostic test results will improve Outcome: Progressing Goal: Respiratory complications will improve Outcome: Progressing Goal: Cardiovascular complication will be avoided Outcome: Progressing   Problem: Activity: Goal: Risk for activity intolerance will decrease Outcome: Progressing   Problem: Nutrition: Goal: Adequate nutrition will be maintained Outcome: Progressing   Problem: Coping: Goal: Level of anxiety will decrease Outcome: Progressing   Problem: Elimination: Goal: Will not experience complications related to bowel motility Outcome: Progressing Goal: Will not experience complications related to urinary retention Outcome: Progressing   Problem: Pain Managment: Goal: General experience of comfort will improve and/or be controlled Outcome: Progressing   Problem: Safety: Goal: Ability to remain free from injury will improve Outcome: Progressing   Problem: Skin Integrity: Goal: Risk for impaired skin integrity will decrease Outcome: Progressing   Problem: Activity: Goal: Ability to tolerate increased activity will improve Outcome: Progressing   Problem: Respiratory: Goal: Ability to maintain adequate ventilation will improve Outcome: Progressing Goal: Ability to maintain a clear airway will improve Outcome: Progressing

## 2023-06-13 NOTE — Discharge Instructions (Signed)
Follow with Primary MD Dartha Lodge, [name] in 7 days   Get CBC, CMP, 2 view Chest X ray -  checked next visit with your primary MD    Activity: As tolerated with Full fall precautions use walker/cane & assistance as needed  Disposition Home    Diet: Heart Healthy    Special Instructions: If you have smoked or chewed Tobacco  in the last 2 yrs please stop smoking, stop any regular Alcohol  and or any Recreational drug use.  On your next visit with your primary care physician please Get Medicines reviewed and adjusted.  Please request your Prim.MD to go over all Hospital Tests and Procedure/Radiological results at the follow up, please get all Hospital records sent to your Prim MD by signing hospital release before you go home.  If you experience worsening of your admission symptoms, develop shortness of breath, life threatening emergency, suicidal or homicidal thoughts you must seek medical attention immediately by calling 911 or calling your MD immediately  if symptoms less severe.  You Must read complete instructions/literature along with all the possible adverse reactions/side effects for all the Medicines you take and that have been prescribed to you. Take any new Medicines after you have completely understood and accpet all the possible adverse reactions/side effects.   Do not drive when taking Pain medications.  Do not take more than prescribed Pain, Sleep and Anxiety Medications  Wear Seat belts while driving.   Please note  You were cared for by a hospitalist during your hospital stay. If you have any questions about your discharge medications or the care you received while you were in the hospital after you are discharged, you can call the unit and asked to speak with the hospitalist on call if the hospitalist that took care of you is not available. Once you are discharged, your primary care physician will handle any further medical issues. Please note that NO REFILLS for any  discharge medications will be authorized once you are discharged, as it is imperative that you return to your primary care physician (or establish a relationship with a primary care physician if you do not have one) for your aftercare needs so that they can reassess your need for medications and monitor your lab values.

## 2023-06-13 NOTE — TOC Transition Note (Signed)
Transition of Care [facility]) - Discharge Note   Patient Details  Name: Brent Bryant MRN: [id number] Date of Birth: 1990-10-04  [facility] CM/SW Contact:  Gordy Clement, RN Phone Number: 06/13/2023, 8:48 AM   Clinical Narrative:    Patient to DC to home today  TOC has provided [facility] for DC meds. No additional TOC needs identified. Patient will follow up as directed on AVS.        Barriers to Discharge: Continued Medical Work up   Patient Goals and CMS Choice            Discharge Placement                       Discharge Plan and Services Additional resources added to the After Visit Summary for   In-house Referral: Artist                                   Social Drivers of Health (SDOH) Interventions SDOH Screenings   Food Insecurity: Patient Unable To Answer (06/04/2023)  Housing: Patient Unable To Answer (06/04/2023)  Transportation Needs: Patient Unable To Answer (06/04/2023)  Social Connections: Unknown (09/13/2021)   Received from [facility]  Tobacco Use: High Risk (06/07/2023)     Readmission Risk Interventions     No data to display

## 2023-06-22 ENCOUNTER — Ambulatory Visit (HOSPITAL_COMMUNITY)
Admission: RE | Admit: 2023-06-22 | Discharge: 2023-06-22 | Disposition: A | Discharge status: Home / Self Care | Payer: MEDICAID | Source: Ambulatory Visit | Attending: Infectious Diseases | Admitting: Infectious Diseases

## 2023-06-22 ENCOUNTER — Encounter: Payer: Self-pay | Admitting: Infectious Diseases

## 2023-06-22 ENCOUNTER — Other Ambulatory Visit: Payer: Self-pay

## 2023-06-22 ENCOUNTER — Ambulatory Visit: Payer: Self-pay | Admitting: Infectious Diseases

## 2023-06-22 VITALS — BP 115/81 | HR 105 | Temp 98.2°F | Ht 69.0 in | Wt 125.0 lb

## 2023-06-22 DIAGNOSIS — F17200 Nicotine dependence, unspecified, uncomplicated: Secondary | ICD-10-CM

## 2023-06-22 DIAGNOSIS — J869 Pyothorax without fistula: Secondary | ICD-10-CM

## 2023-06-22 DIAGNOSIS — B182 Chronic viral hepatitis C: Secondary | ICD-10-CM

## 2023-06-22 DIAGNOSIS — B9689 Other specified bacterial agents as the cause of diseases classified elsewhere: Secondary | ICD-10-CM

## 2023-06-22 DIAGNOSIS — K056 Periodontal disease, unspecified: Secondary | ICD-10-CM

## 2023-06-22 DIAGNOSIS — Z23 Encounter for immunization: Secondary | ICD-10-CM

## 2023-06-22 DIAGNOSIS — Z5181 Encounter for therapeutic drug level monitoring: Secondary | ICD-10-CM | POA: Insufficient documentation

## 2023-06-22 DIAGNOSIS — IMO0001 Reserved for inherently not codable concepts without codable children: Secondary | ICD-10-CM

## 2023-06-22 LAB — HCV RT-PCR, QUANT (NON-GRAPH)

## 2023-06-22 LAB — HCV AB W REFLEX TO QUANT PCR: HCV Ab: REACTIVE — AB

## 2023-06-22 NOTE — Progress Notes (Unsigned)
 Patient Active Problem List   Diagnosis Date Noted   Chronic hepatitis C without hepatic coma (HCC) 06/12/2023   Pleural effusion 06/08/2023   Empyema (HCC) 06/07/2023   Protein-calorie malnutrition, severe 06/06/2023   Empyema lung (HCC) 06/05/2023   Polysubstance abuse (HCC) 06/04/2023   Severe sepsis (HCC) 06/04/2023   CAP (community acquired pneumonia) 06/03/2023    Patient's Medications  New Prescriptions   No medications on file  Previous Medications   ACETAMINOPHEN (TYLENOL) 500 MG TABLET    Take 1 tablet (500 mg total) by mouth every 8 (eight) hours as needed for mild pain (pain score 1-3).   AMOXICILLIN-CLAVULANATE (AUGMENTIN) 875-125 MG TABLET    Take 1 tablet by mouth 2 (two) times daily for 24 days.   NAPROXEN (NAPROSYN) 500 MG TABLET    Take 1 tablet (500 mg total) by mouth 2 (two) times daily as needed for moderate pain (pain score 4-6). As needed for pain  Modified Medications   No medications on file  Discontinued Medications   No medications on file    Subjective: 33 Y O male with pmh of polysubstance abuse, chronic HCV who is here for HFU after recent hospital admission ( 1/31-2/11) for complicated pna/empyema. Hospital course significant for chest tube placement on 2/1 and second chest tube 2/5 as well as requiring pleural lytics. Both CT were removed 2/9. Seen by ID and PCCM in patient. Was on IV abtx in the hospital transitioned to PO augmentin and discharged on 2/11 to complete 4 weeks course, EOT 07/05/23.   06/22/23 Reports taking augmentin twice a day. No missed doses or concerns with abtx. Breathing is significantly better and almost back to baseline, minimal cough with phlegm. Denies cough. Feels energy has come back. Appetite is good. Lives with parents. Cut down smoking from 10-15 to 3-4 a day. Denies alcohol and recreational drug use. He is willing to get required labs and imaging for treatment of HCV. No other complaints.   Review of Systems: Denies  fevers, chills. Denies nausea, vomiting or diarrhea   Past Medical History:  Diagnosis Date   Allergy    Hepatitis C    No past surgical history on file.  Social History   Tobacco Use   Smoking status: Every Day    Current packs/day: 0.50    Average packs/day: 0.5 packs/day for 12.0 years (6.0 ttl pk-yrs)    Types: Cigarettes   Smokeless tobacco: Never  Vaping Use   Vaping status: Never Used  Substance Use Topics   Alcohol use: No   Drug use: Yes    Types: Marijuana    Family History  Problem Relation Age of Onset   Diabetes Mother    Healthy Father    Stroke Neg Hx    Cancer Neg Hx     Allergies  Allergen Reactions   Tylenol [Acetaminophen] Rash   Vicodin [Hydrocodone-Acetaminophen] Rash    Health Maintenance  Topic Date Due   Pneumococcal Vaccine 76-8 Years old (1 of 2 - PCV) Never done   INFLUENZA VACCINE  Never done   COVID-19 Vaccine (1 - 2024-25 season) Never done   DTaP/Tdap/Td (2 - Td or Tdap) 12/11/2023   Hepatitis C Screening  Completed   HIV Screening  Completed   HPV VACCINES  Aged Out    Objective:  Vitals:   06/22/23 1349  BP: 115/81  Pulse: (!) 105  Temp: 98.2 F (36.8 C)  TempSrc: Temporal  SpO2: 98%  Weight: 125  lb (56.7 kg)  Height: 5\' 9"  (1.753 m)   Body mass index is 18.46 kg/m.  Physical Exam Constitutional:      Appearance: Normal appearance.  HENT:     Head: Normocephalic and atraumatic.      Mouth: Mucous membranes are moist.  Eyes:    Conjunctiva/sclera: Conjunctivae normal.     Pupils: Pupils are equal, round, and b/l symmetrical  Cardiovascular:     Rate and Rhythm: Normal rate and regular rhythm.     Heart sounds: s1s2  Pulmonary:     Effort: Pulmonary effort is normal.     Breath sounds: Normal breath sounds.   Abdominal:     General: Non distended     Palpations: soft.   Musculoskeletal:        General: Normal range of motion.   Skin:    General: Skin is warm and dry.      Comments:  Neurological:     General: grossly non focal     Mental Status: awake, alert and oriented to person, place, and time.   Psychiatric:        Mood and Affect: Mood normal.   Lab Results Lab Results  Component Value Date   WBC 8.6 06/12/2023   HGB 9.6 (L) 06/12/2023   HCT 29.3 (L) 06/12/2023   MCV 93.6 06/12/2023   PLT 866 (H) 06/12/2023    Lab Results  Component Value Date   CREATININE 0.60 (L) 06/12/2023   BUN 5 (L) 06/12/2023   NA 136 06/12/2023   K 3.9 06/12/2023   CL 94 (L) 06/12/2023   CO2 26 06/12/2023    Lab Results  Component Value Date   ALT 25 06/12/2023   AST 34 06/12/2023   ALKPHOS 59 06/12/2023   BILITOT 0.3 06/12/2023    No results found for: "CHOL", "HDL", "LDLCALC", "LDLDIRECT", "TRIG", "CHOLHDL" No results found for: "LABRPR", "RPRTITER" No results found for: "HIV1RNAQUANT", "HIV1RNAVL", "CD4TABS"  Microbiology Results for orders placed or performed during the hospital encounter of 06/02/23  Resp panel by RT-PCR (RSV, Flu A&B, Covid) Anterior Nasal Swab     Status: None   Collection Time: 06/02/23  6:16 PM   Specimen: Anterior Nasal Swab  Result Value Ref Range Status   SARS Coronavirus 2 by RT PCR NEGATIVE NEGATIVE Final    Comment: (NOTE) SARS-CoV-2 target nucleic acids are NOT DETECTED.  The SARS-CoV-2 RNA is generally detectable in upper respiratory specimens during the acute phase of infection. The lowest concentration of SARS-CoV-2 viral copies this assay can detect is 138 copies/mL. A negative result does not preclude SARS-Cov-2 infection and should not be used as the sole basis for treatment or other patient management decisions. A negative result may occur with  improper specimen collection/handling, submission of specimen other than nasopharyngeal swab, presence of viral mutation(s) within the areas targeted by this assay, and inadequate number of viral copies(<138 copies/mL). A negative result must be combined  with clinical observations, patient history, and epidemiological information. The expected result is Negative.  Fact Sheet for Patients:  BloggerCourse.com  Fact Sheet for Healthcare Providers:  SeriousBroker.it  This test is no t yet approved or cleared by the [place] FDA and  has been authorized for detection and/or diagnosis of SARS-CoV-2 by FDA under an Emergency Use Authorization (EUA). This EUA will remain  in effect (meaning this test can be used) for the duration of the COVID-19 declaration under Section 564(b)(1) of the Act, 21 U.S.C.section 360bbb-3(b)(1), unless the authorization  is terminated  or revoked sooner.       Influenza A by PCR NEGATIVE NEGATIVE Final   Influenza B by PCR NEGATIVE NEGATIVE Final    Comment: (NOTE) The Xpert Xpress SARS-CoV-2/FLU/RSV plus assay is intended as an aid in the diagnosis of influenza from Nasopharyngeal swab specimens and should not be used as a sole basis for treatment. Nasal washings and aspirates are unacceptable for Xpert Xpress SARS-CoV-2/FLU/RSV testing.  Fact Sheet for Patients: BloggerCourse.com  Fact Sheet for Healthcare Providers: SeriousBroker.it  This test is not yet approved or cleared by the [place] FDA and has been authorized for detection and/or diagnosis of SARS-CoV-2 by FDA under an Emergency Use Authorization (EUA). This EUA will remain in effect (meaning this test can be used) for the duration of the COVID-19 declaration under Section 564(b)(1) of the Act, 21 U.S.C. section 360bbb-3(b)(1), unless the authorization is terminated or revoked.     Resp Syncytial Virus by PCR NEGATIVE NEGATIVE Final    Comment: (NOTE) Fact Sheet for Patients: BloggerCourse.com  Fact Sheet for Healthcare Providers: SeriousBroker.it  This test is not yet approved  or cleared by the [place] FDA and has been authorized for detection and/or diagnosis of SARS-CoV-2 by FDA under an Emergency Use Authorization (EUA). This EUA will remain in effect (meaning this test can be used) for the duration of the COVID-19 declaration under Section 564(b)(1) of the Act, 21 U.S.C. section 360bbb-3(b)(1), unless the authorization is terminated or revoked.  Performed at [facility], [address] [place], [place] [address]   Culture, blood (Routine X 2) w Reflex to ID Panel     Status: None   Collection Time: 06/03/23  1:30 AM   Specimen: BLOOD LEFT FOREARM  Result Value Ref Range Status   Specimen Description   Final    BLOOD LEFT FOREARM Performed at [facility], [address] [place], [place] [address]    Special Requests   Final    BOTTLES DRAWN AEROBIC AND ANAEROBIC Blood Culture results may not be optimal due to an inadequate volume of blood received in culture bottles Performed at [facility], [address] [place], [place] [address]    Culture   Final    NO GROWTH 5 DAYS Performed at [facility], [address], [place], [place] [address]    Report Status 06/08/2023 FINAL  Final  Culture, blood (Routine X 2) w Reflex to ID Panel     Status: None   Collection Time: 06/03/23  1:39 AM   Specimen: BLOOD LEFT HAND  Result Value Ref Range Status   Specimen Description   Final    BLOOD LEFT HAND Performed at [facility], [address] [place], [place] [address]    Special Requests   Final    BOTTLES DRAWN AEROBIC AND ANAEROBIC Blood Culture adequate volume Performed at [facility], [address], [place], [place] [address]    Culture   Final    NO GROWTH 5 DAYS Performed at [facility], [address] [place], [place] [address]    Report Status 06/08/2023 FINAL  Final  Expectorated Sputum Assessment w Gram Stain, Rflx  to Resp Cult     Status: None   Collection Time: 06/03/23  4:48 AM   Specimen: Sputum  Result Value Ref Range Status   Specimen Description   Final    SPUTUM Performed at [facility], [address] [place], [place]  [address]    Special Requests   Final    Normal Performed at [facility], [address], [place], [place] [address]    Sputum evaluation   Final    THIS SPECIMEN IS ACCEPTABLE FOR SPUTUM CULTURE Performed at [facility], [address] [place], [place] [address]    Report Status 06/03/2023 FINAL  Final  Culture, Respiratory w Gram Stain     Status: None   Collection Time: 06/03/23  4:48 AM   Specimen: SPU  Result Value Ref Range Status   Specimen Description SPUTUM  Final   Special Requests Normal Reflexed from [id number]  Final   Gram Stain   Final    MODERATE WBC PRESENT,BOTH PMN AND MONONUCLEAR FEW GRAM POSITIVE COCCI IN PAIRS FEW GRAM NEGATIVE RODS RARE YEAST WITH PSEUDOHYPHAE    Culture   Final    RARE STREPTOCOCCUS GROUP F Beta hemolytic streptococci are predictably susceptible to penicillin and other beta lactams. Susceptibility testing not routinely performed. WITHIN NORMAL RESPIRATORY FLORA Performed at [facility], [address], [place], [place] [address]    Report Status 06/06/2023 FINAL  Final  Body fluid culture w Gram Stain     Status: None   Collection Time: 06/03/23  4:35 PM   Specimen: Pleural Fluid  Result Value Ref Range Status   Specimen Description FLUID PLEURAL  Final   Special Requests NONE  Final   Gram Stain   Final    FEW WBC PRESENT,BOTH PMN AND MONONUCLEAR NO ORGANISMS SEEN    Culture   Final    NO GROWTH 3 DAYS Performed at [facility], [address] [place], [place] [address]    Report Status 06/07/2023 FINAL  Final  Fungus Culture With Stain     Status: None (Preliminary result)   Collection Time: 06/03/23  4:35 PM   Specimen: Pleural, Left; Pleural Fluid   Result Value Ref Range Status   Fungus Stain Final report  Final    Comment: (NOTE) Performed At: [facility] [address] [place], [place] [id number] Jolene Schimke MD MW:[id number]    Fungus (Mycology) Culture PENDING  Incomplete   Fungal Source FLUID  Final    Comment: PLEURAL Performed at [facility], [address], [place], [place] [address]   Fungus Culture Result     Status: None   Collection Time: 06/03/23  4:35 PM  Result Value Ref Range Status   Result 1 Comment  Final    Comment: (NOTE) KOH/Calcofluor preparation:  no fungus observed. Performed At: [facility] [address] [place], [place] [id number] Jolene Schimke MD VZ:[id number]   MRSA Next Gen by PCR, Nasal     Status: None   Collection Time: 06/04/23 12:20 AM   Specimen: Nasal Mucosa; Nasal Swab  Result Value Ref Range Status   MRSA by PCR Next Gen NOT DETECTED NOT DETECTED Final    Comment: (NOTE) The GeneXpert MRSA Assay (FDA approved for NASAL specimens only), is one component of a comprehensive MRSA colonization surveillance program. It is not intended to diagnose MRSA infection nor to guide or monitor treatment for MRSA infections. Test performance is not FDA approved in patients less than 30 years old. Performed at [facility], [address] [place], [place] [address]   Culture, blood (Routine X 2) w Reflex to ID Panel     Status: None   Collection Time: 06/06/23  7:46 AM   Specimen: BLOOD  RIGHT HAND  Result Value Ref Range Status   Specimen Description BLOOD RIGHT HAND  Final   Special Requests   Final    BOTTLES DRAWN AEROBIC AND ANAEROBIC Blood Culture results may not be optimal due to an inadequate volume of blood received in culture bottles   Culture   Final    NO GROWTH 5 DAYS Performed at [facility], [address] [place], [place] [address]    Report Status 06/11/2023 FINAL  Final  Culture, blood (Routine X 2) w Reflex to ID Panel      Status: None   Collection Time: 06/06/23  7:46 AM   Specimen: BLOOD LEFT ARM  Result Value Ref Range Status   Specimen Description BLOOD LEFT ARM  Final   Special Requests   Final    BOTTLES DRAWN AEROBIC AND ANAEROBIC Blood Culture results may not be optimal due to an inadequate volume of blood received in culture bottles   Culture   Final    NO GROWTH 5 DAYS Performed at [facility], [address], [place], [place] [address]    Report Status 06/11/2023 FINAL  Final   Imaging DG CHEST PORT 1 VIEW Result Date: 06/12/2023 CLINICAL DATA:  Follow-up pleural effusion EXAM: PORTABLE CHEST 1 VIEW COMPARISON:  06/11/2023 FINDINGS: Cardiac shadow is stable. Aortic calcifications are seen. Lungs are well aerated bilaterally. Elevation of left hemidiaphragm is again seen. No recurrent pneumothorax is noted. Persistent atelectasis is noted in the left base. IMPRESSION: No recurrent pneumothorax seen. No change from the prior exam. Electronically Signed   By: Alcide Clever M.D.   On: 06/12/2023 11:14   DG CHEST PORT 1 VIEW Result Date: 06/11/2023 CLINICAL DATA:  Status post chest tube removal.  Pleural effusion. EXAM: PORTABLE CHEST 1 VIEW COMPARISON:  Chest CT dated 06/11/2023. FINDINGS: Interval removal of the left-sided chest tubes. There is mild eventration of the left hemidiaphragm. Left lung streaky densities noted. No large pleural effusion. No pneumothorax. Right lung is clear. The cardiac silhouette is within normal limits. No acute osseous pathology. IMPRESSION: Interval removal of the left-sided chest tubes. No pneumothorax. Electronically Signed   By: Elgie Collard M.D.   On: 06/11/2023 21:16   CT CHEST WO CONTRAST Result Date: 06/11/2023 CLINICAL DATA:  Empyema follow-up. EXAM: CT CHEST WITHOUT CONTRAST TECHNIQUE: Multidetector CT imaging of the chest was performed following the standard protocol without IV contrast. RADIATION DOSE REDUCTION: This exam was performed according to the  departmental dose-optimization program which includes automated exposure control, adjustment of the mA and/or kV according to patient size and/or use of iterative reconstruction technique. COMPARISON:  The last chest CT was chest CT without contrast 06/06/2023. Comparison is also made with subsequent portable chest films from February 6-8, 2025. FINDINGS: Cardiovascular: The cardiac size is normal. There is minimal pericardial fluid, unchanged. The aorta and great vessels are normal in caliber. The pulmonary arteries and veins are normal in caliber. There are no visible calcifications in the aorta and coronary arteries. Mediastinum/Nodes: There appears to be a few mildly prominent left hilar lymph nodes, but this is difficult to evaluate without contrast. No new contour deforming abnormality is seen. There is probably no further intrathoracic adenopathy but subject contrast is further limited by very little body fat. Lower poles of the thyroid gland, axillary spaces, thoracic esophagus are unremarkable. The trachea and both main bronchi are clear. Lungs/Pleura: A pigtail chest tube continues to be present in the medial left chest base  entering from anterolaterally then curving posteriorly abutting the mediastinum next to the distal esophagus. The more recently placed chest tube enters through the posterolateral left seventh intercostal space extending 1 intercostal level superiorly with the pigtail superimposing medially to the sixth intercostal space. There is a very small sliver of a pneumothorax persisting anteriorly in the upper to mid chest on 4:63, much smaller than previously noted with resolution of the prior hydro component of this collection. There is probably no change in overall aeration over recent chest films but certainly dramatic improvement since February 4. Most of the large volume of pleural fluid on February 4 is now gone with a small residual loculated pleural fluid collection in the posterior  left chest base extending up the paraspinal area medially to the level of the carina. There is patchy airspace disease in the left lower lobe along with irregular cavitation and volume loss to the superior segment. In the lingular base there is coarse linear atelectasis or scarring. There is a small consolidation anteromedially in the right upper lobe which has improved. Rest of the lungs are clear. Layering right pleural fluid on the prior study has also resolved. Upper Abdomen: No acute abnormality. Musculoskeletal: No chest wall mass or suspicious bone lesions identified. IMPRESSION: 1. There is a very small sliver of a pneumothorax persisting anteriorly in the upper to mid chest, much smaller than previously noted with resolution of the prior hydro component of this collection. 2. Most of the large volume of pleural fluid on February 4 is now gone with a small residual loculated pleural fluid collection in the posterior left chest base extending up the paraspinal area medially to the level of the carina. 3. Patchy airspace disease in the left lower lobe along with irregular cavitation and volume loss to the superior segment. 4. Small consolidation anteromedially in the right upper lobe which has improved. 5. Minimal pericardial fluid, unchanged. 6. Mildly prominent left hilar lymph nodes, difficult to evaluate without contrast. Electronically Signed   By: Almira Bar M.D.   On: 06/11/2023 07:23   DG CHEST PORT 1 VIEW Result Date: 06/10/2023 CLINICAL DATA:  5626 Acute respiratory failure (HCC) 5626 [id number] Pleural effusion [id number] EXAM: PORTABLE CHEST 1 VIEW COMPARISON:  Chest radiograph from one day prior. FINDINGS: Two pigtail lower left chest tubes are stable. Stable cardiomediastinal silhouette with normal heart size. No pneumothorax. Stable mild blunting of the left costophrenic angle. No significant right pleural effusion. Similar mild volume loss in the left hemithorax with mild streaky left parahilar  and left basilar opacities. IMPRESSION: 1. Stable left chest tubes. No pneumothorax. 2. Stable mild blunting of the left costophrenic angle, either scarring or residual small left pleural effusion. 3. Similar mild volume loss in the left hemithorax with mild streaky left parahilar and left basilar opacities. Electronically Signed   By: Delbert Phenix M.D.   On: 06/10/2023 10:52   DG CHEST PORT 1 VIEW Result Date: 06/09/2023 CLINICAL DATA:  Evaluate for recurrent pleural effusion EXAM: PORTABLE CHEST 1 VIEW COMPARISON:  06/08/2023 FINDINGS: Cardiac shadow is stable. Two pigtail catheters are again noted on the left. No pneumothorax is noted. Minimal blunting of the costophrenic angle is noted. No significant increase in pleural effusion is noted. Persistent left basilar atelectasis is noted. IMPRESSION: No significant interval change from the prior exam. No significant increase in pleural effusion is noted. Electronically Signed   By: Alcide Clever M.D.   On: 06/09/2023 10:11   DG Chest [facility] 1 View Result  Date: 06/08/2023 CLINICAL DATA:  Empyema. EXAM: PORTABLE CHEST 1 VIEW COMPARISON:  06/06/2023. FINDINGS: Redemonstration of heterogeneous opacification of the left hemithorax, overall decreased in density since the prior study, suggesting interval decrease in the loculated left pleural effusion, likely status post additional left pleural drainage placement. No pneumothorax. There also superimposed areas of atelectasis and or pneumonia. Please refer to recent chest CT scan for additional details. There are 2 left-sided pleural drainage catheters. Right lung is grossly clear. No dense consolidation or lung collapse. Right lateral costophrenic angle is clear. No pneumothorax. Stable cardio-mediastinal silhouette. No acute osseous abnormalities. The soft tissues are within normal limits. IMPRESSION: *Interval decrease in the loculated left pleural effusion, status post left-sided additional pleural drainage catheter  placement. In total, there are now 2 left-sided pleural drainage catheters. No pneumothorax seen. Electronically Signed   By: Jules Schick M.D.   On: 06/08/2023 15:53   CT [facility] PLEURAL DRAIN W/INDWELL CATH W/IMG GUIDE Result Date: 06/07/2023 INDICATION: 33 year old male with history of complex left empyema. EXAM: CT PERC PLEURAL DRAIN W/INDWELL CATH W/IMG GUIDE COMPARISON:  None Available. MEDICATIONS: The patient is currently admitted to the hospital and receiving intravenous antibiotics. The antibiotics were administered within an appropriate time frame prior to the initiation of the procedure. ANESTHESIA/SEDATION: Moderate (conscious) sedation was employed during this procedure. A total of Versed 1 mg and Fentanyl 50 mcg was administered intravenously. Moderate Sedation Time: 10 minutes. The patient's level of consciousness and vital signs were monitored continuously by radiology nursing throughout the procedure under my direct supervision. CONTRAST:  None COMPLICATIONS: None immediate. PROCEDURE: RADIATION DOSE REDUCTION: This exam was performed according to the departmental dose-optimization program which includes automated exposure control, adjustment of the mA and/or kV according to patient size and/or use of iterative reconstruction technique. Informed written consent was obtained from the patient after a discussion of the risks, benefits and alternatives to treatment. The patient was placed supine, partially right lateral decubitus on the CT gantry and a pre procedural CT was performed re-demonstrating the known fluid collection within the left pleural space. The procedure was planned. A timeout was performed prior to the initiation of the procedure. The left posterolateral and inferior thorax was prepped and draped in the usual sterile fashion. The overlying soft tissues were anesthetized with 1% lidocaine with epinephrine. Appropriate trajectory was planned with the use of a 22 gauge spinal needle. An  18 gauge trocar needle was advanced into the pleural space and a short Amplatz super stiff wire was coiled within the collection. Appropriate positioning was confirmed with a limited CT scan. The tract was serially dilated allowing placement of a 14 French all-purpose drainage catheter. Appropriate positioning was confirmed with a limited postprocedural CT scan. The tube was connected to a pleurovac and sutured in place. A dressing was placed. The patient tolerated the procedure well without immediate post procedural complication. IMPRESSION: Successful CT guided placement of a 68 French all purpose drain catheter into the posterolateral and inferior left pleural space with aspiration of serous fluid. Marliss Coots, MD [facility] Radiology Electronically Signed   By: Marliss Coots M.D.   On: 06/07/2023 16:53   CT MAXILLOFACIAL W CONTRAST Result Date: 06/06/2023 CLINICAL DATA:  Dental caries EXAM: CT MAXILLOFACIAL WITH CONTRAST TECHNIQUE: Multidetector CT imaging of the maxillofacial structures was performed with intravenous contrast. Multiplanar CT image reconstructions were also generated. RADIATION DOSE REDUCTION: This exam was performed according to the departmental dose-optimization program which includes automated exposure control,  adjustment of the mA and/or kV according to patient size and/or use of iterative reconstruction technique. CONTRAST:  75mL OMNIPAQUE IOHEXOL 350 MG/ML SOLN COMPARISON:  None Available. FINDINGS: Osseous: No traumatic facial bone finding. Dental: Unerupted right maxillary tooth 1. Multiple caries evident affecting the anterior maxillary dentition from tooth 4 through tooth 12. Lucency around the roots particularly at teeth 8, 9 and 10. With respect to the mandibular dentition, there is advanced decay with multiple caries. Unfortunately there is a good bit of motion degradation. Pronounced lucency adjacent to the roots of the left  mandibular dentition with a confluent root abscess in the right mandible affecting teeth 30 and 31. Orbits: Normal Sinuses: Clear Soft tissues: Negative Limited intracranial: Normal IMPRESSION: 1. Advanced dental and periodontal disease as outlined above. Electronically Signed   By: Paulina Fusi M.D.   On: 06/06/2023 22:39   CT CHEST WO CONTRAST Result Date: 06/06/2023 CLINICAL DATA:  Pneumonia complication EXAM: CT CHEST WITHOUT CONTRAST TECHNIQUE: Multidetector CT imaging of the chest was performed following the standard protocol without IV contrast. RADIATION DOSE REDUCTION: This exam was performed according to the departmental dose-optimization program which includes automated exposure control, adjustment of the mA and/or kV according to patient size and/or use of iterative reconstruction technique. COMPARISON:  CT abdomen 06/02/2023 FINDINGS: Cardiovascular: Heart size normal. Blood pool is hypodense compared to the interventricular septum suggesting anemia. Trace pericardial fluid. Central great vessels unremarkable. Mediastinum/Nodes: Anterior mediastinal soft tissue density presumably residual thymic tissue. No definite mass or adenopathy, sensitivity decreased without contrast. Lungs/Pleura: Progressive moderate partially loculated left pleural effusion despite placement of pigtail chest tube anteromedially at the lung base. Scattered loculated pleural gas bubbles may be related to chest tube placement versus infection. Persistent dense consolidation of the left lower lobe. Worsening dependent consolidation/atelectasis in the lingula and left upper lobe. Small right pleural effusion has developed. Consolidation with air bronchograms posteriorly in the right lower lobe. Subsegmental consolidation/atelectasis anteromedially in the right upper lobe. Upper Abdomen: No acute findings. Musculoskeletal: No chest wall mass or suspicious bone lesions identified. IMPRESSION: 1. Progressive moderate partially  loculated left pleural effusion despite placement of pigtail chest tube. 2. Worsening dependent consolidation/atelectasis in the lingula and left upper lobe. 3. Persistent dense consolidation of the left lower lobe. 4. New small right pleural effusion. 5. New consolidation/atelectasis posteriorly in the right lower lobe. Electronically Signed   By: Corlis Leak M.D.   On: 06/06/2023 16:52   DG Chest Port 1V same Day Result Date: 06/06/2023 CLINICAL DATA:  [id number] SOB (shortness of breath) [id number] EXAM: PORTABLE CHEST 1 VIEW COMPARISON:  Chest x-ray 06/05/2023, CT abdomen pelvis 06/02/2023 FINDINGS: Left chest tube with pigtail overlying the lower medial left hemithorax. The heart and mediastinal contours are within normal limits. Left lung airspace opacity. No pulmonary edema. Interval increase in size of a loculated moderate to large volume left pleural effusion. No right pleural effusion. No pneumothorax. No acute osseous abnormality. IMPRESSION: 1. Interval increase in size of a loculated moderate to large volume left pleural effusion. 2. Underlying left lung airspace opacity may represent combination of infection/inflammation and atelectasis. 3. Left chest tube stable. Electronically Signed   By: Tish Frederickson M.D.   On: 06/06/2023 07:59   DG CHEST PORT 1 VIEW Result Date: 06/05/2023 CLINICAL DATA:  Pain EXAM: PORTABLE CHEST 1 VIEW COMPARISON:  Chest x-ray 06/05/2023 FINDINGS: Left-sided chest tube is unchanged in position. Left pleural effusion which appears partially loculated is unchanged from prior. There is no pneumothorax.  There is a new band of opacity in the right suprahilar region. Patchy opacities in the left lower lung have increased. Cardiomediastinal silhouette is within normal limits. No acute fractures are seen. IMPRESSION: 1. Left-sided chest tube is unchanged in position. 2. Stable partially loculated left pleural effusion. 3. New band of opacity in the right suprahilar region. 4. Increasing  left basilar air atelectasis/airspace disease. Electronically Signed   By: Darliss Cheney M.D.   On: 06/05/2023 18:30   DG CHEST PORT 1 VIEW Result Date: 06/05/2023 CLINICAL DATA:  Chest tube in place. EXAM: PORTABLE CHEST 1 VIEW COMPARISON:  Radiographs 06/04/2023 and 06/03/2023. Abdominal CT 06/02/2023. FINDINGS: 0527 hours. Left pigtail chest tube is unchanged in position, overlying the medial left lung base. Lobulated left pleural effusion appears mildly decreased in volume, and there is improved aeration of the left lung. The right lung is clear. No evidence of pneumothorax. The visualized heart size and mediastinal contours are stable. IMPRESSION: Mild decrease in volume of left pleural effusion with improved aeration of the left lung. No evidence of pneumothorax. Electronically Signed   By: Carey Bullocks M.D.   On: 06/05/2023 10:12   ECHOCARDIOGRAM COMPLETE Result Date: 06/04/2023    ECHOCARDIOGRAM REPORT   Patient Name:   Khaleem Burchill Date of Exam: 06/04/2023 Medical Rec #:  [id number]    Height:       68.0 in Accession #:    [id number]   Weight:       130.1 lb Date of Birth:  Feb 26, 1991    BSA:          1.702 m Patient Age:    32 years     BP:           119/74 mmHg Patient Gender: M            HR:           111 bpm. Exam Location:  Inpatient Procedure: 2D Echo, Cardiac Doppler and Color Doppler Indications:    Endocarditis  History:        Patient has no prior history of Echocardiogram examinations.  Sonographer:    Amy Chionchio Referring Phys: Lorin Glass IMPRESSIONS  1. Left ventricular ejection fraction, by estimation, is 65 to 70%. Left ventricular ejection fraction by 2D MOD biplane is 68.5 %. The left ventricle has normal function. The left ventricle has no regional wall motion abnormalities. Left ventricular diastolic parameters were normal.  2. Right ventricular systolic function is normal. The right ventricular size is normal. Tricuspid regurgitation signal is inadequate for assessing PA  pressure.  3. The mitral valve is grossly normal. No evidence of mitral valve regurgitation.  4. The aortic valve was not well visualized. Aortic valve regurgitation is not visualized.  5. The inferior vena cava is normal in size with <50% respiratory variability, suggesting right atrial pressure of 8 mmHg. Comparison(s): No prior Echocardiogram. Conclusion(s)/Recommendation(s): No evidence of valvular vegetations on this transthoracic echocardiogram. Consider a transesophageal echocardiogram to exclude infective endocarditis if clinically indicated. FINDINGS  Left Ventricle: Left ventricular ejection fraction, by estimation, is 65 to 70%. Left ventricular ejection fraction by 2D MOD biplane is 68.5 %. The left ventricle has normal function. The left ventricle has no regional wall motion abnormalities. The left ventricular internal cavity size was normal in size. There is no left ventricular hypertrophy. Left ventricular diastolic parameters were normal. Right Ventricle: The right ventricular size is normal. No increase in right ventricular wall thickness. Right ventricular systolic function is normal.  Tricuspid regurgitation signal is inadequate for assessing PA pressure. Left Atrium: Left atrial size was normal in size. Right Atrium: Right atrial size was normal in size. Pericardium: There is no evidence of pericardial effusion. Mitral Valve: The mitral valve is grossly normal. No evidence of mitral valve regurgitation. MV peak gradient, 2.2 mmHg. The mean mitral valve gradient is 1.0 mmHg. Tricuspid Valve: The tricuspid valve is normal in structure. Tricuspid valve regurgitation is not demonstrated. Aortic Valve: The aortic valve was not well visualized. Aortic valve regurgitation is not visualized. Aortic valve mean gradient measures 5.0 mmHg. Aortic valve peak gradient measures 10.8 mmHg. Aortic valve area, by VTI measures 2.26 cm. Pulmonic Valve: The pulmonic valve was normal in structure. Pulmonic valve  regurgitation is not visualized. Aorta: The aortic root and ascending aorta are structurally normal, with no evidence of dilitation. Venous: The inferior vena cava is normal in size with less than 50% respiratory variability, suggesting right atrial pressure of 8 mmHg. IAS/Shunts: No atrial level shunt detected by color flow Doppler.  LEFT VENTRICLE PLAX 2D                        Biplane EF (MOD) LVIDd:         4.60 cm         LV Biplane EF:   Left LVIDs:         3.60 cm                          ventricular LV PW:         0.80 cm                          ejection LV IVS:        0.60 cm                          fraction by LVOT diam:     2.10 cm                          2D MOD LV SV:         46                               biplane is LV SV Index:   27                               68.5 %. LVOT Area:     3.46 cm                                Diastology                                LV e' medial:    11.20 cm/s LV Volumes (MOD)               LV E/e' medial:  7.7 LV vol d, MOD    101.0 ml      LV e' lateral:   17.00 cm/s A2C:  LV E/e' lateral: 5.1 LV vol d, MOD    126.0 ml A4C: LV vol s, MOD    25.3 ml A2C: LV vol s, MOD    50.4 ml A4C: LV SV MOD A2C:   75.7 ml LV SV MOD A4C:   126.0 ml LV SV MOD BP:    87.9 ml RIGHT VENTRICLE          IVC RV Basal diam:  2.70 cm  IVC diam: 1.50 cm TAPSE (M-mode): 2.3 cm LEFT ATRIUM             Index        RIGHT ATRIUM           Index LA Vol (A2C):   50.3 ml 29.55 ml/m  RA Area:     14.30 cm LA Vol (A4C):   44.7 ml 26.26 ml/m  RA Volume:   36.70 ml  21.56 ml/m LA Biplane Vol: 47.9 ml 28.14 ml/m  AORTIC VALVE                     PULMONIC VALVE AV Area (Vmax):    2.08 cm      PV Vmax:       0.96 m/s AV Area (Vmean):   2.31 cm      PV Peak grad:  3.6 mmHg AV Area (VTI):     2.26 cm AV Vmax:           164.00 cm/s AV Vmean:          107.000 cm/s AV VTI:            0.204 m AV Peak Grad:      10.8 mmHg AV Mean Grad:      5.0 mmHg LVOT Vmax:         98.40  cm/s LVOT Vmean:        71.300 cm/s LVOT VTI:          0.133 m LVOT/AV VTI ratio: 0.65  AORTA Ao Root diam: 3.30 cm Ao Asc diam:  2.90 cm MITRAL VALVE MV Area (PHT): 5.46 cm    SHUNTS MV Area VTI:   2.99 cm    Systemic VTI:  0.13 m MV Peak grad:  2.2 mmHg    Systemic Diam: 2.10 cm MV Mean grad:  1.0 mmHg MV Vmax:       0.75 m/s MV Vmean:      59.1 cm/s MV Decel Time: 139 msec MV E velocity: 86.40 cm/s MV A velocity: 71.10 cm/s MV E/A ratio:  1.22 Zoila Shutter MD Electronically signed by Zoila Shutter MD Signature Date/Time: 06/04/2023/1:37:10 PM    Final    DG Chest Port 1 View Result Date: 06/04/2023 CLINICAL DATA:  Chest tube in place EXAM: PORTABLE CHEST 1 VIEW COMPARISON:  Yesterday FINDINGS: Numerous leads and wires project over the chest. Minimal tracheal deviation to the right. Mild cardiomegaly. A left-sided pleural pigtail catheter is unchanged in position inferiorly. No significant change in moderate left pleural effusion with extensive loculation superiorly and laterally. Relatively diffuse left-sided airspace disease is also similar. The right lung remains clear. IMPRESSION: Relatively similar appearance of loculated left-sided pleural fluid and diffuse left-sided airspace disease. Left-sided pleural catheter remaining in place. Electronically Signed   By: Jeronimo Greaves M.D.   On: 06/04/2023 09:34   DG Chest Port 1 View Result Date: 06/03/2023 CLINICAL DATA:  Chest tube in place. EXAM: PORTABLE CHEST 1 VIEW COMPARISON:  Same day. FINDINGS: Stable cardiomediastinal  silhouette. Right lung is clear. Interval placement of pleural drainage catheter in the left lung base. Left pleural effusion is significantly enlarged compared to prior exam with left upper lobe atelectasis or infiltrate. IMPRESSION: Interval placement of pleural drainage catheter into the left lung base. Left pleural effusion is significantly enlarged compared to prior exam with left upper lobe atelectasis or infiltrate. Electronically  Signed   By: Lupita Raider M.D.   On: 06/03/2023 16:59   DG Chest 2 View Result Date: 06/03/2023 CLINICAL DATA:  Cough EXAM: CHEST - 2 VIEW COMPARISON:  04/10/2020 FINDINGS: Loculated left pleural effusion with left lower lobe consolidation. Mild vascular congestion of the right lung. Normal cardiomediastinal contours. IMPRESSION: Loculated left pleural effusion with left lower lobe consolidation, concerning for pneumonia. Electronically Signed   By: Deatra Robinson M.D.   On: 06/03/2023 02:20   CT ABDOMEN PELVIS W CONTRAST Result Date: 06/02/2023 CLINICAL DATA:  Left flank pain EXAM: CT ABDOMEN AND PELVIS WITH CONTRAST TECHNIQUE: Multidetector CT imaging of the abdomen and pelvis was performed using the standard protocol following bolus administration of intravenous contrast. RADIATION DOSE REDUCTION: This exam was performed according to the departmental dose-optimization program which includes automated exposure control, adjustment of the mA and/or kV according to patient size and/or use of iterative reconstruction technique. CONTRAST:  OMNIPAQUE IOHEXOL 300 MG/ML  SOLN COMPARISON:  09/25/2018 FINDINGS: Lower Chest: Loculated left pleural effusion with left lower lobe consolidation. Small area of right middle lobe consolidation anteriorly. Hepatobiliary: Normal hepatic contours. No intra- or extrahepatic biliary dilatation. The gallbladder is normal. Pancreas: Normal pancreas. No ductal dilatation or peripancreatic fluid collection. Spleen: Normal. Adrenals/Urinary Tract: The adrenal glands are normal. No hydronephrosis, nephroureterolithiasis or solid renal mass. The urinary bladder is normal for degree of distention Stomach/Bowel: There is no hiatal hernia. Normal duodenal course and caliber. No small bowel dilatation or inflammation. No focal colonic abnormality. Normal appendix. Vascular/Lymphatic: Normal course and caliber of the major abdominal vessels. No abdominal or pelvic lymphadenopathy.  Reproductive: Normal prostate size with symmetric seminal vesicles. Other: None. Musculoskeletal: No bony spinal canal stenosis or focal osseous abnormality. IMPRESSION: 1. Loculated left pleural effusion with left lower lobe consolidation, concerning for post-obstructive pneumonia. 2. Small area of right middle lobe consolidation anteriorly, also concerning for pneumonia. 3. No acute abnormality of the abdomen or pelvis. Electronically Signed   By: Deatra Robinson M.D.   On: 06/02/2023 23:53   Assessment/Plan # Empyema lung - 2/9 CT chest with improved findings - clinically improving  - continue Augmentin as planned, EOT 3/5 pending Chest xray  - Chest xray today - Fu in 2 weeks - Fu with Pulmonary as planned  - Discussed to fu with dentist  # Chronic HCV - CBC, CMP, PT/INR, genotype, fibrotest, US abdomen w elastography  - Fu pending above for tx planning  # Need for Immunization - will check Hep A total ab to see if needs to be vaccinated   # Substance abuse  - claims to be clean since discharge, counseled   I have personally spent at least 60 minutes involved in face-to-face and non-face-to-face activities for this patient on the day of the visit. Professional time spent includes the following activities: Preparing to see the patient (review of tests), Obtaining and/or reviewing separately obtained history (admission/discharge record), Performing a medically appropriate examination and/or evaluation , Ordering medications/tests/procedures, referring and communicating with other health care professionals, Documenting clinical information in the EMR, Independently interpreting results (not separately reported), Communicating results  to the patient/family/caregiver, Counseling and educating the patient/family/caregiver and Care coordination (not separately reported).   Of note, portions of this note may have been created with voice recognition software. While this note has been edited for  accuracy, occasional wrong-word or 'sound-a-like' substitutions may have occurred due to the inherent limitations of voice recognition software.   Victoriano Lain, MD [facility] 06/22/2023, 1:58 PM

## 2023-06-23 DIAGNOSIS — K056 Periodontal disease, unspecified: Secondary | ICD-10-CM | POA: Insufficient documentation

## 2023-06-26 ENCOUNTER — Telehealth: Payer: Self-pay

## 2023-06-26 NOTE — Telephone Encounter (Signed)
-----   Message from Victoriano Lain sent at 06/23/2023  8:20 AM EST ----- Labs reviewed Mild thrombocytosis, likely reactive Mild elevation in liver enzymes in the setting of known Hepatitis C and augmentin use. No abdominal symptoms. Will monitor for now.   All labs have not resulted yet.

## 2023-06-26 NOTE — Telephone Encounter (Signed)
 Patient called office, relayed that platelets were elevated and liver enzymes were mildly elevated in the setting of known Hep C. Discussed that not all labs have come back yet.   Sandie Ano, RN

## 2023-06-28 LAB — CBC
HCT: 35.6 % — ABNORMAL LOW (ref 38.5–50.0)
Hemoglobin: 11.6 g/dL — ABNORMAL LOW (ref 13.2–17.1)
MCH: 30.5 pg (ref 27.0–33.0)
MCHC: 32.6 g/dL (ref 32.0–36.0)
MCV: 93.7 fL (ref 80.0–100.0)
MPV: 8.8 fL (ref 7.5–12.5)
Platelets: 536 10*3/uL — ABNORMAL HIGH (ref 140–400)
RBC: 3.8 10*6/uL — ABNORMAL LOW (ref 4.20–5.80)
RDW: 13.4 % (ref 11.0–15.0)
WBC: 8.4 10*3/uL (ref 3.8–10.8)

## 2023-06-28 LAB — COMPREHENSIVE METABOLIC PANEL
AG Ratio: 0.9 (calc) — ABNORMAL LOW (ref 1.0–2.5)
ALT: 68 U/L — ABNORMAL HIGH (ref 9–46)
AST: 53 U/L — ABNORMAL HIGH (ref 10–40)
Albumin: 3.7 g/dL (ref 3.6–5.1)
Alkaline phosphatase (APISO): 89 U/L (ref 36–130)
BUN: 14 mg/dL (ref 7–25)
CO2: 28 mmol/L (ref 20–32)
Calcium: 9.3 mg/dL (ref 8.6–10.3)
Chloride: 103 mmol/L (ref 98–110)
Creat: 0.61 mg/dL (ref 0.60–1.26)
Globulin: 3.9 g/dL — ABNORMAL HIGH (ref 1.9–3.7)
Glucose, Bld: 99 mg/dL (ref 65–99)
Potassium: 4.3 mmol/L (ref 3.5–5.3)
Sodium: 139 mmol/L (ref 135–146)
Total Bilirubin: 0.2 mg/dL (ref 0.2–1.2)
Total Protein: 7.6 g/dL (ref 6.1–8.1)

## 2023-06-28 LAB — LIVER FIBROSIS, FIBROTEST-ACTITEST
ALT: 68 U/L — ABNORMAL HIGH (ref 9–46)
Alpha-2-Macroglobulin: 197 mg/dL (ref 106–279)
Apolipoprotein A1: 197 mg/dL — ABNORMAL HIGH (ref 94–176)
Bilirubin: 0.2 mg/dL (ref 0.2–1.2)
Fibrosis Score: 0.02
GGT: 16 U/L (ref 3–90)
Haptoglobin: 406 mg/dL — ABNORMAL HIGH (ref 43–212)
Necroinflammat ACT Score: 0.31
Reference ID: 5355155

## 2023-06-28 LAB — PROTIME-INR
INR: 0.9
Prothrombin Time: 10.1 s (ref 9.0–11.5)

## 2023-06-28 LAB — HEPATITIS C GENOTYPE: HCV Genotype: 3

## 2023-06-28 LAB — HEPATITIS A ANTIBODY, TOTAL: Hepatitis A AB,Total: REACTIVE — AB

## 2023-07-04 ENCOUNTER — Telehealth: Payer: Self-pay

## 2023-07-04 DIAGNOSIS — J869 Pyothorax without fistula: Secondary | ICD-10-CM

## 2023-07-04 LAB — FUNGUS CULTURE RESULT

## 2023-07-04 LAB — FUNGUS CULTURE WITH STAIN

## 2023-07-04 LAB — FUNGAL ORGANISM REFLEX

## 2023-07-04 MED ORDER — AMOXICILLIN-POT CLAVULANATE 875-125 MG PO TABS: 1.0000 | ORAL_TABLET | Freq: Two times a day (BID) | ORAL | 0 refills | Status: AC

## 2023-07-04 NOTE — Telephone Encounter (Signed)
 Spoke with patient, relayed per Dr. Elinor Parkinson that chest xray shows overall improved findings. Relayed there was residual consolidation in the left lower lobe of the lung, but no new concerns for infection.   Discussed that provider is extending antibiotics for two weeks and that additional supply will be sent in.   He reports he has noticed slight pain when taking a deep breath in the left lower part of his lung. States it started two days ago "out of nowhere" and is in his "lower back where my lung is." Reports the pain is waking him up at night and describes it as feeling like "a little ball." Will route to provider.   Sandie Ano, RN

## 2023-07-04 NOTE — Telephone Encounter (Signed)
-----   Message from Victoriano Lain sent at 07/04/2023  3:07 PM EST ----- Regarding: Fu on CXR Please let patient know that chest xray with overall improved findings, residual consolidation in the left lower lobe of lung. But no new concerns for infection. His prior EOT was 3/5. Please send him 2 more weeks of augmentin 875/125 mg po bid to be started from 3/5.

## 2023-07-05 NOTE — Telephone Encounter (Signed)
 Thank you :)

## 2023-07-05 NOTE — Telephone Encounter (Signed)
 Spoke with patient, he is agreeable to come in to see Dr. Drue Second tomorrow 3/6. Relayed Dr. Elinor Parkinson would like for him to continue with Augmentin. Discussed that if he develops fever, shortness of breath, or chest pain that he should go to emergency department. Patient verbalized understanding and has no further questions.   Sandie Ano, RN

## 2023-07-06 ENCOUNTER — Ambulatory Visit: Payer: Self-pay | Admitting: Internal Medicine

## 2023-07-06 ENCOUNTER — Ambulatory Visit (HOSPITAL_COMMUNITY): Payer: Self-pay

## 2023-07-11 ENCOUNTER — Ambulatory Visit: Payer: Self-pay | Admitting: Infectious Diseases

## 2023-07-11 ENCOUNTER — Telehealth: Payer: Self-pay

## 2023-07-11 NOTE — Telephone Encounter (Signed)
 Patient missed appointment today. He also missed ultrasound appointment.   Sandie Ano, RN

## 2023-07-13 ENCOUNTER — Encounter: Payer: Self-pay | Admitting: Primary Care

## 2023-07-13 ENCOUNTER — Inpatient Hospital Stay: Payer: Self-pay | Admitting: Primary Care
